# Patient Record
Sex: Male | Born: 1955 | Race: Black or African American | Hispanic: No | Marital: Married | Smoking: Never smoker
Health system: Southern US, Community
[De-identification: ages and names within clinical notes are randomized; demographics above are authoritative.]

## PROBLEM LIST (undated history)

## (undated) DIAGNOSIS — M1990 Unspecified osteoarthritis, unspecified site: Secondary | ICD-10-CM

## (undated) DIAGNOSIS — N138 Other obstructive and reflux uropathy: Secondary | ICD-10-CM

## (undated) DIAGNOSIS — E785 Hyperlipidemia, unspecified: Secondary | ICD-10-CM

## (undated) DIAGNOSIS — F32A Depression, unspecified: Secondary | ICD-10-CM

## (undated) DIAGNOSIS — Z87442 Personal history of urinary calculi: Secondary | ICD-10-CM

## (undated) DIAGNOSIS — F419 Anxiety disorder, unspecified: Secondary | ICD-10-CM

## (undated) DIAGNOSIS — N200 Calculus of kidney: Secondary | ICD-10-CM

## (undated) DIAGNOSIS — I10 Essential (primary) hypertension: Secondary | ICD-10-CM

## (undated) DIAGNOSIS — K219 Gastro-esophageal reflux disease without esophagitis: Secondary | ICD-10-CM

## (undated) DIAGNOSIS — G4733 Obstructive sleep apnea (adult) (pediatric): Secondary | ICD-10-CM

## (undated) DIAGNOSIS — I700 Atherosclerosis of aorta: Secondary | ICD-10-CM

## (undated) DIAGNOSIS — N401 Enlarged prostate with lower urinary tract symptoms: Principal | ICD-10-CM

## (undated) DIAGNOSIS — N529 Male erectile dysfunction, unspecified: Secondary | ICD-10-CM

## (undated) DIAGNOSIS — N400 Enlarged prostate without lower urinary tract symptoms: Secondary | ICD-10-CM

## (undated) DIAGNOSIS — G4730 Sleep apnea, unspecified: Secondary | ICD-10-CM

## (undated) DIAGNOSIS — G43909 Migraine, unspecified, not intractable, without status migrainosus: Secondary | ICD-10-CM

## (undated) HISTORY — DX: Benign prostatic hyperplasia with lower urinary tract symptoms: N40.1

## (undated) HISTORY — DX: Unspecified osteoarthritis, unspecified site: M19.90

## (undated) HISTORY — PX: POLYPECTOMY: SHX149

## (undated) HISTORY — DX: Other obstructive and reflux uropathy: N13.8

## (undated) HISTORY — DX: Benign prostatic hyperplasia without lower urinary tract symptoms: N40.0

## (undated) HISTORY — PX: TONSILLECTOMY: SUR1361

## (undated) HISTORY — PX: HERNIA REPAIR: SHX51

---

## 1998-07-26 HISTORY — PX: COLONOSCOPY: SHX174

## 2007-09-06 ENCOUNTER — Ambulatory Visit: Payer: Self-pay

## 2009-03-27 ENCOUNTER — Ambulatory Visit: Payer: Self-pay | Admitting: Family Medicine

## 2010-03-19 ENCOUNTER — Ambulatory Visit: Payer: Self-pay | Admitting: Family Medicine

## 2012-06-14 ENCOUNTER — Ambulatory Visit: Payer: Self-pay | Admitting: General Surgery

## 2012-06-15 LAB — PATHOLOGY REPORT

## 2013-02-06 ENCOUNTER — Encounter: Payer: Self-pay | Admitting: *Deleted

## 2013-10-01 ENCOUNTER — Ambulatory Visit: Payer: Self-pay | Admitting: Family Medicine

## 2015-01-09 ENCOUNTER — Encounter: Payer: Self-pay | Admitting: Family Medicine

## 2015-01-13 ENCOUNTER — Ambulatory Visit (INDEPENDENT_AMBULATORY_CARE_PROVIDER_SITE_OTHER): Payer: BLUE CROSS/BLUE SHIELD | Admitting: Family Medicine

## 2015-01-13 ENCOUNTER — Encounter: Payer: Self-pay | Admitting: Family Medicine

## 2015-01-13 VITALS — BP 118/80 | HR 71 | Temp 98.1°F | Resp 16 | Ht 67.0 in | Wt 189.4 lb

## 2015-01-13 DIAGNOSIS — Z0000 Encounter for general adult medical examination without abnormal findings: Secondary | ICD-10-CM

## 2015-01-13 DIAGNOSIS — N400 Enlarged prostate without lower urinary tract symptoms: Secondary | ICD-10-CM

## 2015-01-13 DIAGNOSIS — Z713 Dietary counseling and surveillance: Secondary | ICD-10-CM | POA: Diagnosis not present

## 2015-01-13 HISTORY — DX: Benign prostatic hyperplasia without lower urinary tract symptoms: N40.0

## 2015-01-13 MED ORDER — TAMSULOSIN HCL 0.4 MG PO CAPS: 0.4000 mg | ORAL_CAPSULE | ORAL | Status: DC

## 2015-01-13 MED ORDER — CIPROFLOXACIN HCL 500 MG PO TABS: 500.0000 mg | ORAL_TABLET | Freq: Two times a day (BID) | ORAL | Status: DC

## 2015-01-13 NOTE — Patient Instructions (Signed)
Exercise to Lose Weight Exercise and a healthy diet may help you lose weight. Your doctor may suggest specific exercises. EXERCISE IDEAS AND TIPS  Choose low-cost things you enjoy doing, such as walking, bicycling, or exercising to workout videos.  Take stairs instead of the elevator.  Walk during your lunch break.  Park your car further away from work or school.  Go to a gym or an exercise class.  Start with 5 to 10 minutes of exercise each day. Build up to 30 minutes of exercise 4 to 6 days a week.  Wear shoes with good support and comfortable clothes.  Stretch before and after working out.  Work out until you breathe harder and your heart beats faster.  Drink extra water when you exercise.  Do not do so much that you hurt yourself, feel dizzy, or get very short of breath. Exercises that burn about 150 calories:  Running 1  miles in 15 minutes.  Playing volleyball for 45 to 60 minutes.  Washing and waxing a car for 45 to 60 minutes.  Playing touch football for 45 minutes.  Walking 1  miles in 35 minutes.  Pushing a stroller 1  miles in 30 minutes.  Playing basketball for 30 minutes.  Raking leaves for 30 minutes.  Bicycling 5 miles in 30 minutes.  Walking 2 miles in 30 minutes.  Dancing for 30 minutes.  Shoveling snow for 15 minutes.  Swimming laps for 20 minutes.  Walking up stairs for 15 minutes.  Bicycling 4 miles in 15 minutes.  Gardening for 30 to 45 minutes.  Jumping rope for 15 minutes.  Washing windows or floors for 45 to 60 minutes. Document Released: 08/14/2010 Document Revised: 10/04/2011 Document Reviewed: 08/14/2010 ExitCare Patient Information 2015 [employer]. This information is not intended to replace advice given to you by your health care provider. Make sure you discuss any questions you have with your health care provider.  

## 2015-01-13 NOTE — Progress Notes (Signed)
Name: Daniel Garner   MRN: [id number]    DOB: [birth year]   Date:01/13/2015       Progress Note  Subjective  Chief Complaint  Chief Complaint  Patient presents with  . Annual Exam    HPI  59 year old male presents for annual H&P. He has tomorrow arthritic changes but otherwise unremarkable  Past Medical History  Diagnosis Date  . Special screening for malignant neoplasms, colon   . Arthritis     History  Substance Use Topics  . Smoking status: Never Smoker   . Smokeless tobacco: Never Used  . Alcohol Use: No     Current outpatient prescriptions:  .  tamsulosin (FLOMAX) 0.4 MG CAPS capsule, , Disp: , Rfl:   No Known Allergies  Review of Systems  Constitutional: Negative for fever, chills and weight loss.  HENT: Negative for congestion, hearing loss, sore throat and tinnitus.   Eyes: Negative for blurred vision, double vision and redness.  Respiratory: Negative for cough, hemoptysis and shortness of breath.   Cardiovascular: Negative for chest pain, palpitations, orthopnea, claudication and leg swelling.  Gastrointestinal: Negative for heartburn, nausea, vomiting, diarrhea, constipation and blood in stool.  Genitourinary: Negative for dysuria, urgency, frequency and hematuria.  Musculoskeletal: Negative for myalgias, back pain, joint pain, falls and neck pain.  Skin: Negative for itching.  Neurological: Negative for dizziness, tingling, tremors, focal weakness, seizures, loss of consciousness, weakness and headaches.  Endo/Heme/Allergies: Does not bruise/bleed easily.  Psychiatric/Behavioral: Negative for depression and substance abuse. The patient is not nervous/anxious and does not have insomnia.      Objective  Filed Vitals:   01/13/15 1529  BP: 118/80  Pulse: 71  Temp: 98.1 F (36.7 C)  TempSrc: Oral  Resp: 16  Height: 5\' 7"  (1.702 m)  Weight: 189 lb 6.4 oz (85.911 kg)  SpO2: 99%     Physical Exam  Constitutional: He is oriented to person, place,  and time and well-developed, well-nourished, and in no distress.  HENT:  Head: Normocephalic.  Eyes: EOM are normal. Pupils are equal, round, and reactive to light.  Neck: Normal range of motion. Neck supple. No thyromegaly present.  Cardiovascular: Normal rate, regular rhythm and normal heart sounds.   No murmur heard. Pulmonary/Chest: Effort normal and breath sounds normal. No respiratory distress. He has no wheezes.  Abdominal: Soft. Bowel sounds are normal.  Genitourinary: Prostate is enlarged and tender.  Musculoskeletal: Normal range of motion. He exhibits no edema.  Lymphadenopathy:    He has no cervical adenopathy.  Neurological: He is alert and oriented to person, place, and time. No cranial nerve deficit. Gait normal. Coordination normal.  Skin: Skin is warm and dry. No rash noted.  Psychiatric: Affect and judgment normal.      Assessment & Plan  1. Annual physical exam  - Comprehensive metabolic panel - Lipid panel - TSH - CBC with Differential - PSA  2. Dietary counseling Patient handout is given  3. Prostatism  - ciprofloxacin (CIPRO) 500 MG tablet; Take 1 tablet (500 mg total) by mouth 2 (two) times daily.  Dispense: 20 tablet; Refill: 0 - tamsulosin (FLOMAX) 0.4 MG CAPS capsule; Take 1 capsule (0.4 mg total) by mouth 1 day or 1 dose.  Dispense: 30 capsule; Refill: 5

## 2015-01-14 ENCOUNTER — Encounter: Payer: Self-pay | Admitting: Family Medicine

## 2015-01-21 ENCOUNTER — Telehealth: Payer: Self-pay | Admitting: Family Medicine

## 2015-01-21 NOTE — Telephone Encounter (Signed)
Pt tried to take Tamsulosin prescription to [facility] and they would not fill it. Told him that the directions on the prescription was wrong. Please call pharmacy

## 2015-01-23 NOTE — Telephone Encounter (Signed)
Patient got script straight with [facility]

## 2015-01-30 ENCOUNTER — Telehealth: Payer: Self-pay | Admitting: Family Medicine

## 2015-01-30 DIAGNOSIS — N400 Enlarged prostate without lower urinary tract symptoms: Secondary | ICD-10-CM

## 2015-01-30 MED ORDER — CIPROFLOXACIN HCL 500 MG PO TABS: 500.0000 mg | ORAL_TABLET | Freq: Two times a day (BID) | ORAL | Status: DC

## 2015-01-30 NOTE — Telephone Encounter (Signed)
ERRENOUS °

## 2015-01-30 NOTE — Telephone Encounter (Signed)
Requesting refill on Citrofloxacn 500mg . Please call once completed.

## 2015-01-30 NOTE — Telephone Encounter (Signed)
Sent refill to pt pharmacy that is on file pt aware.

## 2015-05-22 ENCOUNTER — Ambulatory Visit: Payer: Self-pay | Admitting: General Surgery

## 2015-05-30 ENCOUNTER — Ambulatory Visit: Payer: BLUE CROSS/BLUE SHIELD | Admitting: Family Medicine

## 2015-06-10 ENCOUNTER — Encounter: Payer: Self-pay | Admitting: General Surgery

## 2015-06-10 ENCOUNTER — Ambulatory Visit (INDEPENDENT_AMBULATORY_CARE_PROVIDER_SITE_OTHER): Payer: BLUE CROSS/BLUE SHIELD | Admitting: General Surgery

## 2015-06-10 VITALS — BP 124/76 | HR 74 | Resp 12 | Ht 70.0 in | Wt 191.0 lb

## 2015-06-10 DIAGNOSIS — Z86010 Personal history of colonic polyps: Secondary | ICD-10-CM

## 2015-06-10 DIAGNOSIS — Z1211 Encounter for screening for malignant neoplasm of colon: Secondary | ICD-10-CM | POA: Diagnosis not present

## 2015-06-10 MED ORDER — POLYETHYLENE GLYCOL 3350 17 GM/SCOOP PO POWD: 1.0000 | Freq: Once | ORAL | Status: DC

## 2015-06-10 NOTE — Patient Instructions (Addendum)
Colonoscopy A colonoscopy is an exam to look at the entire large intestine (colon). This exam can help find problems such as tumors, polyps, inflammation, and areas of bleeding. The exam takes about 1 hour.  LET [facility] CARE PROVIDER KNOW ABOUT:   Any allergies you have.  All medicines you are taking, including vitamins, herbs, eye drops, creams, and over-the-counter medicines.  Previous problems you or members of your family have had with the use of anesthetics.  Any blood disorders you have.  Previous surgeries you have had.  Medical conditions you have. RISKS AND COMPLICATIONS  Generally, this is a safe procedure. However, as with any procedure, complications can occur. Possible complications include:  Bleeding.  Tearing or rupture of the colon wall.  Reaction to medicines given during the exam.  Infection (rare). BEFORE THE PROCEDURE   Ask your health care provider about changing or stopping your regular medicines.  You may be prescribed an oral bowel prep. This involves drinking a large amount of medicated liquid, starting the day before your procedure. The liquid will cause you to have multiple loose stools until your stool is almost clear or light green. This cleans out your colon in preparation for the procedure.  Do not eat or drink anything else once you have started the bowel prep, unless your health care provider tells you it is safe to do so.  Arrange for someone to drive you home after the procedure. PROCEDURE   You will be given medicine to help you relax (sedative).  You will lie on your side with your knees bent.  A long, flexible tube with a light and camera on the end (colonoscope) will be inserted through the rectum and into the colon. The camera sends video back to a computer screen as it moves through the colon. The colonoscope also releases carbon dioxide gas to inflate the colon. This helps your health care provider see the area better.  During  the exam, your health care provider may take a small tissue sample (biopsy) to be examined under a microscope if any abnormalities are found.  The exam is finished when the entire colon has been viewed. AFTER THE PROCEDURE   Do not drive for 24 hours after the exam.  You may have a small amount of blood in your stool.  You may pass moderate amounts of gas and have mild abdominal cramping or bloating. This is caused by the gas used to inflate your colon during the exam.  Ask when your test results will be ready and how you will get your results. Make sure you get your test results.   This information is not intended to replace advice given to you by your health care provider. Make sure you discuss any questions you have with your health care provider.   Document Released: 07/09/2000 Document Revised: 05/02/2013 Document Reviewed: 03/19/2013 Elsevier Interactive Patient Education [employer].  Patient is scheduled for a colonoscopy at [facility] on 07/23/15. He is aware to stop his Fish Oil one week prior. Miralax prescription has been sent into his pharmacy. Patient is aware of date and instructions.

## 2015-06-10 NOTE — Progress Notes (Addendum)
Patient ID: Daniel Garner, male   DOB: 03-26-1956, 59 y.o.   MRN: [id number]  Chief Complaint  Patient presents with  . Colonoscopy    HPI Daniel Garner is a 59 y.o. male here today for a evaluation of a colonoscopy. Last one was done on 05/14/12. No GI problems at this time. At the time of last colonoscopy, 2 polyps were found and only one was retrieved. 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  I have reviewed the history of present illness with the patient. HPI  Past Medical History  Diagnosis Date  . Special screening for malignant neoplasms, colon   . Arthritis     Past Surgical History  Procedure Laterality Date  . Hernia repair  [id number]  . Colonoscopy  2000  . Tonsillectomy      Family History  Problem Relation Age of Onset  . Hypertension Mother     Social History Social History  Substance Use Topics  . Smoking status: Never Smoker   . Smokeless tobacco: Never Used  . Alcohol Use: No    No Known Allergies  Current Outpatient Prescriptions  Medication Sig Dispense Refill  . Fish Oil OIL 1 capsule by Does not apply route daily.    . tamsulosin (FLOMAX) 0.4 MG CAPS capsule Take 1 capsule (0.4 mg total) by mouth 1 day or 1 dose. 30 capsule 5  . polyethylene glycol powder (GLYCOLAX/MIRALAX) powder Take 255 g by mouth once. 255 g 0   No current facility-administered medications for this visit.    Review of Systems Review of Systems  Constitutional: Negative.   Respiratory: Negative.   Cardiovascular: Negative.     Blood pressure 124/76, pulse 74, resp. rate 12, height 5\' 10"  (1.778 m), weight 191 lb (86.637 kg).  Physical Exam Physical Exam  Constitutional: He is oriented to person, place, and time. He appears well-developed and well-nourished.  HENT:  Head: Normocephalic.  Eyes: Conjunctivae are normal.  Neck: Neck supple.  Cardiovascular: Normal rate, regular rhythm and normal heart sounds.   Pulmonary/Chest: Effort normal and breath  sounds normal.  Abdominal: Soft. Bowel sounds are normal. He exhibits no distension and no mass. There is no tenderness.  Neurological: He is alert and oriented to person, place, and time.  Skin: Skin is warm and dry.  Psychiatric: He has a normal mood and affect. His behavior is normal.    Data Reviewed Notes reviewed  Assessment    Stable exam, hx of colon polyps      Plan    Colonoscopy with possible biopsy/polypectomy prn: Information regarding the procedure, including its potential risks and complications (including but not limited to perforation of the bowel, which may require emergency surgery to repair, and bleeding) was verbally given to the patient. Educational information regarding lower intestinal endoscopy was given to the patient. Written instructions for how to complete the bowel prep using Miralax were provided. The importance of drinking ample fluids to avoid dehydration as a result of the prep emphasized.     Patient is scheduled for a colonoscopy at [facility] on 07/23/15. He is aware to stop his Fish Oil one week prior. Miralax prescription has been sent into his pharmacy. Patient is aware of date and instructions.   PCP:  Kathlene November 06/11/2015, 11:39 AM

## 2015-06-11 ENCOUNTER — Encounter: Payer: Self-pay | Admitting: General Surgery

## 2015-06-12 ENCOUNTER — Ambulatory Visit (INDEPENDENT_AMBULATORY_CARE_PROVIDER_SITE_OTHER): Payer: BLUE CROSS/BLUE SHIELD | Admitting: Family Medicine

## 2015-06-12 ENCOUNTER — Encounter: Payer: Self-pay | Admitting: Family Medicine

## 2015-06-12 VITALS — BP 124/78 | HR 82 | Temp 98.3°F | Resp 18 | Ht 70.0 in | Wt 193.1 lb

## 2015-06-12 DIAGNOSIS — N410 Acute prostatitis: Secondary | ICD-10-CM | POA: Diagnosis not present

## 2015-06-12 DIAGNOSIS — N138 Other obstructive and reflux uropathy: Secondary | ICD-10-CM

## 2015-06-12 DIAGNOSIS — N401 Enlarged prostate with lower urinary tract symptoms: Secondary | ICD-10-CM

## 2015-06-12 MED ORDER — CIPROFLOXACIN HCL 500 MG PO TABS: 500.0000 mg | ORAL_TABLET | Freq: Two times a day (BID) | ORAL | Status: DC

## 2015-06-12 NOTE — Progress Notes (Signed)
Name: Daniel Garner   MRN: [id number]    DOB: 12-May-1956   Date:06/12/2015       Progress Note  Subjective  Chief Complaint  Chief Complaint  Patient presents with  . Benign Prostatic Hypertrophy    HPI  Prostatitis and BPH  Patient has a history over several months of recurrent episodes of prostatitis with lower urinary tracts symptoms. He is convinced her course of antibiotics in the past and also has been placed on Flomax. He also now is experiencing some episodes of decreased ejaculation or failure to ejaculate with intercourse and orgasm. He has nocturia 2. There's been no hematuria  Past Medical History  Diagnosis Date  . Special screening for malignant neoplasms, colon   . Arthritis     Social History  Substance Use Topics  . Smoking status: Never Smoker   . Smokeless tobacco: Never Used  . Alcohol Use: No     Current outpatient prescriptions:  .  Fish Oil OIL, 1 capsule by Does not apply route daily., Disp: , Rfl:  .  polyethylene glycol powder (GLYCOLAX/MIRALAX) powder, Take 255 g by mouth once., Disp: 255 g, Rfl: 0 .  tamsulosin (FLOMAX) 0.4 MG CAPS capsule, Take 1 capsule (0.4 mg total) by mouth 1 day or 1 dose., Disp: 30 capsule, Rfl: 5  No Known Allergies  Review of Systems  Constitutional: Negative.   HENT: Negative.   Cardiovascular: Negative.   Genitourinary: Positive for dysuria, urgency and frequency. Negative for hematuria.       Ejaculatory problems  Skin: Negative.      Objective  Filed Vitals:   06/12/15 1433  BP: 124/78  Pulse: 82  Temp: 98.3 F (36.8 C)  Resp: 18  Height: 5\' 10"  (1.778 m)  Weight: 193 lb 1 oz (87.573 kg)  SpO2: 96%     Physical Exam  Constitutional: He is well-developed, well-nourished, and in no distress.  HENT:  Head: Normocephalic.  Eyes: Pupils are equal, round, and reactive to light.      Assessment & Plan  1. Acute prostatitis No bordering on becoming chronic chronic - POCT Urinalysis  Dipstick - Ambulatory referral to Urology - Urine Culture  2. BPH with obstruction/lower urinary tract symptoms Refer urologist continue Flomax

## 2015-06-14 LAB — URINE CULTURE: Organism ID, Bacteria: NO GROWTH

## 2015-06-15 DIAGNOSIS — N138 Other obstructive and reflux uropathy: Secondary | ICD-10-CM | POA: Insufficient documentation

## 2015-06-15 DIAGNOSIS — N401 Enlarged prostate with lower urinary tract symptoms: Secondary | ICD-10-CM

## 2015-06-15 HISTORY — DX: Other obstructive and reflux uropathy: N13.8

## 2015-06-15 HISTORY — DX: Benign prostatic hyperplasia with lower urinary tract symptoms: N40.1

## 2015-06-17 ENCOUNTER — Encounter: Payer: Self-pay | Admitting: Urology

## 2015-06-17 ENCOUNTER — Ambulatory Visit: Payer: BLUE CROSS/BLUE SHIELD | Admitting: Urology

## 2015-07-09 ENCOUNTER — Encounter: Payer: Self-pay | Admitting: Urology

## 2015-07-09 ENCOUNTER — Ambulatory Visit (INDEPENDENT_AMBULATORY_CARE_PROVIDER_SITE_OTHER): Payer: BLUE CROSS/BLUE SHIELD | Admitting: Urology

## 2015-07-09 VITALS — BP 129/81 | HR 80 | Ht 70.0 in | Wt 188.1 lb

## 2015-07-09 DIAGNOSIS — N401 Enlarged prostate with lower urinary tract symptoms: Secondary | ICD-10-CM

## 2015-07-09 DIAGNOSIS — N138 Other obstructive and reflux uropathy: Secondary | ICD-10-CM

## 2015-07-09 LAB — URINALYSIS, COMPLETE
BILIRUBIN UA: NEGATIVE
Glucose, UA: NEGATIVE
KETONES UA: NEGATIVE
LEUKOCYTES UA: NEGATIVE
NITRITE UA: NEGATIVE
PH UA: 5.5 (ref 5.0–7.5)
Protein, UA: NEGATIVE
RBC UA: NEGATIVE
Specific Gravity, UA: >1.03 — ABNORMAL HIGH (ref 1.005–1.030)
Urobilinogen, Ur: 1 mg/dL (ref 0.2–1.0)

## 2015-07-09 LAB — MICROSCOPIC EXAMINATION
BACTERIA UA: NONE SEEN
Epithelial Cells (non renal): NONE SEEN /hpf (ref 0–10)

## 2015-07-09 LAB — BLADDER SCAN AMB NON-IMAGING: Scan Result: 24

## 2015-07-09 MED ORDER — SILODOSIN 8 MG PO CAPS: 8.0000 mg | ORAL_CAPSULE | Freq: Every day | ORAL | Status: DC

## 2015-07-09 MED ORDER — SOLIFENACIN SUCCINATE 5 MG PO TABS
5.0000 mg | ORAL_TABLET | Freq: Every day | ORAL | Status: DC
Start: 2015-07-09 — End: 2015-12-04

## 2015-07-09 NOTE — Progress Notes (Signed)
07/09/2015 3:48 PM   Daniel Garner 03-04-1956 [id number]  Referring provider: Ashok Norris, MD [address] [place], [place] [address]  Chief Complaint  Patient presents with  . New Patient (Initial Visit)    acute prostatitis     HPI:  The patient is a 59 year old gentleman the past medical history that includes acute prostatitis and BPH.   The patient complains of incomplete emptying, frequency twice per hour, intermittency, and weak stream. He also is nocturia 2. He has been on Flomax for  3 months.   Over the last 6 months, he has been on 3 cycles of antibiotics for 2 weeks. He feels this and improved his symptoms more than Flomax. He notes his symptoms worsen when he stops antibiotics. His IPSS is 16/4.  He also notes that over the last year his ejaculate volume has decreased. He is still able to maintain erection orgasm, but he feels the volume is decreased. He feels the volume began decreasing before starting the Flomax.  His PVR today is 21.  PMH: Past Medical History  Diagnosis Date  . Special screening for malignant neoplasms, colon   . Arthritis   . 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 Prostatism 01/13/2015  . Annual physical exam 01/13/2015    Surgical History: Past Surgical History  Procedure Laterality Date  . Hernia repair  [id number]  . Colonoscopy  2000  . Tonsillectomy      Home Medications:    Medication List       This list is accurate as of: 07/09/15  3:48 PM.  Always use your most recent med list.               ciprofloxacin 500 MG tablet  Commonly known as:  CIPRO  Take 1 tablet (500 mg total) by mouth 2 (two) times daily.     Fish Oil Oil  1 capsule by Does not apply route daily.     ibuprofen 800 MG tablet  Commonly known as:  ADVIL,MOTRIN  800 mg     oxyCODONE-acetaminophen 5-325 MG tablet  Commonly known as:  PERCOCET/ROXICET  Reported on 07/09/2015     polyethylene glycol powder  powder  Commonly known as:  GLYCOLAX/MIRALAX  Take 255 g by mouth once.     silodosin 8 MG Caps capsule  Commonly known as:  RAPAFLO  Take 1 capsule (8 mg total) by mouth daily with breakfast.     solifenacin 5 MG tablet  Commonly known as:  VESICARE  Take 1 tablet (5 mg total) by mouth daily.     tamsulosin 0.4 MG Caps capsule  Commonly known as:  FLOMAX  Take 1 capsule (0.4 mg total) by mouth 1 day or 1 dose.        Allergies: No Known Allergies  Family History: Family History  Problem Relation Age of Onset  . Hypertension Mother   . Prostate cancer Father     Social History:  reports that he has never smoked. He has never used smokeless tobacco. He reports that he does not drink alcohol or use illicit drugs.  ROS: UROLOGY Frequent Urination?: Yes Hard to postpone urination?: No Burning/pain with urination?: No Get up at night to urinate?: Yes Leakage of urine?: No Urine stream starts and stops?: Yes Trouble starting stream?: Yes Do you have to strain to urinate?: No Blood in urine?: No Urinary tract infection?: No Sexually transmitted disease?: No Injury to kidneys or bladder?: No Painful intercourse?:  No Weak stream?: Yes Erection problems?: Yes Penile pain?: No  Gastrointestinal Nausea?: No Vomiting?: No Indigestion/heartburn?: No Diarrhea?: No Constipation?: No  Constitutional Fever: No Night sweats?: Yes Weight loss?: No Fatigue?: Yes  Skin Skin rash/lesions?: No Itching?: No  Eyes Blurred vision?: No Double vision?: No  Ears/Nose/Throat Sore throat?: No Sinus problems?: Yes  Hematologic/Lymphatic Swollen glands?: No Easy bruising?: No  Cardiovascular Leg swelling?: No Chest pain?: No  Respiratory Cough?: No Shortness of breath?: No  Endocrine Excessive thirst?: No  Musculoskeletal Back pain?: No Joint pain?: No  Neurological Headaches?: No Dizziness?: No  Psychologic Depression?: No Anxiety?: No  Physical  Exam: BP 129/81 mmHg  Pulse 80  Ht 5\' 10"  (1.778 m)  Wt 188 lb 1.6 oz (85.322 kg)  BMI 26.99 kg/m2  Constitutional:  Alert and oriented, No acute distress. HEENT: [place] AT, moist mucus membranes.  Trachea midline, no masses. Cardiovascular: No clubbing, cyanosis, or edema. Respiratory: Normal respiratory effort, no increased work of breathing. GI: Abdomen is soft, nontender, nondistended, no abdominal masses GU: No CVA tenderness.  Normal phallus. Testicles and equal bilaterally. No masses. DRE: 2+ , smooth, no nodules. Nontender to palpation. Skin: No rashes, bruises or suspicious lesions. Lymph: No cervical or inguinal adenopathy. Neurologic: Grossly intact, no focal deficits, moving all 4 extremities. Psychiatric: Normal mood and affect.  Laboratory Data: No results found for: WBC, HGB, HCT, MCV, PLT  No results found for: CREATININE  No results found for: PSA  No results found for: TESTOSTERONE  No results found for: HGBA1C  Urinalysis No results found for: COLORURINE, APPEARANCEUR, LABSPEC, PHURINE, GLUCOSEU, HGBUR, BILIRUBINUR, KETONESUR, PROTEINUR, UROBILINOGEN, NITRITE, LEUKOCYTESUR   Assessment & Plan:   1. BPH with obstruction/lower urinary tract symptoms  We will stop the patient's Flomax at this time As is the likely cause of his decreased ejaculate down. We switched him to Rapaflo 8 mg to see if this improves his sperm volume. He was given samples and prescription was sent to his pharmacy.  2. Overactive bladder   The patient also has significant frequency and a feeling of incomplete emptying. He was given samples of Vesicare 5 mg daily. We will also call a prescription in to his pharmacy.  3. Decreased ejaculate volume  This is likely secondary to the Flomax.  4.  PSA screening The patient was diagnosed recently with acute prostatitis by his primary care doctor. He has no symptoms of acute prostatitis at this time, but.we will hold off on screening his PSA now  as it may be falsely elevated from his recent prostatitis.  Return in about 3 months (around 10/07/2015).  Nickie Retort, MD  [facility] [address] [place], [place] [address] [phone]

## 2015-07-09 NOTE — Progress Notes (Signed)
Bladder Scan Patient : 24 ml Performed By: Larna Daughters

## 2015-08-06 ENCOUNTER — Ambulatory Visit: Payer: BLUE CROSS/BLUE SHIELD | Admitting: Certified Registered Nurse Anesthetist

## 2015-08-06 ENCOUNTER — Encounter: Payer: Self-pay | Admitting: *Deleted

## 2015-08-06 ENCOUNTER — Ambulatory Visit
Admission: RE | Admit: 2015-08-06 | Discharge: 2015-08-06 | Disposition: A | Discharge status: Home / Self Care | Payer: BLUE CROSS/BLUE SHIELD | Source: Ambulatory Visit | Attending: General Surgery | Admitting: General Surgery

## 2015-08-06 ENCOUNTER — Encounter
Admission: RE | Disposition: A | Discharge status: Home / Self Care | Payer: Self-pay | Source: Ambulatory Visit | Attending: General Surgery

## 2015-08-06 ENCOUNTER — Telehealth: Payer: Self-pay | Admitting: Urology

## 2015-08-06 DIAGNOSIS — M1990 Unspecified osteoarthritis, unspecified site: Secondary | ICD-10-CM | POA: Insufficient documentation

## 2015-08-06 DIAGNOSIS — N400 Enlarged prostate without lower urinary tract symptoms: Secondary | ICD-10-CM | POA: Insufficient documentation

## 2015-08-06 DIAGNOSIS — K621 Rectal polyp: Secondary | ICD-10-CM | POA: Insufficient documentation

## 2015-08-06 DIAGNOSIS — Z8042 Family history of malignant neoplasm of prostate: Secondary | ICD-10-CM | POA: Insufficient documentation

## 2015-08-06 DIAGNOSIS — Z79899 Other long term (current) drug therapy: Secondary | ICD-10-CM | POA: Diagnosis not present

## 2015-08-06 DIAGNOSIS — Z86010 Personal history of colonic polyps: Secondary | ICD-10-CM | POA: Diagnosis present

## 2015-08-06 DIAGNOSIS — Z1211 Encounter for screening for malignant neoplasm of colon: Secondary | ICD-10-CM | POA: Diagnosis present

## 2015-08-06 HISTORY — DX: Sleep apnea, unspecified: G47.30

## 2015-08-06 HISTORY — PX: COLONOSCOPY WITH PROPOFOL: SHX5780

## 2015-08-06 SURGERY — COLONOSCOPY WITH PROPOFOL
Anesthesia: General

## 2015-08-06 MED ORDER — PROPOFOL 500 MG/50ML IV EMUL
INTRAVENOUS | Status: DC | PRN
Start: 2015-08-06 — End: 2015-08-06
  Administered 2015-08-06: 140 ug/kg/min via INTRAVENOUS

## 2015-08-06 MED ORDER — LIDOCAINE HCL (CARDIAC) 20 MG/ML IV SOLN
INTRAVENOUS | Status: DC | PRN
  Administered 2015-08-06: 100 mg via INTRAVENOUS

## 2015-08-06 MED ORDER — FENTANYL CITRATE (PF) 100 MCG/2ML IJ SOLN: 25.0000 ug | INTRAMUSCULAR | Status: DC | PRN

## 2015-08-06 MED ORDER — SODIUM CHLORIDE 0.9 % IV SOLN
INTRAVENOUS | Status: DC | PRN
  Administered 2015-08-06: 08:00:00 via INTRAVENOUS

## 2015-08-06 MED ORDER — PROPOFOL 10 MG/ML IV BOLUS
INTRAVENOUS | Status: DC | PRN
  Administered 2015-08-06 (×3): 20 mg via INTRAVENOUS

## 2015-08-06 MED ORDER — MIDAZOLAM HCL 2 MG/2ML IJ SOLN
INTRAMUSCULAR | Status: DC | PRN
  Administered 2015-08-06: 1 mg via INTRAVENOUS

## 2015-08-06 MED ORDER — ONDANSETRON HCL 4 MG/2ML IJ SOLN: 4.0000 mg | Freq: Once | INTRAMUSCULAR | Status: DC | PRN

## 2015-08-06 NOTE — Interval H&P Note (Signed)
History and Physical Interval Note:  08/06/2015 9:14 AM  Daniel Garner  has presented today for surgery, with the diagnosis of HX COLON POLYPS  The various methods of treatment have been discussed with the patient and family. After consideration of risks, benefits and other options for treatment, the patient has consented to  Procedure(s): COLONOSCOPY WITH PROPOFOL (N/A) as a surgical intervention .  The patient's history has been reviewed, patient examined, no change in status, stable for surgery.  I have reviewed the patient's chart and labs.  Questions were answered to the patient's satisfaction.     Daniel Garner G

## 2015-08-06 NOTE — Anesthesia Postprocedure Evaluation (Signed)
Anesthesia Post Note  Patient: Daniel Garner  Procedure(s) Performed: Procedure(s) (LRB): COLONOSCOPY WITH PROPOFOL (N/A)  Patient location during evaluation: PACU Anesthesia Type: General Level of consciousness: awake and alert and oriented Pain management: pain level controlled Vital Signs Assessment: post-procedure vital signs reviewed and stable Respiratory status: spontaneous breathing Cardiovascular status: blood pressure returned to baseline Anesthetic complications: no    Last Vitals:  Filed Vitals:   08/06/15 1015 08/06/15 1025  BP: 109/68 101/75  Pulse: 63 54  Temp:    Resp: 13 14    Last Pain: There were no vitals filed for this visit.               Arik Husmann

## 2015-08-06 NOTE — Telephone Encounter (Signed)
Pt went to [facility] on [address] to pick up his Vesicare.  [facility] told him that he needs authorization for the generic Vesicare in order for him to have it filled.  Pt stopped by our office to talk with the nurse regarding this and also was asking for samples to have until his RX was cleared.  Please give pt a call to let him know when the authorization has been completed.

## 2015-08-06 NOTE — Transfer of Care (Signed)
Immediate Anesthesia Transfer of Care Note  Patient: Daniel Garner  Procedure(s) Performed: Procedure(s): COLONOSCOPY WITH PROPOFOL (N/A)  Patient Location: PACU  Anesthesia Type:General  Level of Consciousness: sedated  Airway & Oxygen Therapy: Patient Spontanous Breathing and Patient connected to nasal cannula oxygen  Post-op Assessment: Report given to RN and Post -op Vital signs reviewed and stable  Post vital signs: Reviewed and stable  Last Vitals:  Filed Vitals:   08/06/15 0827 08/06/15 0955  BP: 129/77 90/62  Pulse: 66 65  Temp: 35.9 C 36.1 C  Resp: 20 15    Complications: No apparent anesthesia complications

## 2015-08-06 NOTE — Op Note (Signed)
[facility] Patient Name: Daniel Garner Procedure Date: 08/06/2015 9:13 AM MRN: [id number] Account #: [id number] Date of Birth: 1956/05/19 Admit Type: Outpatient Age: 60 Room: [facility] ROOM 1 Gender: Male Note Status: Finalized Procedure:         Colonoscopy Indications:       High risk colon cancer surveillance: Personal history of                     colonic polyps Providers:         Mirenda Baltazar G. Jamal Collin, MD Referring MD:      Ashok Norris, MD (Referring MD) Medicines:         General Anesthesia Complications:     No immediate complications. Procedure:         Pre-Anesthesia Assessment:                    - General anesthesia under the supervision of an                     anesthesiologist was determined to be medically necessary                     for this procedure based on review of the patient's                     medical history, medications, and prior anesthesia history.                    After obtaining informed consent, the colonoscope was                     passed under direct vision. Throughout the procedure, the                     patient's blood pressure, pulse, and oxygen saturations                     were monitored continuously. The Olympus CF-H180AL                     colonoscope ( S#: [id number] ) was introduced through the                     anus and advanced to the the cecum, identified by the                     ileocecal valve. The colonoscopy was performed without                     difficulty. The patient tolerated the procedure well. The                     quality of the bowel preparation was good. Findings:      The perianal and digital rectal examinations were normal.      A 3 mm benign appearing polyp was found in the rectum. The polyp was       sessile. The polyp was removed with a cold biopsy forceps. Resection and       retrieval were complete.      The exam was otherwise without abnormality on direct  and retroflexion       views. Impression:        - One benign appearing 3 mm polyp in the rectum. Resected  and retrieved.                    - The examination was otherwise normal on direct and                     retroflexion views. Recommendation:    - Repeat colonoscopy in 5 years for surveillance. Procedure Code(s): --- Professional ---                    [phone], Colonoscopy, flexible; with biopsy, single or                     multiple Diagnosis Code(s): --- Professional ---                    Z86.010, Personal history of colonic polyps                    K62.1, Rectal polyp CPT copyright 2014 [employer]. All rights reserved. The codes documented in this report are preliminary and upon coder review may  be revised to meet current compliance requirements. Christene Lye, MD 08/06/2015 9:51:46 AM This report has been signed electronically. Number of Addenda: 0 Note Initiated On: 08/06/2015 9:13 AM Scope Withdrawal Time: 0 hours 10 minutes 33 seconds  Total Procedure Duration: 0 hours 24 minutes 6 seconds       [facility]

## 2015-08-06 NOTE — Anesthesia Procedure Notes (Signed)
Date/Time: 08/06/2015 9:20 AM Performed by: Johnna Acosta Pre-anesthesia Checklist: Patient identified, Emergency Drugs available, Suction available, Patient being monitored and Timeout performed Patient Re-evaluated:Patient Re-evaluated prior to inductionOxygen Delivery Method: Nasal cannula

## 2015-08-06 NOTE — H&P (Signed)
Daniel Garner is an 60 y.o. male.   Chief Complaint: pt here for scheduled colonoscopy HPI: 60yr old male with history of colon polyps. Here for surveillance colonoscopy. No GI complaints. Please see prior OV note from Nov 2016. He reports no changes in his meds and allergies.   Past Medical History  Diagnosis Date  . Special screening for malignant neoplasms, colon   . Arthritis   . 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 Prostatism 01/13/2015  . Annual physical exam 01/13/2015  . Sleep apnea     Past Surgical History  Procedure Laterality Date  . Hernia repair  [id number]  . Colonoscopy  2000  . Tonsillectomy      Family History  Problem Relation Age of Onset  . Hypertension Mother   . Prostate cancer Father    Social History:  reports that he has never smoked. He has never used smokeless tobacco. He reports that he does not drink alcohol or use illicit drugs.  Allergies: No Known Allergies  Medications Prior to Admission  Medication Sig Dispense Refill  . ciprofloxacin (CIPRO) 500 MG tablet Take 1 tablet (500 mg total) by mouth 2 (two) times daily. (Patient not taking: Reported on 07/09/2015) 28 tablet 0  . Fish Oil OIL 1 capsule by Does not apply route daily.    Marland Kitchen ibuprofen (ADVIL,MOTRIN) 800 MG tablet 800 mg    . oxyCODONE-acetaminophen (PERCOCET/ROXICET) 5-325 MG tablet Reported on 07/09/2015    . polyethylene glycol powder (GLYCOLAX/MIRALAX) powder Take 255 g by mouth once. (Patient not taking: Reported on 07/09/2015) 255 g 0  . silodosin (RAPAFLO) 8 MG CAPS capsule Take 1 capsule (8 mg total) by mouth daily with breakfast. 30 capsule 11  . solifenacin (VESICARE) 5 MG tablet Take 1 tablet (5 mg total) by mouth daily. 30 tablet 11  . tamsulosin (FLOMAX) 0.4 MG CAPS capsule Take 1 capsule (0.4 mg total) by mouth 1 day or 1 dose. (Patient not taking: Reported on 08/06/2015) 30 capsule 5    No results found for this or any previous visit (from the  past 48 hour(s)). No results found.  Review of Systems  Constitutional: Negative.   HENT: Negative.   Respiratory: Negative.   Cardiovascular: Negative.   Gastrointestinal: Negative.   Genitourinary: Negative.     Blood pressure 129/77, pulse 66, temperature 96.7 F (35.9 C), temperature source Tympanic, resp. rate 20, height 5\' 10"  (1.778 m), weight 185 lb (83.915 kg), SpO2 100 %. Physical Exam  Constitutional: He appears well-developed and well-nourished.  Eyes: Conjunctivae are normal. No scleral icterus.  Neck: Neck supple.  Cardiovascular: Normal rate, regular rhythm and normal heart sounds.   Respiratory: Effort normal and breath sounds normal.  GI: Soft. Bowel sounds are normal. There is no hepatomegaly. There is no tenderness. No hernia.     Assessment/Plan Personal history of colon polyps. Colonoscopy with possible biopsy/polypectomy prn: Information regarding the procedure, including its potential risks and complications (including but not limited to perforation of the bowel, which may require emergency surgery to repair, and bleeding) was verbally given to the patient.    [facility] G 08/06/2015, 9:10 AM

## 2015-08-06 NOTE — Telephone Encounter (Signed)
Attempted to call [facility] on [address]. And there was no answer.

## 2015-08-06 NOTE — Anesthesia Preprocedure Evaluation (Signed)
Anesthesia Evaluation  Patient identified by MRN, date of birth, ID band Patient awake    Reviewed: Allergy & Precautions, NPO status , Patient's Chart, lab work & pertinent test results  Airway Mallampati: III  TM Distance: <3 FB Neck ROM: Full    Dental  (+) Chipped   Pulmonary sleep apnea ,    Pulmonary exam normal breath sounds clear to auscultation       Cardiovascular negative cardio ROS Normal cardiovascular exam     Neuro/Psych negative neurological ROS  negative psych ROS   GI/Hepatic negative GI ROS, Neg liver ROS,   Endo/Other  negative endocrine ROS  Renal/GU negative Renal ROS  negative genitourinary   Musculoskeletal  (+) Arthritis , Osteoarthritis,    Abdominal Normal abdominal exam  (+)   Peds negative pediatric ROS (+)  Hematology negative hematology ROS (+)   Anesthesia Other Findings   Reproductive/Obstetrics                             Anesthesia Physical Anesthesia Plan  ASA: II  Anesthesia Plan: General   Post-op Pain Management:    Induction: Intravenous  Airway Management Planned: Nasal Cannula  Additional Equipment:   Intra-op Plan:   Post-operative Plan:   Informed Consent: I have reviewed the patients History and Physical, chart, labs and discussed the procedure including the risks, benefits and alternatives for the proposed anesthesia with the patient or authorized representative who has indicated his/her understanding and acceptance.   Dental advisory given  Plan Discussed with: CRNA and Surgeon  Anesthesia Plan Comments:         Anesthesia Quick Evaluation

## 2015-08-07 ENCOUNTER — Encounter: Payer: Self-pay | Admitting: General Surgery

## 2015-08-07 LAB — SURGICAL PATHOLOGY

## 2015-08-15 NOTE — Telephone Encounter (Signed)
Completed PA for vesicare.

## 2015-08-26 ENCOUNTER — Encounter: Payer: Self-pay | Admitting: General Surgery

## 2015-08-28 ENCOUNTER — Telehealth: Payer: Self-pay

## 2015-08-28 DIAGNOSIS — N3281 Overactive bladder: Secondary | ICD-10-CM

## 2015-08-28 NOTE — Telephone Encounter (Signed)
Pt called stating he spoke with you out in the hall when he came to get vesicare samples-insurance will not pay for vesicare. Pt stated you said you would call in a different medication. Medication was not called in. Please advise.

## 2015-08-28 NOTE — Telephone Encounter (Signed)
We can try ditropan xl 10 mg once daily. thanks

## 2015-08-29 MED ORDER — OXYBUTYNIN CHLORIDE ER 10 MG PO TB24: 10.0000 mg | ORAL_TABLET | Freq: Every day | ORAL | Status: DC

## 2015-08-29 NOTE — Telephone Encounter (Signed)
LMOM-a new medication sent to pharmacy.

## 2015-09-11 ENCOUNTER — Ambulatory Visit (INDEPENDENT_AMBULATORY_CARE_PROVIDER_SITE_OTHER): Payer: BLUE CROSS/BLUE SHIELD | Admitting: Urology

## 2015-09-11 VITALS — Ht 70.0 in | Wt 192.0 lb

## 2015-09-11 DIAGNOSIS — Z125 Encounter for screening for malignant neoplasm of prostate: Secondary | ICD-10-CM | POA: Diagnosis not present

## 2015-09-11 DIAGNOSIS — N401 Enlarged prostate with lower urinary tract symptoms: Secondary | ICD-10-CM | POA: Diagnosis not present

## 2015-09-11 DIAGNOSIS — R868 Other abnormal findings in specimens from male genital organs: Secondary | ICD-10-CM

## 2015-09-11 DIAGNOSIS — N538 Other male sexual dysfunction: Secondary | ICD-10-CM

## 2015-09-11 DIAGNOSIS — N138 Other obstructive and reflux uropathy: Secondary | ICD-10-CM

## 2015-09-11 DIAGNOSIS — N3281 Overactive bladder: Secondary | ICD-10-CM | POA: Diagnosis not present

## 2015-09-11 LAB — BLADDER SCAN AMB NON-IMAGING

## 2015-09-11 NOTE — Progress Notes (Signed)
09/11/2015 12:01 PM   Daniel Garner 10-24-1955 [id number]  Referring provider: Ashok Norris, MD [address] [place], [place] [address]  Chief Complaint  Patient presents with  . Benign Prostatic Hypertrophy    follow up    HPI: The patient is a 60 year old gentleman the past medical history that includes acute prostatitis and BPH. The patient complains of incomplete emptying, frequency twice per hour, intermittency, and weak stream. He also is nocturia 2. He has been on Flomax for 3 months. Over the last 6 months, he has been on 3 cycles of antibiotics for 2 weeks. He feels this and improved his symptoms more than Flomax. He notes his symptoms worsen when he stops antibiotics. His IPSS is 16/4. He also notes that over the last year his ejaculate volume has decreased. He is still able to maintain erection orgasm, but he feels the volume is decreased. He feels the volume began decreasing before starting the Flomax. His PVR today is 21.  DRE was 2+, benign.  February 2017 interval history: The patient was started on Rapaflo and Vesicare at his last appointment. However, his insurance does not pay for the Vesicare so he was started on Ditropan XL. The patient's main concern again today is decreased sperm volume. The patient was switched from Flomax to Rapaflo and his last appointment hoping that this will help him. His base complaint is that when he does not ejaculate that he is able to region orgasm but he has pain in his right testicle. He states when he does ejaculate when he feels a normal mouth does not have this pain and experiences relief. He finds is very bothersome. His urinary symptoms are also bothersome but not as bothersome. His I PSS score today is 16/4. He has issues with incomplete emptying, frequency, intermittency, urgency, weak stream, and nocturia 2. He is mostly satisfied with his symptoms. However again today he is more concerned with his low  ejaculate.  PVR: 0 cc PMH: Past Medical History  Diagnosis Date  . Special screening for malignant neoplasms, colon   . Arthritis   . 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 Prostatism 01/13/2015  . Annual physical exam 01/13/2015  . Sleep apnea     Surgical History: Past Surgical History  Procedure Laterality Date  . Hernia repair  [id number]  . Colonoscopy  2000  . Tonsillectomy    . Colonoscopy with propofol N/A 08/06/2015    Procedure: COLONOSCOPY WITH PROPOFOL;  Surgeon: Christene Lye, MD;  Location: [facility] ENDOSCOPY;  Service: Endoscopy;  Laterality: N/A;    Home Medications:    Medication List       This list is accurate as of: 09/11/15 12:01 PM.  Always use your most recent med list.               Fish Oil Oil  1 capsule by Does not apply route daily.     ibuprofen 800 MG tablet  Commonly known as:  ADVIL,MOTRIN  800 mg     oxybutynin 10 MG 24 hr tablet  Commonly known as:  DITROPAN-XL  Take 1 tablet (10 mg total) by mouth daily.     silodosin 8 MG Caps capsule  Commonly known as:  RAPAFLO  Take 1 capsule (8 mg total) by mouth daily with breakfast.     solifenacin 5 MG tablet  Commonly known as:  VESICARE  Take 1 tablet (5 mg total) by mouth daily.  Allergies: No Known Allergies  Family History: Family History  Problem Relation Age of Onset  . Hypertension Mother   . Prostate cancer Father     Social History:  reports that he has never smoked. He has never used smokeless tobacco. He reports that he does not drink alcohol or use illicit drugs.  ROS: UROLOGY Frequent Urination?: No Hard to postpone urination?: No Burning/pain with urination?: No Get up at night to urinate?: No Leakage of urine?: No Urine stream starts and stops?: No Trouble starting stream?: No Do you have to strain to urinate?: No Blood in urine?: No Urinary tract infection?: No Sexually transmitted disease?: No Injury to kidneys or  bladder?: No Painful intercourse?: No Weak stream?: No Erection problems?: No Penile pain?: No  Gastrointestinal Nausea?: No Vomiting?: No Indigestion/heartburn?: No Diarrhea?: No Constipation?: No  Constitutional Fever: No Night sweats?: No Weight loss?: No Fatigue?: Yes  Skin Skin rash/lesions?: No Itching?: No  Eyes Blurred vision?: No Double vision?: No  Ears/Nose/Throat Sore throat?: No Sinus problems?: Yes  Hematologic/Lymphatic Swollen glands?: No Easy bruising?: No  Cardiovascular Leg swelling?: No Chest pain?: No  Respiratory Cough?: No Shortness of breath?: No  Endocrine Excessive thirst?: No  Musculoskeletal Back pain?: No Joint pain?: No  Neurological Headaches?: No Dizziness?: No  Psychologic Depression?: No Anxiety?: No  Physical Exam: Ht 5\' 10"  (1.778 m)  Wt 192 lb (87.091 kg)  BMI 27.55 kg/m2  Constitutional:  Alert and oriented, No acute distress. HEENT: [place] AT, moist mucus membranes.  Trachea midline, no masses. Cardiovascular: No clubbing, cyanosis, or edema. Respiratory: Normal respiratory effort, no increased work of breathing. GI: Abdomen is soft, nontender, nondistended, no abdominal masses GU: No CVA tenderness. Normal phallus. Testicles descended equal bilaterally. Nontender palpation. No masses. Skin: No rashes, bruises or suspicious lesions. Lymph: No cervical or inguinal adenopathy. Neurologic: Grossly intact, no focal deficits, moving all 4 extremities. Psychiatric: Normal mood and affect.  Laboratory Data: No results found for: WBC, HGB, HCT, MCV, PLT  No results found for: CREATININE  No results found for: PSA  No results found for: TESTOSTERONE  No results found for: HGBA1C  Urinalysis    Component Value Date/Time   GLUCOSEU Negative 07/09/2015 1503   BILIRUBINUR Negative 07/09/2015 1503   NITRITE Negative 07/09/2015 1503   LEUKOCYTESUR Negative 07/09/2015 1503    Assessment & Plan:    1. BPH  with obstruction/lower urinary tract symptoms The patient will stop his Rapaflo due to his significant concern with decreased ejaculate volume. He was warned that his urinary symptoms may worsen. If this were to occur, he was instructed to restart his Rapaflo.  2. Overactive bladder -continue ditropan XL for significant urgency and frequency in the setting of an empty bladder  3. Decreased ejaculate volume This is likely secondary to the rapaflo as above  4. PSA screening The patient was diagnosed recently with acute prostatitis by his primary care doctor. He has no symptoms of acute prostatitis at this time, but.we will hold off on screening his PSA now as it may be falsely elevated from his recent prostatitis.  Consider checking PSA at next appointment   Return in about 3 months (around 12/09/2015).  Nickie Retort, MD  [facility] [address], [place], [place] [address] [phone]

## 2015-09-19 ENCOUNTER — Ambulatory Visit: Payer: BLUE CROSS/BLUE SHIELD

## 2015-11-06 ENCOUNTER — Ambulatory Visit: Payer: BLUE CROSS/BLUE SHIELD

## 2015-12-04 ENCOUNTER — Ambulatory Visit (INDEPENDENT_AMBULATORY_CARE_PROVIDER_SITE_OTHER): Payer: BLUE CROSS/BLUE SHIELD | Admitting: Family Medicine

## 2015-12-04 ENCOUNTER — Encounter: Payer: Self-pay | Admitting: Family Medicine

## 2015-12-04 VITALS — BP 122/78 | HR 79 | Temp 97.5°F | Resp 16 | Ht 70.0 in | Wt 195.8 lb

## 2015-12-04 DIAGNOSIS — M25561 Pain in right knee: Secondary | ICD-10-CM | POA: Diagnosis not present

## 2015-12-04 MED ORDER — ASPIRIN EC 81 MG PO TBEC: 81.0000 mg | DELAYED_RELEASE_TABLET | Freq: Every day | ORAL | Status: DC

## 2015-12-04 MED ORDER — DICLOFENAC SODIUM 75 MG PO TBEC: 75.0000 mg | DELAYED_RELEASE_TABLET | Freq: Two times a day (BID) | ORAL | Status: DC

## 2015-12-04 NOTE — Progress Notes (Signed)
Name: Daniel Garner   MRN: [id number]    DOB: 1955/12/30   Date:12/04/2015       Progress Note  Subjective  Chief Complaint  Chief Complaint  Patient presents with  . Knee Pain    patient presents with intermittent right knee pain that started about 2-3 months ago. patient stated that it has some slight swelling and pressure. patient stated that the pain is predominantly the left lateral side of the patella. patient has tried otc ibuprofen and elevation at night.    HPI  Right knee pain: he likes to run and usually runs three times weekly either 3 miles outdoors or at a faster pace 2 miles treadmill. About 2 months ago he woke up with pain on right anterior knee and had a mild effusion. He has been taking ibuprofen occasionally. He stopped running, but has been walking and lifting weights. 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 He still has an antalgic gait. Pain is mild and intermittent now, but aggravated by activity.   Patient Active Problem List   Diagnosis Date Noted  . 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 Prostatism 01/13/2015    Past Surgical History  Procedure Laterality Date  . Hernia repair  [id number]  . Colonoscopy  2000  . Tonsillectomy    . Colonoscopy with propofol N/A 08/06/2015    Procedure: COLONOSCOPY WITH PROPOFOL;  Surgeon: Christene Lye, MD;  Location: [facility] ENDOSCOPY;  Service: Endoscopy;  Laterality: N/A;    Family History  Problem Relation Age of Onset  . Hypertension Mother   . Prostate cancer Father     Social History   Social History  . Marital Status: Married    Spouse Name: N/A  . Number of Children: N/A  . Years of Education: N/A   Occupational History  . Not on file.   Social History Main Topics  . Smoking status: Never Smoker   . Smokeless tobacco: Never Used  . Alcohol Use: No  . Drug Use: No  . Sexual  Activity:    Partners: Female   Other Topics Concern  . Not on file   Social History Narrative     Current outpatient prescriptions:  .  Cholecalciferol (VITAMIN D-3) 1000 units CAPS, Take by mouth., Disp: , Rfl:  .  cyanocobalamin 1000 MCG tablet, Take 1,000 mcg by mouth daily., Disp: , Rfl:  .  Fish Oil OIL, 1 capsule by Does not apply route daily., Disp: , Rfl:  .  vitamin C (ASCORBIC ACID) 500 MG tablet, Take 500 mg by mouth daily., Disp: , Rfl:  .  vitamin E 400 UNIT capsule, Take 400 Units by mouth 2 (two) times daily., Disp: , Rfl:  .  aspirin EC 81 MG tablet, Take 1 tablet (81 mg total) by mouth daily., Disp: 30 tablet, Rfl: 0 .  diclofenac (VOLTAREN) 75 MG EC tablet, Take 1 tablet (75 mg total) by mouth 2 (two) times daily., Disp: 60 tablet, Rfl: 0  No Known Allergies   ROS  Ten systems reviewed and is negative except as mentioned in HPI  Objective  Filed Vitals:   12/04/15 1149  BP: 122/78  Pulse: 79  Temp: 97.5 F (36.4 C)  TempSrc: Oral  Resp: 16  Height: 5\' 10"  (1.778 m)  Weight: 195 lb 12.8 oz (88.814 kg)  SpO2: 97%    Body mass index  is 28.09 kg/(m^2).  Physical Exam  Constitutional: Patient appears well-developed and well-nourished.  No distress.  HEENT: head atraumatic, normocephalic, pupils equal and reactive to light,  neck supple, throat within normal limits Cardiovascular: Normal rate, regular rhythm and normal heart sounds.  No murmur heard. No BLE edema. Pulmonary/Chest: Effort normal and breath sounds normal. No respiratory distress. Abdominal: Soft.  There is no tenderness. Psychiatric: Patient has a normal mood and affect. behavior is normal. Judgment and thought content normal. Muscular skeletal: mild crepitus with extension of left knee, normal right knee exam, mild antalgic gait.   Recent Results (from the past 2160 hour(s))  BLADDER SCAN AMB NON-IMAGING     Status: None   Collection Time: 09/11/15 11:31 AM  Result Value Ref Range    Scan Result 36ml      PHQ2/9: Depression screen PHQ 2/9 12/04/2015  Decreased Interest 0  Down, Depressed, Hopeless 0  PHQ - 2 Score 0    Fall Risk: Fall Risk  12/04/2015  Falls in the past year? No    Functional Status Survey: Is the patient deaf or have difficulty hearing?: No Does the patient have difficulty seeing, even when wearing glasses/contacts?: No Does the patient have difficulty concentrating, remembering, or making decisions?: No Does the patient have difficulty walking or climbing stairs?: No Does the patient have difficulty dressing or bathing?: No Does the patient have difficulty doing errands alone such as visiting a doctor's office or shopping?: No    Assessment & Plan  1. Right knee pain  We will try nsaid's, and avoid high impact activities of lower extremity. He can still walk, use ice on knee at night, and try medication, if no improvement call back for referral to Ortho - diclofenac (VOLTAREN) 75 MG EC tablet; Take 1 tablet (75 mg total) by mouth 2 (two) times daily.  Dispense: 60 tablet; Refill: 0

## 2016-02-16 DIAGNOSIS — G4733 Obstructive sleep apnea (adult) (pediatric): Secondary | ICD-10-CM | POA: Diagnosis not present

## 2016-03-05 DIAGNOSIS — G4733 Obstructive sleep apnea (adult) (pediatric): Secondary | ICD-10-CM | POA: Diagnosis not present

## 2016-03-25 DIAGNOSIS — G4733 Obstructive sleep apnea (adult) (pediatric): Secondary | ICD-10-CM | POA: Diagnosis not present

## 2016-05-19 DIAGNOSIS — G4733 Obstructive sleep apnea (adult) (pediatric): Secondary | ICD-10-CM | POA: Diagnosis not present

## 2016-08-16 ENCOUNTER — Encounter: Payer: Self-pay | Admitting: Family Medicine

## 2016-08-16 ENCOUNTER — Ambulatory Visit (INDEPENDENT_AMBULATORY_CARE_PROVIDER_SITE_OTHER): Payer: BLUE CROSS/BLUE SHIELD | Admitting: Family Medicine

## 2016-08-16 VITALS — BP 124/78 | HR 91 | Temp 97.9°F | Resp 16 | Ht 70.0 in | Wt 205.6 lb

## 2016-08-16 DIAGNOSIS — M25561 Pain in right knee: Secondary | ICD-10-CM

## 2016-08-16 DIAGNOSIS — Z125 Encounter for screening for malignant neoplasm of prostate: Secondary | ICD-10-CM

## 2016-08-16 DIAGNOSIS — E663 Overweight: Secondary | ICD-10-CM | POA: Diagnosis not present

## 2016-08-16 DIAGNOSIS — K219 Gastro-esophageal reflux disease without esophagitis: Secondary | ICD-10-CM | POA: Diagnosis not present

## 2016-08-16 DIAGNOSIS — Z131 Encounter for screening for diabetes mellitus: Secondary | ICD-10-CM | POA: Diagnosis not present

## 2016-08-16 DIAGNOSIS — G8929 Other chronic pain: Secondary | ICD-10-CM

## 2016-08-16 DIAGNOSIS — M94262 Chondromalacia, left knee: Secondary | ICD-10-CM | POA: Diagnosis not present

## 2016-08-16 DIAGNOSIS — R5383 Other fatigue: Secondary | ICD-10-CM

## 2016-08-16 DIAGNOSIS — Z1159 Encounter for screening for other viral diseases: Secondary | ICD-10-CM

## 2016-08-16 DIAGNOSIS — N401 Enlarged prostate with lower urinary tract symptoms: Secondary | ICD-10-CM

## 2016-08-16 DIAGNOSIS — Z23 Encounter for immunization: Secondary | ICD-10-CM

## 2016-08-16 DIAGNOSIS — G4733 Obstructive sleep apnea (adult) (pediatric): Secondary | ICD-10-CM

## 2016-08-16 DIAGNOSIS — M1990 Unspecified osteoarthritis, unspecified site: Secondary | ICD-10-CM | POA: Insufficient documentation

## 2016-08-16 DIAGNOSIS — Z13220 Encounter for screening for lipoid disorders: Secondary | ICD-10-CM

## 2016-08-16 DIAGNOSIS — G4730 Sleep apnea, unspecified: Secondary | ICD-10-CM | POA: Insufficient documentation

## 2016-08-16 DIAGNOSIS — N529 Male erectile dysfunction, unspecified: Secondary | ICD-10-CM | POA: Insufficient documentation

## 2016-08-16 DIAGNOSIS — Z8669 Personal history of other diseases of the nervous system and sense organs: Secondary | ICD-10-CM | POA: Insufficient documentation

## 2016-08-16 DIAGNOSIS — N138 Other obstructive and reflux uropathy: Secondary | ICD-10-CM

## 2016-08-16 MED ORDER — RANITIDINE HCL 150 MG PO TABS: 150.0000 mg | ORAL_TABLET | Freq: Two times a day (BID) | ORAL | 2 refills | Status: DC

## 2016-08-16 NOTE — Progress Notes (Signed)
Name: Daniel Garner   MRN: [id number]    DOB: 12/12/1955   Date:08/16/2016       Progress Note  Subjective  Chief Complaint  Chief Complaint  Patient presents with  . Knee Pain    Onset-6 months, patient has had a MRI and was told he has Arthritis, but had a severe attack last night. He has slowly started walking and running again.  . Abdominal Pain    Onset-3 weeks ago, Uses CPAP machine at night and feels like it was from not cleaning it for a couple of days, nausea at times, feels gassy    HPI   Right knee pain: he likes to run and usually runs three times weekly either 3 miles outdoors or at a faster pace 2 miles treadmill, however 9 months ago he woke up with pain on right anterior knee and had a mild effusion. He has been taking ibuprofen occasionally. He stopped running for a period of time and pain improved, however he tried to run again and symptoms returned.  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 He responded to Voltaren when he was taking it.  Pain is intermittent, pain is only triggered by activity such as walking or after he runs, and also with cold weather.   ED: doing well, he takes otc supplements occasionally, libido is back to normal   BPH and prostatitis: he has seen urologist in the past.  OSA: he is wearing his CPAP machine every night and no longer has headaches.   Fatigue: he has some fatigue, he states it seems to be related to the lack of physical activity  GERD: he has a personal history of gastritis, and over the past few weeks he has noticed intermittent indigestion and heartburn, associated with some nausea and feeling bloated at times. No blood in stools or change in bowel movements. He likes spicy food and seems to be triggered by eating fast.    Patient Active Problem List   Diagnosis Date Noted  . 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 Sleep apnea  08/16/2016  . Osteoarthritis 08/16/2016  . History of migraine 08/16/2016  . ED (erectile dysfunction) 08/16/2016  . GERD without esophagitis 08/16/2016  . 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 Prostatism 01/13/2015    Past Surgical History:  Procedure Laterality Date  . COLONOSCOPY  2000  . COLONOSCOPY WITH PROPOFOL N/A 08/06/2015   Procedure: COLONOSCOPY WITH PROPOFOL;  Surgeon: Christene Lye, MD;  Location: [facility] ENDOSCOPY;  Service: Endoscopy;  Laterality: N/A;  . HERNIA REPAIR  [id number]  . TONSILLECTOMY      Family History  Problem Relation Age of Onset  . Hypertension Mother   . Prostate cancer Father     Social History   Social History  . Marital status: Married    Spouse name: N/A  . Number of children: N/A  . Years of education: N/A   Occupational History  . Not on file.   Social History Main Topics  . Smoking status: Never Smoker  . Smokeless tobacco: Never Used  . Alcohol use No  . Drug use: No  . Sexual activity: Yes    Partners: Female   Other Topics Concern  . Not on file   Social History Narrative  . No narrative on file     Current Outpatient Prescriptions:  .  Cholecalciferol (VITAMIN D-3)  1000 units CAPS, Take by mouth., Disp: , Rfl:  .  cyanocobalamin 1000 MCG tablet, Take 1,000 mcg by mouth daily., Disp: , Rfl:  .  Fish Oil OIL, 1 capsule by Does not apply route daily., Disp: , Rfl:  .  vitamin C (ASCORBIC ACID) 500 MG tablet, Take 500 mg by mouth daily., Disp: , Rfl:  .  vitamin E 400 UNIT capsule, Take 400 Units by mouth 2 (two) times daily., Disp: , Rfl:  .  aspirin EC 81 MG tablet, Take 1 tablet (81 mg total) by mouth daily. (Patient not taking: Reported on 08/16/2016), Disp: 30 tablet, Rfl: 0  No Known Allergies   ROS  Constitutional: Negative for fever or weight change.  Respiratory: Negative for cough and shortness of breath.   Cardiovascular: Negative for chest pain or palpitations.   Gastrointestinal: Negative for abdominal pain, no bowel changes.  Musculoskeletal: Negative for gait problem, positive for intermittent right knee  joint swelling.  Skin: Negative for rash.  Neurological: Negative for dizziness or headache.  No other specific complaints in a complete review of systems (except as listed in HPI above).  Objective  Vitals:   08/16/16 1153  BP: 124/78  Pulse: 91  Resp: 16  Temp: 97.9 F (36.6 C)  TempSrc: Oral  SpO2: 95%  Weight: 205 lb 9.6 oz (93.3 kg)  Height: 5\' 10"  (1.778 m)    Body mass index is 29.5 kg/m.  Physical Exam  Constitutional: Patient appears well-developed and well-nourished.  No distress.  HEENT: head atraumatic, normocephalic, pupils equal and reactive to light, neck supple, throat within normal limits Cardiovascular: Normal rate, regular rhythm and normal heart sounds.  No murmur heard. No BLE edema. Pulmonary/Chest: Effort normal and breath sounds normal. No respiratory distress. Abdominal: Soft.  There is no tenderness. Psychiatric: Patient has a normal mood and affect. behavior is normal. Judgment and thought content normal. Muscular Skeletal: normal rom of both knees, no effusion  PHQ2/9: Depression screen [facility] 2/9 08/16/2016 12/04/2015  Decreased Interest 0 0  Down, Depressed, Hopeless 0 0  PHQ - 2 Score 0 0     Fall Risk: Fall Risk  08/16/2016 12/04/2015  Falls in the past year? No No     Functional Status Survey: Is the patient deaf or have difficulty hearing?: No Does the patient have difficulty seeing, even when wearing glasses/contacts?: No Does the patient have difficulty concentrating, remembering, or making decisions?: No Does the patient have difficulty walking or climbing stairs?: No Does the patient have difficulty dressing or bathing?: No Does the patient have difficulty doing errands alone such as visiting a doctor's office or shopping?: No   Assessment & Plan  1. Chronic pain of right  knee  - Ambulatory referral to Orthopedic Surgery  2. Chondromalacia of knee, left  No problems with left knee at this time  3. BPH with obstruction/lower urinary tract symptoms  Recheck labs  4. Overweight (BMI 25.0-29.9)  He has not been able to run and gained 11 lbs since last visit , about 6 months ago, he will try to resume physical activity ( maybe cycling )  5. Encounter for hepatitis C screening test for low risk patient  - Hepatitis C antibody  6. Prostate cancer screening  - PSA  7. Diabetes mellitus screening  - Hemoglobin A1c  8. Lipid screening  - Lipid panel  9. Other fatigue  - COMPLETE METABOLIC PANEL WITH GFR - TSH - CBC with Differential/Platelet - Vitamin B12 - VITAMIN  D 25 Hydroxy (Vit-D Deficiency, Fractures)  10. Obstructive sleep apnea syndrome  Continue CPAP every night   11. GERD without esophagitis  Previous history of gastritis, we will try Ranitidine - ranitidine (ZANTAC) 150 MG tablet; Take 1 tablet (150 mg total) by mouth 2 (two) times daily.  Dispense: 60 tablet; Refill: 2  12. Needs flu shot  - Flu Vaccine QUAD 36+ mos IM

## 2016-08-16 NOTE — Patient Instructions (Signed)
Food Choices for Gastroesophageal Reflux Disease, Adult When you have gastroesophageal reflux disease (GERD), the foods you eat and your eating habits are very important. Choosing the right foods can help ease the discomfort of GERD. What general guidelines do I need to follow?  Choose fruits, vegetables, whole grains, low-fat dairy products, and low-fat meat, fish, and poultry.  Limit fats such as oils, salad dressings, butter, nuts, and avocado.  Keep a food diary to identify foods that cause symptoms.  Avoid foods that cause reflux. These may be different for different people.  Eat frequent small meals instead of three large meals each day.  Eat your meals slowly, in a relaxed setting.  Limit fried foods.  Cook foods using methods other than frying.  Avoid drinking alcohol.  Avoid drinking large amounts of liquids with your meals.  Avoid bending over or lying down until 2-3 hours after eating. What foods are not recommended? The following are some foods and drinks that may worsen your symptoms: Vegetables  Tomatoes. Tomato juice. Tomato and spaghetti sauce. Chili peppers. Onion and garlic. Horseradish. Fruits  Oranges, grapefruit, and lemon (fruit and juice). Meats  High-fat meats, fish, and poultry. This includes hot dogs, ribs, ham, sausage, salami, and bacon. Dairy  Whole milk and chocolate milk. Sour cream. Cream. Butter. Ice cream. Cream cheese. Beverages  Coffee and tea, with or without caffeine. Carbonated beverages or energy drinks. Condiments  Hot sauce. Barbecue sauce. Sweets/Desserts  Chocolate and cocoa. Donuts. Peppermint and spearmint. Fats and Oils  High-fat foods, including [place] fries and potato chips. Other  Vinegar. Strong spices, such as black pepper, white pepper, red pepper, cayenne, curry powder, cloves, ginger, and chili powder. The items listed above may not be a complete list of foods and beverages to avoid. Contact your dietitian for more  information.  This information is not intended to replace advice given to you by your health care provider. Make sure you discuss any questions you have with your health care provider. Document Released: 07/12/2005 Document Revised: 12/18/2015 Document Reviewed: 05/16/2013 Elsevier Interactive Patient Education  2017 [employer].

## 2016-08-18 LAB — CBC WITH DIFFERENTIAL/PLATELET
BASOS PCT: 0 %
Basophils Absolute: 0 cells/uL (ref 0–200)
EOS ABS: 47 {cells}/uL (ref 15–500)
Eosinophils Relative: 1 %
HCT: 43.8 % (ref 38.5–50.0)
HEMOGLOBIN: 14.5 g/dL (ref 13.2–17.1)
LYMPHS ABS: 2021 {cells}/uL (ref 850–3900)
Lymphocytes Relative: 43 %
MCH: 31.2 pg (ref 27.0–33.0)
MCHC: 33.1 g/dL (ref 32.0–36.0)
MCV: 94.2 fL (ref 80.0–100.0)
MONO ABS: 376 {cells}/uL (ref 200–950)
MPV: 9.2 fL (ref 7.5–12.5)
Monocytes Relative: 8 %
Neutro Abs: 2256 cells/uL (ref 1500–7800)
Neutrophils Relative %: 48 %
Platelets: 264 10*3/uL (ref 140–400)
RBC: 4.65 MIL/uL (ref 4.20–5.80)
RDW: 14.1 % (ref 11.0–15.0)
WBC: 4.7 10*3/uL (ref 3.8–10.8)

## 2016-08-18 LAB — COMPLETE METABOLIC PANEL WITH GFR
ALBUMIN: 4.1 g/dL (ref 3.6–5.1)
ALK PHOS: 70 U/L (ref 40–115)
ALT: 24 U/L (ref 9–46)
AST: 22 U/L (ref 10–35)
BILIRUBIN TOTAL: 0.5 mg/dL (ref 0.2–1.2)
BUN: 14 mg/dL (ref 7–25)
CO2: 28 mmol/L (ref 20–31)
Calcium: 9.6 mg/dL (ref 8.6–10.3)
Chloride: 104 mmol/L (ref 98–110)
Creat: 1.13 mg/dL (ref 0.70–1.33)
GFR, EST AFRICAN AMERICAN: 82 mL/min (ref >=60)
GFR, EST NON AFRICAN AMERICAN: 71 mL/min (ref >=60)
GLUCOSE: 104 mg/dL — AB (ref 65–99)
Potassium: 5 mmol/L (ref 3.5–5.3)
Sodium: 139 mmol/L (ref 135–146)
TOTAL PROTEIN: 6.8 g/dL (ref 6.1–8.1)

## 2016-08-18 LAB — LIPID PANEL
Cholesterol: 232 mg/dL — ABNORMAL HIGH (ref <200)
HDL: 60 mg/dL (ref >40)
LDL CALC: 157 mg/dL — AB (ref <100)
TRIGLYCERIDES: 74 mg/dL (ref <150)
Total CHOL/HDL Ratio: 3.9 Ratio (ref <5.0)
VLDL: 15 mg/dL (ref <30)

## 2016-08-18 LAB — TSH: TSH: 0.99 mIU/L (ref 0.40–4.50)

## 2016-08-18 LAB — HEPATITIS C ANTIBODY: HCV Ab: NEGATIVE

## 2016-08-18 LAB — VITAMIN B12: Vitamin B-12: 897 pg/mL (ref 200–1100)

## 2016-08-18 LAB — PSA: PSA: 2.4 ng/mL (ref <=4.0)

## 2016-08-19 LAB — HEMOGLOBIN A1C
Hgb A1c MFr Bld: 5.4 % (ref <5.7)
MEAN PLASMA GLUCOSE: 108 mg/dL

## 2016-08-19 LAB — VITAMIN D 25 HYDROXY (VIT D DEFICIENCY, FRACTURES): VIT D 25 HYDROXY: 28 ng/mL — AB (ref 30–100)

## 2016-08-22 ENCOUNTER — Encounter: Payer: Self-pay | Admitting: Family Medicine

## 2016-08-22 DIAGNOSIS — E785 Hyperlipidemia, unspecified: Secondary | ICD-10-CM | POA: Insufficient documentation

## 2016-08-24 ENCOUNTER — Ambulatory Visit: Payer: BLUE CROSS/BLUE SHIELD | Admitting: Family Medicine

## 2016-09-02 DIAGNOSIS — M25551 Pain in right hip: Secondary | ICD-10-CM | POA: Diagnosis not present

## 2016-09-02 DIAGNOSIS — M1611 Unilateral primary osteoarthritis, right hip: Secondary | ICD-10-CM | POA: Diagnosis not present

## 2016-09-02 DIAGNOSIS — M25561 Pain in right knee: Secondary | ICD-10-CM | POA: Diagnosis not present

## 2016-11-03 ENCOUNTER — Encounter: Payer: BLUE CROSS/BLUE SHIELD | Admitting: Family Medicine

## 2016-11-29 DIAGNOSIS — G4733 Obstructive sleep apnea (adult) (pediatric): Secondary | ICD-10-CM | POA: Diagnosis not present

## 2017-03-17 DIAGNOSIS — G4733 Obstructive sleep apnea (adult) (pediatric): Secondary | ICD-10-CM | POA: Diagnosis not present

## 2017-05-27 ENCOUNTER — Encounter: Payer: Self-pay | Admitting: Family Medicine

## 2017-05-27 ENCOUNTER — Ambulatory Visit (INDEPENDENT_AMBULATORY_CARE_PROVIDER_SITE_OTHER): Payer: BLUE CROSS/BLUE SHIELD | Admitting: Family Medicine

## 2017-05-27 VITALS — BP 126/82 | HR 85 | Temp 98.4°F | Resp 16 | Ht 70.0 in | Wt 198.0 lb

## 2017-05-27 DIAGNOSIS — K219 Gastro-esophageal reflux disease without esophagitis: Secondary | ICD-10-CM | POA: Diagnosis not present

## 2017-05-27 DIAGNOSIS — N401 Enlarged prostate with lower urinary tract symptoms: Secondary | ICD-10-CM

## 2017-05-27 DIAGNOSIS — M160 Bilateral primary osteoarthritis of hip: Secondary | ICD-10-CM | POA: Insufficient documentation

## 2017-05-27 DIAGNOSIS — N138 Other obstructive and reflux uropathy: Secondary | ICD-10-CM

## 2017-05-27 DIAGNOSIS — G4733 Obstructive sleep apnea (adult) (pediatric): Secondary | ICD-10-CM | POA: Diagnosis not present

## 2017-05-27 DIAGNOSIS — M1611 Unilateral primary osteoarthritis, right hip: Secondary | ICD-10-CM | POA: Diagnosis not present

## 2017-05-27 NOTE — Progress Notes (Signed)
Name: Daniel Garner   MRN: [id number]    DOB: [birth year]   Date:05/27/2017       Progress Note  Subjective  Chief Complaint  Chief Complaint  Patient presents with  . Sleep Apnea    Doing well with CPAP, just needs new nasal piece. Needs paperwork filled out. Sleeps on average 6 to 6 1/2 hour nightly  . Medication Refill  . Gastroesophageal Reflux    Doing well since he has avoided eating late at night. Has not needed to take the medication.    HPI  BPH and prostatitis: he has seen urologist in the past. Not taking any medications, and states only has nocturia once per night.   OSA: he is wearing his CPAP machine every nigh for at least 6 hours, he no longer has headaches, snoring resolved, he needs new supplies.   GERD: he has a personal history of gastritis, he is doing well at this time, had to take medications in the past but currently only on life style modifications.  Right hip OA: he states pain on right knee was secondary to right hip OA, he had X-ray on knee and hip and hip OA is worse. He states pain is worse when standing or walking more, he modified his exercise, stopped running and is now using elliptical and riding his bike he is doing better, he wants to hold off on surgery  Patient Active Problem List   Diagnosis Date Noted  . Osteoarthritis of right hip 05/27/2017  . Dyslipidemia 08/22/2016  . 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 Sleep apnea 08/16/2016  . Osteoarthritis 08/16/2016  . History of migraine 08/16/2016  . ED (erectile dysfunction) 08/16/2016  . GERD without esophagitis 08/16/2016  . 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 Prostatism 01/13/2015    Past Surgical History:  Procedure Laterality Date  . COLONOSCOPY  2000  . COLONOSCOPY WITH PROPOFOL N/A 08/06/2015   Procedure: COLONOSCOPY WITH PROPOFOL;  Surgeon: Christene Lye, MD;  Location: [facility] ENDOSCOPY;  Service: Endoscopy;  Laterality: N/A;  . HERNIA  REPAIR  [phone]  . TONSILLECTOMY      Family History  Problem Relation Age of Onset  . Hypertension Mother   . Prostate cancer Father     Social History   Social History  . Marital status: Married    Spouse name: N/A  . Number of children: N/A  . Years of education: N/A   Occupational History  . Not on file.   Social History Main Topics  . Smoking status: Never Smoker  . Smokeless tobacco: Never Used  . Alcohol use No  . Drug use: No  . Sexual activity: Yes    Partners: Female   Other Topics Concern  . Not on file   Social History Narrative  . No narrative on file     Current Outpatient Prescriptions:  .  Cholecalciferol (VITAMIN D-3) 1000 units CAPS, Take by mouth., Disp: , Rfl:  .  cyanocobalamin 1000 MCG tablet, Take 1,000 mcg by mouth daily., Disp: , Rfl:  .  Fish Oil OIL, 1 capsule by Does not apply route daily., Disp: , Rfl:  .  ibuprofen (ADVIL,MOTRIN) 800 MG tablet, 800 mg, Disp: , Rfl:  .  vitamin C (ASCORBIC ACID) 500 MG tablet, Take 500 mg by mouth daily., Disp: , Rfl:  .  vitamin E 400 UNIT capsule, Take 400 Units by mouth 2 (two) times daily., Disp: , Rfl:  .  aspirin EC  81 MG tablet, Take 1 tablet (81 mg total) by mouth daily. (Patient not taking: Reported on 05/27/2017), Disp: 30 tablet, Rfl: 0 .  DOCOSAHEXAENOIC ACID PO, Use 1,000 mg once daily. , Disp: , Rfl:  .  ranitidine (ZANTAC) 150 MG tablet, Take 1 tablet (150 mg total) by mouth 2 (two) times daily. (Patient not taking: Reported on 05/27/2017), Disp: 60 tablet, Rfl: 2  No Known Allergies   ROS  Constitutional: Negative for fever or weight change.  Respiratory: Negative for cough and shortness of breath.   Cardiovascular: Negative for chest pain or palpitations.  Gastrointestinal: Negative for abdominal pain, no bowel changes.  Musculoskeletal: Positive  for gait problem occasionally but no  joint swelling.  Skin: Negative for rash.  Neurological: Negative for dizziness or headache.   No other specific complaints in a complete review of systems (except as listed in HPI above).   Objective  Vitals:   05/27/17 1448  BP: 126/82  Pulse: 85  Resp: 16  Temp: 98.4 F (36.9 C)  TempSrc: Oral  SpO2: 97%  Weight: 198 lb (89.8 kg)  Height: 5\' 10"  (1.778 m)    Body mass index is 28.41 kg/m.  Physical Exam  Constitutional: Patient appears well-developed and well-nourished. Overweight  No distress.  HEENT: head atraumatic, normocephalic, pupils equal and reactive to light,neck supple, throat within normal limits Cardiovascular: Normal rate, regular rhythm and normal heart sounds.  No murmur heard. No BLE edema. Pulmonary/Chest: Effort normal and breath sounds normal. No respiratory distress. Abdominal: Soft.  There is no tenderness. Psychiatric: Patient has a normal mood and affect. behavior is normal. Judgment and thought content normal.  PHQ2/9: Depression screen [facility] 2/9 05/27/2017 08/16/2016 12/04/2015  Decreased Interest 0 0 0  Down, Depressed, Hopeless 0 0 0  PHQ - 2 Score 0 0 0     Fall Risk: Fall Risk  05/27/2017 08/16/2016 12/04/2015  Falls in the past year? No No No     Functional Status Survey: Is the patient deaf or have difficulty hearing?: No Does the patient have difficulty seeing, even when wearing glasses/contacts?: No Does the patient have difficulty concentrating, remembering, or making decisions?: No Does the patient have difficulty walking or climbing stairs?: No Does the patient have difficulty dressing or bathing?: No Does the patient have difficulty doing errands alone such as visiting a doctor's office or shopping?: No    Assessment & Plan  1. Obstructive sleep apnea syndrome  Continue CPAP , forms filled out and faxed to [facility] today  2. GERD without esophagitis  On life style modification   3. Primary osteoarthritis of right hip  Seen at [facility], and was advised to have hip replacement when symptoms are  severe , he stopped running, modified activity , discussed importance to avoid NSAID"s and try Tylenol instead  4. BPH with obstruction/lower urinary tract symptoms  Doing well

## 2017-09-02 DIAGNOSIS — G4733 Obstructive sleep apnea (adult) (pediatric): Secondary | ICD-10-CM | POA: Diagnosis not present

## 2017-10-05 ENCOUNTER — Ambulatory Visit (INDEPENDENT_AMBULATORY_CARE_PROVIDER_SITE_OTHER): Payer: BLUE CROSS/BLUE SHIELD | Admitting: Family Medicine

## 2017-10-05 ENCOUNTER — Encounter: Payer: Self-pay | Admitting: Family Medicine

## 2017-10-05 VITALS — BP 124/78 | HR 94 | Temp 98.5°F | Resp 16 | Ht 70.0 in | Wt 201.7 lb

## 2017-10-05 DIAGNOSIS — M1611 Unilateral primary osteoarthritis, right hip: Secondary | ICD-10-CM | POA: Diagnosis not present

## 2017-10-05 DIAGNOSIS — M25561 Pain in right knee: Secondary | ICD-10-CM

## 2017-10-05 DIAGNOSIS — K219 Gastro-esophageal reflux disease without esophagitis: Secondary | ICD-10-CM | POA: Diagnosis not present

## 2017-10-05 DIAGNOSIS — G8929 Other chronic pain: Secondary | ICD-10-CM | POA: Diagnosis not present

## 2017-10-05 DIAGNOSIS — G4733 Obstructive sleep apnea (adult) (pediatric): Secondary | ICD-10-CM | POA: Diagnosis not present

## 2017-10-05 DIAGNOSIS — J301 Allergic rhinitis due to pollen: Secondary | ICD-10-CM | POA: Diagnosis not present

## 2017-10-05 MED ORDER — RANITIDINE HCL 150 MG PO TABS: 150.0000 mg | ORAL_TABLET | Freq: Two times a day (BID) | ORAL | 1 refills | Status: DC | PRN

## 2017-10-05 MED ORDER — FLUTICASONE PROPIONATE 50 MCG/ACT NA SUSP: 2.0000 | Freq: Every day | NASAL | 2 refills | Status: DC

## 2017-10-05 MED ORDER — LORATADINE 10 MG PO TABS: 10.0000 mg | ORAL_TABLET | Freq: Every day | ORAL | 1 refills | Status: DC

## 2017-10-05 NOTE — Progress Notes (Signed)
Name: Daniel Garner   MRN: [id number]    DOB: 1955/10/13   Date:10/05/2017       Progress Note  Subjective  Chief Complaint  Chief Complaint  Patient presents with  . GI Problem    Onset-3 weeks, since changing his tubing and water-since then he has been having abdominal cramping, flatulence    HPI  OSA: he is very compliant with CPAP, he states when he does not wear it at night he wakes up having headache. He no longer snores.   OA hip also right knee: he is doing well, he modified his exercise regiment, elliptical, no longer running and only takes ibuprofen prn, usually when very cold. No side effects  GERD: last year he was seen with same symptoms and responded to Ranitidine, he states a few weeks ago he noticed bloating that is worse in am, denies heartburn or regurgitation, however he took an old Ranitidine and symptoms resolved. Gas X also helped a little, no change in bowel movements, nausea or vomiting, or blood in stools.   AR: he states he has seasonal allergies, with rhinorrhea, nasal congestion but no pruritis, not on medication at this time.   Patient Active Problem List   Diagnosis Date Noted  . Osteoarthritis of right hip 05/27/2017  . Dyslipidemia 08/22/2016  . 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 Sleep apnea 08/16/2016  . Osteoarthritis 08/16/2016  . History of migraine 08/16/2016  . ED (erectile dysfunction) 08/16/2016  . GERD without esophagitis 08/16/2016  . 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 Prostatism 01/13/2015    Past Surgical History:  Procedure Laterality Date  . COLONOSCOPY  2000  . COLONOSCOPY WITH PROPOFOL N/A 08/06/2015   Procedure: COLONOSCOPY WITH PROPOFOL;  Surgeon: Christene Lye, MD;  Location: [facility] ENDOSCOPY;  Service: Endoscopy;  Laterality: N/A;  . HERNIA REPAIR  [phone]  . TONSILLECTOMY      Family History  Problem Relation Age of Onset  . Hypertension Mother   . Prostate cancer Father      Social History   Socioeconomic History  . Marital status: Married    Spouse name: Not on file  . Number of children: Not on file  . Years of education: Not on file  . Highest education level: Not on file  Social Needs  . Financial resource strain: Not on file  . Food insecurity - worry: Not on file  . Food insecurity - inability: Not on file  . Transportation needs - medical: Not on file  . Transportation needs - non-medical: Not on file  Occupational History  . Not on file  Tobacco Use  . Smoking status: Never Smoker  . Smokeless tobacco: Never Used  Substance and Sexual Activity  . Alcohol use: No    Alcohol/week: 0.0 oz  . Drug use: No  . Sexual activity: Yes    Partners: Female  Other Topics Concern  . Not on file  Social History Narrative  . Not on file     Current Outpatient Medications:  .  Cholecalciferol (VITAMIN D-3) 1000 units CAPS, Take by mouth., Disp: , Rfl:  .  cyanocobalamin 1000 MCG tablet, Take 1,000 mcg by mouth daily., Disp: , Rfl:  .  DOCOSAHEXAENOIC ACID PO, Use 1,000 mg once daily. , Disp: , Rfl:  .  Fish Oil OIL, 1 capsule by Does not apply route daily., Disp: , Rfl:  .  vitamin C (ASCORBIC ACID) 500 MG tablet, Take 500 mg by mouth  daily., Disp: , Rfl:  .  vitamin E 400 UNIT capsule, Take 400 Units by mouth 2 (two) times daily., Disp: , Rfl:  .  aspirin EC 81 MG tablet, Take 1 tablet (81 mg total) by mouth daily. (Patient not taking: Reported on 05/27/2017), Disp: 30 tablet, Rfl: 0 .  ibuprofen (ADVIL,MOTRIN) 800 MG tablet, 800 mg, Disp: , Rfl:   No Known Allergies   ROS  Constitutional: Negative for fever or weight change.  Respiratory: Negative for cough and shortness of breath.   Cardiovascular: Negative for chest pain or palpitations.  Gastrointestinal: Negative for abdominal pain, no bowel changes.  Musculoskeletal: Negative for gait problem or joint swelling.  Skin: Negative for rash.  Neurological: Negative for dizziness or  headache.  No other specific complaints in a complete review of systems (except as listed in HPI above).  Objective  Vitals:   10/05/17 1151  BP: 124/78  Pulse: 94  Resp: 16  Temp: 98.5 F (36.9 C)  TempSrc: Oral  SpO2: 98%  Weight: 201 lb 11.2 oz (91.5 kg)  Height: 5\' 10"  (1.778 m)    Body mass index is 28.94 kg/m.  Physical Exam  Constitutional: Patient appears well-developed and well-nourished. Overweight No distress.  HEENT: head atraumatic, normocephalic, pupils equal and reactive to light,neck supple, throat within normal limits Cardiovascular: Normal rate, regular rhythm and normal heart sounds.  No murmur heard. No BLE edema. Pulmonary/Chest: Effort normal and breath sounds normal. No respiratory distress. Abdominal: Soft.  There is no tenderness. Psychiatric: Patient has a normal mood and affect. behavior is normal. Judgment and thought content normal. Muscular Skeletal: normal rom of both knees, decrease rom of left hip    PHQ2/9: Depression screen [facility] 2/9 10/05/2017 05/27/2017 08/16/2016 12/04/2015  Decreased Interest 0 0 0 0  Down, Depressed, Hopeless 0 0 0 0  PHQ - 2 Score 0 0 0 0     Fall Risk: Fall Risk  10/05/2017 05/27/2017 08/16/2016 12/04/2015  Falls in the past year? No No No No     Functional Status Survey: Is the patient deaf or have difficulty hearing?: No Does the patient have difficulty seeing, even when wearing glasses/contacts?: No Does the patient have difficulty concentrating, remembering, or making decisions?: No Does the patient have difficulty walking or climbing stairs?: No Does the patient have difficulty dressing or bathing?: No Does the patient have difficulty doing errands alone such as visiting a doctor's office or shopping?: No    Assessment & Plan  1. Obstructive sleep apnea syndrome  Continue use every night   2. GERD without esophagitis  - ranitidine (ZANTAC) 150 MG tablet; Take 1 tablet (150 mg total) by mouth 2 (two)  times daily as needed for heartburn.  Dispense: 180 tablet; Refill: 1  3. Primary osteoarthritis of right hip  Taking medication prn, doing well with activity modification   4. Chronic pain of right knee  Intermittent symptoms   5. Seasonal allergic rhinitis due to pollen  - loratadine (CLARITIN) 10 MG tablet; Take 1 tablet (10 mg total) by mouth daily.  Dispense: 90 tablet; Refill: 1 - fluticasone (FLONASE) 50 MCG/ACT nasal spray; Place 2 sprays into both nostrils daily.  Dispense: 16 g; Refill: 2

## 2017-11-11 DIAGNOSIS — G4733 Obstructive sleep apnea (adult) (pediatric): Secondary | ICD-10-CM | POA: Diagnosis not present

## 2017-11-28 ENCOUNTER — Encounter: Payer: BLUE CROSS/BLUE SHIELD | Admitting: Family Medicine

## 2018-01-18 DIAGNOSIS — G4733 Obstructive sleep apnea (adult) (pediatric): Secondary | ICD-10-CM | POA: Diagnosis not present

## 2018-02-24 DIAGNOSIS — G4733 Obstructive sleep apnea (adult) (pediatric): Secondary | ICD-10-CM | POA: Diagnosis not present

## 2018-03-12 ENCOUNTER — Emergency Department
Admission: EM | Admit: 2018-03-12 | Discharge: 2018-03-12 | Disposition: A | Discharge status: Home / Self Care | Payer: Self-pay | Attending: Student in an Organized Health Care Education/Training Program | Admitting: Student in an Organized Health Care Education/Training Program

## 2018-03-12 ENCOUNTER — Other Ambulatory Visit: Payer: Self-pay

## 2018-03-12 DIAGNOSIS — Z79899 Other long term (current) drug therapy: Secondary | ICD-10-CM | POA: Insufficient documentation

## 2018-03-12 DIAGNOSIS — S61211A Laceration without foreign body of left index finger without damage to nail, initial encounter: Secondary | ICD-10-CM | POA: Insufficient documentation

## 2018-03-12 DIAGNOSIS — Y9389 Activity, other specified: Secondary | ICD-10-CM | POA: Insufficient documentation

## 2018-03-12 DIAGNOSIS — W268XXA Contact with other sharp object(s), not elsewhere classified, initial encounter: Secondary | ICD-10-CM | POA: Insufficient documentation

## 2018-03-12 DIAGNOSIS — Y998 Other external cause status: Secondary | ICD-10-CM | POA: Insufficient documentation

## 2018-03-12 DIAGNOSIS — Y92019 Unspecified place in single-family (private) house as the place of occurrence of the external cause: Secondary | ICD-10-CM | POA: Insufficient documentation

## 2018-03-12 DIAGNOSIS — Z23 Encounter for immunization: Secondary | ICD-10-CM | POA: Insufficient documentation

## 2018-03-12 MED ORDER — TETANUS-DIPHTH-ACELL PERTUSSIS 5-2.5-18.5 LF-MCG/0.5 IM SUSP
0.5000 mL | Freq: Once | INTRAMUSCULAR | Status: AC
  Administered 2018-03-12: 0.5 mL via INTRAMUSCULAR
  Filled 2018-03-12: qty 0.5

## 2018-03-12 MED ORDER — LIDOCAINE HCL (PF) 1 % IJ SOLN
5.0000 mL | Freq: Once | INTRAMUSCULAR | Status: AC
  Administered 2018-03-12: 5 mL via INTRADERMAL
  Filled 2018-03-12: qty 5

## 2018-03-12 MED ORDER — BACITRACIN ZINC 500 UNIT/GM EX OINT
1.0000 "application " | TOPICAL_OINTMENT | Freq: Once | CUTANEOUS | Status: AC
  Administered 2018-03-12: 1 via TOPICAL
  Filled 2018-03-12: qty 0.9

## 2018-03-12 NOTE — Discharge Instructions (Addendum)
Follow-up with your regular doctor in 7 to 10 days for suture removal.  You may also return to the emergency department but they will charge you for suture removal.  Keep the areas clean and dry as possible.  You may shower and wash her hands with soap and water.  The more dry the area of the easier it will heal.  If it becomes crusty apply some Vaseline to the area.  Return to the emergency department if any signs of infection which include redness, pus, swelling, or increased pain.

## 2018-03-12 NOTE — ED Provider Notes (Signed)
[facility] Emergency Department Provider Note  ____________________________________________   First MD Initiated Contact with Patient 03/12/18 1137     (approximate)  I have reviewed the triage vital signs and the nursing notes.   HISTORY  Chief Complaint Laceration    HPI Daniel Garner is a 62 y.o. male resents emergency department with a laceration to the left index finger.  He cut it on a razor at home.  He is unsure of his last tetanus.  He denies any other injuries.      Past Medical History:  Diagnosis Date  . Arthritis   . 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 Prostatism 01/13/2015  . Sleep apnea     Patient Active Problem List   Diagnosis Date Noted  . Osteoarthritis of right hip 05/27/2017  . Dyslipidemia 08/22/2016  . 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 Sleep apnea 08/16/2016  . Osteoarthritis 08/16/2016  . History of migraine 08/16/2016  . ED (erectile dysfunction) 08/16/2016  . GERD without esophagitis 08/16/2016  . 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 Prostatism 01/13/2015    Past Surgical History:  Procedure Laterality Date  . COLONOSCOPY  2000  . COLONOSCOPY WITH PROPOFOL N/A 08/06/2015   Procedure: COLONOSCOPY WITH PROPOFOL;  Surgeon: Christene Lye, MD;  Location: [facility] ENDOSCOPY;  Service: Endoscopy;  Laterality: N/A;  . HERNIA REPAIR  [phone]  . TONSILLECTOMY      Prior to Admission medications   Medication Sig Start Date End Date Taking? Authorizing Provider  aspirin EC 81 MG tablet Take 1 tablet (81 mg total) by mouth daily. Patient not taking: Reported on 05/27/2017 12/04/15   Steele Sizer, MD  Cholecalciferol (VITAMIN D-3) 1000 units CAPS Take by mouth.    [provider]  cyanocobalamin 1000 MCG tablet Take 1,000 mcg by mouth daily.    [provider]  DOCOSAHEXAENOIC ACID PO Use 1,000 mg once daily.     [provider]  Fish Oil OIL 1 capsule by Does not apply route daily.    [provider]  fluticasone (FLONASE) 50 MCG/ACT nasal spray Place 2 sprays into both nostrils daily. 10/05/17   Steele Sizer, MD  ibuprofen (ADVIL,MOTRIN) 800 MG tablet 800 mg 10/08/07   [provider]  loratadine (CLARITIN) 10 MG tablet Take 1 tablet (10 mg total) by mouth daily. 10/05/17   Steele Sizer, MD  ranitidine (ZANTAC) 150 MG tablet Take 1 tablet (150 mg total) by mouth 2 (two) times daily as needed for heartburn. 10/05/17   Steele Sizer, MD  vitamin C (ASCORBIC ACID) 500 MG tablet Take 500 mg by mouth daily.    [provider]  vitamin E 400 UNIT capsule Take 400 Units by mouth 2 (two) times daily.    [provider]    Allergies Patient has no known allergies.  Family History  Problem Relation Age of Onset  . Hypertension Mother   . Prostate cancer Father     Social History Social History   Tobacco Use  . Smoking status: Never Smoker  . Smokeless tobacco: Never Used  Substance Use Topics  . Alcohol use: No    Alcohol/week: 0.0 standard drinks  . Drug use: No    Review of Systems  Constitutional: No fever/chills Eyes: No visual changes. ENT: No sore throat. Respiratory: Denies cough Genitourinary: Negative for dysuria. Musculoskeletal: Negative for back pain. Skin: Negative for rash.  Positive for laceration to left index  finger    ____________________________________________   PHYSICAL EXAM:  VITAL SIGNS: ED Triage Vitals  Enc Vitals Group     BP 03/12/18 1132 (!) 143/96     Pulse Rate 03/12/18 1132 69     Resp 03/12/18 1132 16     Temp 03/12/18 1132 98.3 F (36.8 C)     Temp Source 03/12/18 1132 Oral     SpO2 03/12/18 1132 99 %     Weight 03/12/18 1131 190 lb (86.2 kg)     Height 03/12/18 1131 5\' 10"  (1.778 m)     Head Circumference --      Peak Flow --      Pain Score 03/12/18 1131 2     Pain Loc --      Pain Edu? --       Excl. in [place]? --     Constitutional: Alert and oriented. Well appearing and in no acute distress. Eyes: Conjunctivae are normal.  Head: Atraumatic. Nose: No congestion/rhinnorhea. Mouth/Throat: Mucous membranes are moist.   Neck:  supple no lymphadenopathy noted Cardiovascular: Normal rate, regular rhythm.  Respiratory: Normal respiratory effort.  No retractions  GU: deferred Musculoskeletal: FROM all extremities, warm and well perfused.  Positive for 1.5 cm laceration to the left index finger. Neurologic:  Normal speech and language.  Skin:  Skin is warm, dry complaints for index finger laceration psychiatric: Mood and affect are normal. Speech and behavior are normal.  ____________________________________________   LABS (all labs ordered are listed, but only abnormal results are displayed)  Labs Reviewed - No data to display ____________________________________________   ____________________________________________  RADIOLOGY    ____________________________________________   PROCEDURES  Procedure(s) performed:  Marland KitchenMarland KitchenLaceration Repair Date/Time: 03/12/2018 12:19 PM Performed by: Versie Starks, PA-C Authorized by: Versie Starks, PA-C   Consent:    Consent obtained:  Verbal   Consent given by:  Patient   Risks discussed:  Infection, pain, poor cosmetic result and poor wound healing Anesthesia (see MAR for exact dosages):    Anesthesia method:  Nerve block   Block needle gauge:  25 G   Block anesthetic:  Lidocaine 1% w/o epi   Block outcome:  Anesthesia achieved Laceration details:    Location:  Finger   Finger location:  L index finger   Length (cm):  2   Depth (mm):  2 Repair type:    Repair type:  Simple Pre-procedure details:    Preparation:  Patient was prepped and draped in usual sterile fashion Exploration:    Hemostasis achieved with:  Direct pressure   Wound exploration: wound explored through full range of motion     Wound extent: no foreign  bodies/material noted, no tendon damage noted and no underlying fracture noted     Contaminated: no   Treatment:    Area cleansed with:  Betadine and saline   Amount of cleaning:  Standard   Irrigation solution:  Sterile saline   Irrigation method:  Syringe and tap   Visualized foreign bodies/material removed: no   Skin repair:    Repair method:  Sutures   Suture size:  5-0   Suture material:  Nylon   Suture technique:  Simple interrupted   Number of sutures:  7 Approximation:    Approximation:  Close Post-procedure details:    Dressing:  Antibiotic ointment and non-adherent dressing   Patient tolerance of procedure:  Tolerated well, no immediate complications      ____________________________________________   INITIAL IMPRESSION / ASSESSMENT AND PLAN /  ED COURSE  Pertinent labs & imaging results that were available during my care of the patient were reviewed by me and considered in my medical decision making (see chart for details).   Patient is 62 year old male presents emergency department complaining of laceration to the left index finger.  He cut it on a razor while at home.  He is unsure of his last tetanus.  School exam patient has a 1.5 Sarahn laceration to the distal area of the left index finger on the palmar side.  No foreign bodies noted.  No tendon involvement.  The area was repaired with 5-0 Ethilon.  7 simple sutures were inserted.  The patient was given instructions on how to care for the wound.  He was given a Tdap while here in the emergency department.  He is to follow-up with his regular doctor or return to the emergency department in 7 to 10 days for suture removal.  Patient states he understands.  He will return here if any signs of infection.  He was discharged in stable condition.     As part of my medical decision making, I reviewed the following data within the [place] notes reviewed and incorporated, Old chart reviewed,  Notes from prior ED visits and [place] Controlled Substance Database  ____________________________________________   FINAL CLINICAL IMPRESSION(S) / ED DIAGNOSES  Final diagnoses:  Laceration of left index finger without foreign body without damage to nail, initial encounter      NEW MEDICATIONS STARTED DURING THIS VISIT:  New Prescriptions   No medications on file     Note:  This document was prepared using Dragon voice recognition software and may include unintentional dictation errors.    Versie Starks, PA-C 03/12/18 1221    Merlyn Lot, MD 03/12/18 1430

## 2018-03-12 NOTE — ED Triage Notes (Signed)
Pt arrives to ED. Cut L pointer finger on razor at home. Bleeding controlled by bandage. Unsure of tetanus shot. Alert, oriented, ambulatory. Finger cleaned with saline in triage and new bandage placed.

## 2018-03-22 ENCOUNTER — Encounter: Payer: Self-pay | Admitting: Nurse Practitioner

## 2018-03-22 ENCOUNTER — Ambulatory Visit (INDEPENDENT_AMBULATORY_CARE_PROVIDER_SITE_OTHER): Payer: BLUE CROSS/BLUE SHIELD | Admitting: Nurse Practitioner

## 2018-03-22 VITALS — BP 106/70 | HR 85 | Temp 97.4°F | Resp 12 | Ht 70.0 in | Wt 193.7 lb

## 2018-03-22 DIAGNOSIS — Z4802 Encounter for removal of sutures: Secondary | ICD-10-CM | POA: Diagnosis not present

## 2018-03-22 NOTE — Patient Instructions (Signed)
-   Healing well, continue to keep clean and dry

## 2018-03-22 NOTE — Progress Notes (Signed)
Name: Daniel Garner   MRN: [id number]    DOB: 01-30-1956   Date:03/22/2018       Progress Note  Subjective  Chief Complaint  Chief Complaint  Patient presents with  . Suture / Staple Removal    HPI Patient cut left index finger when shaving and sutured with 7 stitches at ER on 8/18/219. TDAP updated at ER.     Patient Active Problem List   Diagnosis Date Noted  . Osteoarthritis of right hip 05/27/2017  . Dyslipidemia 08/22/2016  . 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 Sleep apnea 08/16/2016  . Osteoarthritis 08/16/2016  . History of migraine 08/16/2016  . ED (erectile dysfunction) 08/16/2016  . GERD without esophagitis 08/16/2016  . 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 Prostatism 01/13/2015    Past Medical History:  Diagnosis Date  . Arthritis   . 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 Prostatism 01/13/2015  . Sleep apnea     Past Surgical History:  Procedure Laterality Date  . COLONOSCOPY  2000  . COLONOSCOPY WITH PROPOFOL N/A 08/06/2015   Procedure: COLONOSCOPY WITH PROPOFOL;  Surgeon: Christene Lye, MD;  Location: [facility] ENDOSCOPY;  Service: Endoscopy;  Laterality: N/A;  . HERNIA REPAIR  [phone]  . TONSILLECTOMY      Social History   Tobacco Use  . Smoking status: Never Smoker  . Smokeless tobacco: Never Used  Substance Use Topics  . Alcohol use: No    Alcohol/week: 0.0 standard drinks     Current Outpatient Medications:  .  aspirin EC 81 MG tablet, Take 1 tablet (81 mg total) by mouth daily., Disp: 30 tablet, Rfl: 0 .  Cholecalciferol (VITAMIN D-3) 1000 units CAPS, Take by mouth., Disp: , Rfl:  .  cyanocobalamin 1000 MCG tablet, Take 1,000 mcg by mouth daily., Disp: , Rfl:  .  DOCOSAHEXAENOIC ACID PO, Use 1,000 mg once daily. , Disp: , Rfl:  .  Fish Oil OIL, 1 capsule by Does not apply route daily., Disp: , Rfl:  .  fluticasone (FLONASE) 50 MCG/ACT nasal spray, Place 2 sprays into both  nostrils daily., Disp: 16 g, Rfl: 2 .  ibuprofen (ADVIL,MOTRIN) 800 MG tablet, 800 mg, Disp: , Rfl:  .  loratadine (CLARITIN) 10 MG tablet, Take 1 tablet (10 mg total) by mouth daily., Disp: 90 tablet, Rfl: 1 .  ranitidine (ZANTAC) 150 MG tablet, Take 1 tablet (150 mg total) by mouth 2 (two) times daily as needed for heartburn., Disp: 180 tablet, Rfl: 1 .  vitamin C (ASCORBIC ACID) 500 MG tablet, Take 500 mg by mouth daily., Disp: , Rfl:  .  vitamin E 400 UNIT capsule, Take 400 Units by mouth 2 (two) times daily., Disp: , Rfl:   No Known Allergies  Review of Systems  Constitutional: Negative for chills and fever.  Skin: Negative for itching and rash.     No other specific complaints in a complete review of systems (except as listed in HPI above).  Objective  Vitals:   03/22/18 0930  BP: 106/70  Pulse: 85  Resp: 12  Temp: (!) 97.4 F (36.3 C)  TempSrc: Oral  SpO2: 99%  Weight: 193 lb 11.2 oz (87.9 kg)  Height: 5\' 10"  (1.778 m)     Body mass index is 27.79 kg/m.  Nursing Note and Vital Signs reviewed.  Physical Exam  Well healed 2nd digit left hand laceration well healed, no heat, redness or drainage.    No results  found for this or any previous visit (from the past 48 hour(s)).  Assessment & Plan  1. Visit for suture removal 7 stitches removed without issue.   -Follow up and care instructions discussed and provided in AVS.

## 2018-05-02 DIAGNOSIS — G4733 Obstructive sleep apnea (adult) (pediatric): Secondary | ICD-10-CM | POA: Diagnosis not present

## 2018-07-26 DIAGNOSIS — U071 COVID-19: Secondary | ICD-10-CM

## 2018-07-26 HISTORY — DX: COVID-19: U07.1

## 2018-08-16 ENCOUNTER — Encounter: Payer: Self-pay | Admitting: Nurse Practitioner

## 2018-08-16 ENCOUNTER — Ambulatory Visit: Payer: BLUE CROSS/BLUE SHIELD | Admitting: Nurse Practitioner

## 2018-08-16 VITALS — BP 138/80 | HR 100 | Temp 98.2°F | Resp 16 | Ht 70.0 in | Wt 199.1 lb

## 2018-08-16 DIAGNOSIS — R5383 Other fatigue: Secondary | ICD-10-CM

## 2018-08-16 DIAGNOSIS — Z13220 Encounter for screening for lipoid disorders: Secondary | ICD-10-CM | POA: Diagnosis not present

## 2018-08-16 DIAGNOSIS — E559 Vitamin D deficiency, unspecified: Secondary | ICD-10-CM

## 2018-08-16 DIAGNOSIS — G4733 Obstructive sleep apnea (adult) (pediatric): Secondary | ICD-10-CM | POA: Diagnosis not present

## 2018-08-16 DIAGNOSIS — Z114 Encounter for screening for human immunodeficiency virus [HIV]: Secondary | ICD-10-CM

## 2018-08-16 DIAGNOSIS — R51 Headache: Secondary | ICD-10-CM

## 2018-08-16 DIAGNOSIS — R7301 Impaired fasting glucose: Secondary | ICD-10-CM | POA: Diagnosis not present

## 2018-08-16 DIAGNOSIS — R110 Nausea: Secondary | ICD-10-CM

## 2018-08-16 DIAGNOSIS — H538 Other visual disturbances: Secondary | ICD-10-CM

## 2018-08-16 DIAGNOSIS — R519 Headache, unspecified: Secondary | ICD-10-CM

## 2018-08-16 DIAGNOSIS — G44319 Acute post-traumatic headache, not intractable: Secondary | ICD-10-CM

## 2018-08-16 NOTE — Progress Notes (Signed)
Name: Daniel Garner   MRN: [id number]    DOB: May 23, 1956   Date:08/16/2018       Progress Note  Subjective  Chief Complaint  Chief Complaint  Patient presents with  . Hypertension    BP was elevated during Christmas  . Headache    patient questions if he has a concussion. a box of pretzels fell on his head while at work.  . Fatigue    patient has been working a lot of hours but is not sure if it is related to his sx    HPI  Patient states the week before christmas at work had a box of pretzels fall on his head (approximately10 pounds). No LOC. Has also noticed he has started to get frequent headaches the last month. He thinks this started after the incident but not entirely sure. States resumed to normal activity without further issue. States headaches are very mild rates it a 1/10 most times but occasionally gets a little worse. Endorses some mild nausea a few times when headaches get a little worse. States sometimes gets foggy vision thinks its related to headaches but hasn't been paying attention to when it is happening. Some triggers for headaches he seems to have noticed are watching TV, driving for awhile. Has not tried any medications for this. States episodes last few minutes to several hours. He also has noted some fatigue over the past few weeks as well, just seems more tired than normal at the end of his work days. Gets about 6 hours of sleep a night and feels well rested in the morning. Denies confusion; lives at home with your wife- hasn't noticed changes in behavior. States headaches have not gotten worse and have not increased in frequency but they are still happening.   Denies dizziness, lightheadedness, hearing loss, palpitations, speech changes, weakness, chest pain, weight loss, fevers, chills, tinnitus, photophobia, phonophobia.  Has noticed increase in blood pressures- checked it on christmas states doesn't remember but it was high; does not eat much salt.   BP Readings  from Last 3 Encounters:  08/16/18 138/80  03/22/18 106/70  03/12/18 116/86     Patient Active Problem List   Diagnosis Date Noted  . Osteoarthritis of right hip 05/27/2017  . Dyslipidemia 08/22/2016  . 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 Sleep apnea 08/16/2016  . Osteoarthritis 08/16/2016  . History of migraine 08/16/2016  . ED (erectile dysfunction) 08/16/2016  . GERD without esophagitis 08/16/2016  . 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 Prostatism 01/13/2015    Past Medical History:  Diagnosis Date  . Arthritis   . 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 Prostatism 01/13/2015  . Sleep apnea     Past Surgical History:  Procedure Laterality Date  . COLONOSCOPY  2000  . COLONOSCOPY WITH PROPOFOL N/A 08/06/2015   Procedure: COLONOSCOPY WITH PROPOFOL;  Surgeon: Christene Lye, MD;  Location: [facility] ENDOSCOPY;  Service: Endoscopy;  Laterality: N/A;  . HERNIA REPAIR  [phone]  . TONSILLECTOMY      Social History   Tobacco Use  . Smoking status: Never Smoker  . Smokeless tobacco: Never Used  Substance Use Topics  . Alcohol use: No    Alcohol/week: 0.0 standard drinks     Current Outpatient Medications:  .  aspirin EC 81 MG tablet, Take 1 tablet (81 mg total) by mouth daily., Disp: 30 tablet, Rfl: 0 .  Cholecalciferol (VITAMIN D-3) 1000 units CAPS, Take by mouth.,  Disp: , Rfl:  .  cyanocobalamin 1000 MCG tablet, Take 1,000 mcg by mouth daily., Disp: , Rfl:  .  DOCOSAHEXAENOIC ACID PO, Use 1,000 mg once daily. , Disp: , Rfl:  .  vitamin C (ASCORBIC ACID) 500 MG tablet, Take 500 mg by mouth daily., Disp: , Rfl:  .  vitamin E 400 UNIT capsule, Take 400 Units by mouth 2 (two) times daily., Disp: , Rfl:  .  fluticasone (FLONASE) 50 MCG/ACT nasal spray, Place 2 sprays into both nostrils daily. (Patient not taking: Reported on 08/16/2018), Disp: 16 g, Rfl: 2 .  ibuprofen (ADVIL,MOTRIN) 800 MG tablet, 800 mg, Disp:  , Rfl:   No Known Allergies  ROS  No other specific complaints in a complete review of systems (except as listed in HPI above).  Objective  Vitals:   08/16/18 1333  BP: 138/80  Pulse: 100  Resp: 16  Temp: 98.2 F (36.8 C)  TempSrc: Oral  SpO2: 97%  Weight: 199 lb 1.6 oz (90.3 kg)  Height: 5\' 10"  (1.778 m)    Body mass index is 28.57 kg/m.  Nursing Note and Vital Signs reviewed.  Physical Exam Constitutional:      Appearance: He is well-developed.  HENT:     Head: Normocephalic and atraumatic.     Mouth/Throat:     Mouth: Mucous membranes are moist.     Pharynx: Oropharynx is clear.  Eyes:     General: No visual field deficit.    Extraocular Movements: Extraocular movements intact.     Pupils: Pupils are equal, round, and reactive to light.  Neck:     Musculoskeletal: Neck supple. No neck rigidity.  Cardiovascular:     Rate and Rhythm: Regular rhythm. Tachycardia present.  Pulmonary:     Effort: Pulmonary effort is normal.  Abdominal:     General: Bowel sounds are normal.     Palpations: Abdomen is soft.     Tenderness: There is no abdominal tenderness.  Musculoskeletal: Normal range of motion.  Skin:    General: Skin is dry.     Findings: No rash.  Neurological:     Mental Status: He is alert and oriented to person, place, and time.     GCS: GCS eye subscore is 4. GCS verbal subscore is 5. GCS motor subscore is 6.     Cranial Nerves: No cranial nerve deficit, dysarthria or facial asymmetry.     Sensory: No sensory deficit.     Motor: No weakness.     Coordination: Coordination normal.     Gait: Gait normal.  Psychiatric:        Mood and Affect: Mood normal.        Speech: Speech normal.      No results found for this or any previous visit (from the past 48 hour(s)).  Assessment & Plan  1. Fatigue, unspecified type Discussed hydration, rest, proper nutrition  - COMPLETE METABOLIC PANEL WITH GFR - TSH - CBC w/Diff/Platelet - Vitamin D (25  hydroxy)  2. New onset of headaches Patient endorses new onset of headache at 63 y.o. states there has been a few says where he has not had a headache in the last month. He is a poor historian- unsure if there is some memory impairment due to headache or this was previous. He presents to the clinic by himself but notes that his wife hasn't noticed any changes and was surprised when he said he was planning on going to the doctor as she  feels he has been in his normal health.  He states he does not feel confused at all but just does not know how to describe his symptoms.  Additionally, notes some postconcussive symptoms with blurry vision and some nausea and headaches with eye straining.  No known family history of cancer.  Patient has not been to clinic for routine visit in a very long time have ordered blood work for screening physical to be scheduled in 1 week. - COMPLETE METABOLIC PANEL WITH GFR - CBC w/Diff/Platelet - MR Brain W Wo Contrast; Future  3. Impaired fasting blood sugar - HgB A1c  4. Screening for cholesterol level - Lipid Profile  5. Screening for HIV (human immunodeficiency virus) - HIV antibody (with reflex)  6. Vitamin D deficiency - Vitamin D (25 hydroxy)  7. Acute post-traumatic headache, not intractable - MR Brain W Wo Contrast; Future  8. Blurry vision - MR Brain W Wo Contrast; Future  9. Nausea - MR Brain W Wo Contrast; Future

## 2018-08-16 NOTE — Patient Instructions (Addendum)
- Work on drinking at least 8 glasses of water day again; limit caffeine to one cup a day  Post-Concussion Syndrome A concussion is a brain injury from a direct hit (blow) to your head or body. This blow causes your brain to shake quickly back and forth inside your skull. This can damage brain cells and cause chemical changes in your brain. Concussions are usually not life-threatening but can cause several serious symptoms. Post-concussion syndrome is when symptoms that occur after a concussion last longer than normal. These symptoms can last from weeks to months. What are the causes? The cause of this condition is not known. It can happen whether your head injury was mild or severe. What increases the risk? You are more likely to develop this condition if:  You are male.  You are a child, teen, or young adult.  You had a past head injury.  You have a history of headaches.  You have depression or anxiety. What are the signs or symptoms? Physical symptoms  Headaches.  Tiredness.  Dizziness.  Weakness.  Blurry vision.  Sensitivity to light.  Hearing difficulties. Mental and emotional symptoms  Memory difficulties.  Difficulty with concentration.  Difficulty sleeping or staying asleep.  Feeling irritable.  Anxiety or depression.  Difficulty learning new things. How is this diagnosed? This condition may be diagnosed based on:  Your symptoms.  A description of your injury.  Your medical history. Your health care provider may order other tests such as:  Brain function tests (neurological testing).  CT scan. How is this treated? Treatment for this condition may depend on your symptoms. Symptoms usually go away on their own over time. Treatments may include:  Medicines for headaches.  Resting your brain and body for a few days after your injury.  Rehabilitation therapy, such as: ? Physical or occupational therapy. This may include exercises to help with  balance and dizziness. ? Mental health counseling. ? Speech therapy. ? Vision therapy. A brain and eye specialist can recommend treatments for vision problems. Follow these instructions at home: Medicines  Take over-the-counter and prescription medicines only as told by your health care provider.  Avoid opioid prescription pain medicines when recovering from a concussion. Activity  Limit your mental activities for the first few days after your injury, such as: ? Homework or job-related work. ? Complex thinking. ? Watching TV, and using a computer or phone. ? Playing memory games and puzzles. ? Gradually return to your normal activity level. If a certain activity brings on your symptoms, stop or slow down until you can do the activity without it triggering your symptoms.  Limit physical activity, such as exercise or sports, for the first few days after a concussion. Gradually return to normal activity as told by your health care provider. ? If a certain activity brings on your symptoms, stop or slow down until you can do the activity without it triggering your symptoms.  Rest. Rest helps your brain heal. Make sure you: ? Get plenty of sleep at night. Most adults should get at least 7-9 hours of sleep each night. ? Rest during the day. Take naps or rest breaks when you feel tired.  Do not do high-risk activities that could cause a second concussion, such as riding a bike or playing sports. Having another concussion before the first one has healed can be dangerous. General instructions  Do not drink alcohol until your health care provider says you can.  Keep track of the frequency and the  severity of your symptoms. Give this information to your health care provider.  Keep all follow-up visits as directed by your health care provider. This is important. Contact a health care provider if:  Your symptoms do not improve.  You have another injury. Get help right away if you:  Have a  severe or worsening headache.  Are confused.  Have trouble staying awake.  Pass out.  Vomit.  Have weakness or numbness in any part of your body.  Have a seizure.  Have trouble speaking. Summary  Post-concussion syndrome is when symptoms that occur after a concussion last longer than normal.  Symptoms usually go away on their own over time. Depending on your symptoms, you may need treatment, such as medicines or rehabilitation therapy.  Rest your brain and body for a few days after your injury. Gradually return to activities, as told by your health care provider.  Get plenty of sleep, and avoid alcohol and opioid pain medicines while recovering from a concussion. This information is not intended to replace advice given to you by your health care provider. Make sure you discuss any questions you have with your health care provider. Document Released: 01/01/2002 Document Revised: 08/16/2017 Document Reviewed: 08/16/2017 Elsevier Interactive Patient Education  2019 [employer].

## 2018-08-17 LAB — CBC WITH DIFFERENTIAL/PLATELET
Absolute Monocytes: 416 cells/uL (ref 200–950)
BASOS ABS: 19 {cells}/uL (ref 0–200)
BASOS PCT: 0.3 %
Eosinophils Absolute: 19 cells/uL (ref 15–500)
Eosinophils Relative: 0.3 %
HCT: 42 % (ref 38.5–50.0)
Hemoglobin: 14.1 g/dL (ref 13.2–17.1)
LYMPHS ABS: 2003 {cells}/uL (ref 850–3900)
MCH: 31.1 pg (ref 27.0–33.0)
MCHC: 33.6 g/dL (ref 32.0–36.0)
MCV: 92.7 fL (ref 80.0–100.0)
MPV: 9.8 fL (ref 7.5–12.5)
Monocytes Relative: 6.6 %
NEUTROS ABS: 3843 {cells}/uL (ref 1500–7800)
Neutrophils Relative %: 61 %
PLATELETS: 286 10*3/uL (ref 140–400)
RBC: 4.53 10*6/uL (ref 4.20–5.80)
RDW: 12.9 % (ref 11.0–15.0)
TOTAL LYMPHOCYTE: 31.8 %
WBC: 6.3 10*3/uL (ref 3.8–10.8)

## 2018-08-17 LAB — LIPID PANEL
Cholesterol: 204 mg/dL — ABNORMAL HIGH (ref <200)
HDL: 60 mg/dL (ref >40)
LDL Cholesterol (Calc): 128 mg/dL (calc) — ABNORMAL HIGH
NON-HDL CHOLESTEROL (CALC): 144 mg/dL — AB (ref <130)
Total CHOL/HDL Ratio: 3.4 (calc) (ref <5.0)
Triglycerides: 72 mg/dL (ref <150)

## 2018-08-17 LAB — COMPLETE METABOLIC PANEL WITH GFR
AG Ratio: 1.8 (calc) (ref 1.0–2.5)
ALKALINE PHOSPHATASE (APISO): 75 U/L (ref 40–115)
ALT: 22 U/L (ref 9–46)
AST: 21 U/L (ref 10–35)
Albumin: 4.4 g/dL (ref 3.6–5.1)
BILIRUBIN TOTAL: 0.5 mg/dL (ref 0.2–1.2)
BUN: 15 mg/dL (ref 7–25)
CO2: 28 mmol/L (ref 20–32)
CREATININE: 1.07 mg/dL (ref 0.70–1.25)
Calcium: 9.4 mg/dL (ref 8.6–10.3)
Chloride: 104 mmol/L (ref 98–110)
GFR, Est African American: 87 mL/min/{1.73_m2} (ref >=60)
GFR, Est Non African American: 75 mL/min/{1.73_m2} (ref >=60)
GLOBULIN: 2.4 g/dL (ref 1.9–3.7)
Glucose, Bld: 81 mg/dL (ref 65–99)
Potassium: 4 mmol/L (ref 3.5–5.3)
SODIUM: 139 mmol/L (ref 135–146)
Total Protein: 6.8 g/dL (ref 6.1–8.1)

## 2018-08-17 LAB — HEMOGLOBIN A1C
HEMOGLOBIN A1C: 5.5 %{Hb} (ref <5.7)
Mean Plasma Glucose: 111 (calc)
eAG (mmol/L): 6.2 (calc)

## 2018-08-17 LAB — TSH: TSH: 0.56 m[IU]/L (ref 0.40–4.50)

## 2018-08-17 LAB — VITAMIN D 25 HYDROXY (VIT D DEFICIENCY, FRACTURES): Vit D, 25-Hydroxy: 34 ng/mL (ref 30–100)

## 2018-08-17 LAB — HIV ANTIBODY (ROUTINE TESTING W REFLEX): HIV 1&2 Ab, 4th Generation: NONREACTIVE

## 2018-08-22 ENCOUNTER — Other Ambulatory Visit: Payer: Self-pay | Admitting: Nurse Practitioner

## 2018-08-22 DIAGNOSIS — E7841 Elevated Lipoprotein(a): Secondary | ICD-10-CM

## 2018-08-22 MED ORDER — ATORVASTATIN CALCIUM 20 MG PO TABS: 20.0000 mg | ORAL_TABLET | Freq: Every day | ORAL | 0 refills | Status: DC

## 2018-08-25 ENCOUNTER — Other Ambulatory Visit: Payer: Self-pay | Admitting: Nurse Practitioner

## 2018-08-30 ENCOUNTER — Encounter: Payer: BLUE CROSS/BLUE SHIELD | Admitting: Nurse Practitioner

## 2018-09-13 ENCOUNTER — Ambulatory Visit: Payer: Self-pay

## 2018-10-16 ENCOUNTER — Emergency Department
Admission: EM | Admit: 2018-10-16 | Discharge: 2018-10-16 | Disposition: A | Discharge status: Home / Self Care | Payer: BLUE CROSS/BLUE SHIELD | Attending: Emergency Medicine | Admitting: Emergency Medicine

## 2018-10-16 ENCOUNTER — Ambulatory Visit: Payer: Self-pay | Admitting: *Deleted

## 2018-10-16 ENCOUNTER — Other Ambulatory Visit: Payer: Self-pay

## 2018-10-16 ENCOUNTER — Encounter: Payer: Self-pay | Admitting: Emergency Medicine

## 2018-10-16 ENCOUNTER — Emergency Department: Payer: BLUE CROSS/BLUE SHIELD

## 2018-10-16 DIAGNOSIS — Z79899 Other long term (current) drug therapy: Secondary | ICD-10-CM | POA: Insufficient documentation

## 2018-10-16 DIAGNOSIS — R079 Chest pain, unspecified: Secondary | ICD-10-CM | POA: Insufficient documentation

## 2018-10-16 DIAGNOSIS — J069 Acute upper respiratory infection, unspecified: Secondary | ICD-10-CM | POA: Diagnosis not present

## 2018-10-16 DIAGNOSIS — Z7982 Long term (current) use of aspirin: Secondary | ICD-10-CM | POA: Insufficient documentation

## 2018-10-16 DIAGNOSIS — R0789 Other chest pain: Secondary | ICD-10-CM | POA: Diagnosis not present

## 2018-10-16 LAB — CBC
HCT: 41.5 % (ref 39.0–52.0)
HEMOGLOBIN: 14.2 g/dL (ref 13.0–17.0)
MCH: 31.7 pg (ref 26.0–34.0)
MCHC: 34.2 g/dL (ref 30.0–36.0)
MCV: 92.6 fL (ref 80.0–100.0)
NRBC: 0 % (ref 0.0–0.2)
Platelets: 277 10*3/uL (ref 150–400)
RBC: 4.48 MIL/uL (ref 4.22–5.81)
RDW: 13.2 % (ref 11.5–15.5)
WBC: 5.9 10*3/uL (ref 4.0–10.5)

## 2018-10-16 LAB — INFLUENZA PANEL BY PCR (TYPE A & B)
INFLAPCR: NEGATIVE
INFLBPCR: NEGATIVE

## 2018-10-16 LAB — BASIC METABOLIC PANEL
ANION GAP: 8 (ref 5–15)
BUN: 11 mg/dL (ref 6–20)
CO2: 26 mmol/L (ref 22–32)
Calcium: 9.2 mg/dL (ref 8.9–10.3)
Chloride: 102 mmol/L (ref 98–111)
Creatinine, Ser: 0.99 mg/dL (ref 0.61–1.24)
GFR calc Af Amer: >60 mL/min (ref >60)
Glucose, Bld: 105 mg/dL — ABNORMAL HIGH (ref 70–99)
POTASSIUM: 3.9 mmol/L (ref 3.5–5.1)
Sodium: 136 mmol/L (ref 135–145)

## 2018-10-16 LAB — TROPONIN I

## 2018-10-16 MED ORDER — ASPIRIN 81 MG PO CHEW
324.0000 mg | CHEWABLE_TABLET | Freq: Once | ORAL | Status: AC
  Administered 2018-10-16: 324 mg via ORAL
  Filled 2018-10-16: qty 4

## 2018-10-16 NOTE — ED Provider Notes (Signed)
[facility] Emergency Department Provider Note   ____________________________________________   First MD Initiated Contact with Patient 10/16/18 1235     (approximate)  I have reviewed the triage vital signs and the nursing notes.   HISTORY  Chief Complaint Chest Pain; Generalized Body Aches; and Cough    HPI Daniel Garner is a 63 y.o. male here for evaluation of cough and chest pain  The patient reports that a week and a half ago he been experiencing a dry cough.  He had some achiness in his muscles and joints with it.  He is not any travel history exposure to anyone with known coronavirus  Reports also over the last several days he experience a feeling of discomfort or  pain across the middle of his chest.  He has no history of heart disease.  Called his doctor who recommended he come to be evaluated make sure he is not having a heart issue.  He reports overall his fevers and cough seem to be slowly improving.  Yesterday he even walked on the treadmill for 45 minutes and did not have any issues with chest discomfort.  He has no personal history of heart disease  Has a family history his father had coronary disease but not till later in life and to his 78s when he had a bypass  Past Medical History:  Diagnosis Date  . Arthritis   . 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 Prostatism 01/13/2015  . Sleep apnea     Patient Active Problem List   Diagnosis Date Noted  . Osteoarthritis of right hip 05/27/2017  . Dyslipidemia 08/22/2016  . 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 Sleep apnea 08/16/2016  . Osteoarthritis 08/16/2016  . History of migraine 08/16/2016  . ED (erectile dysfunction) 08/16/2016  . GERD without esophagitis 08/16/2016  . 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 Prostatism 01/13/2015    Past Surgical History:  Procedure Laterality Date  . COLONOSCOPY  2000  . COLONOSCOPY  WITH PROPOFOL N/A 08/06/2015   Procedure: COLONOSCOPY WITH PROPOFOL;  Surgeon: Christene Lye, MD;  Location: [facility] ENDOSCOPY;  Service: Endoscopy;  Laterality: N/A;  . HERNIA REPAIR  [id number]  . TONSILLECTOMY      Prior to Admission medications   Medication Sig Start Date End Date Taking? Authorizing Provider  aspirin EC 81 MG tablet Take 1 tablet (81 mg total) by mouth daily. 12/04/15   Steele Sizer, MD  atorvastatin (LIPITOR) 20 MG tablet Take 1 tablet (20 mg total) by mouth daily. 08/22/18   Poulose, Bethel Born, NP  Cholecalciferol (VITAMIN D-3) 1000 units CAPS Take by mouth.    [provider]  cyanocobalamin 1000 MCG tablet Take 1,000 mcg by mouth daily.    [provider]  DOCOSAHEXAENOIC ACID PO Use 1,000 mg once daily.     [provider]  fluticasone (FLONASE) 50 MCG/ACT nasal spray Place 2 sprays into both nostrils daily. Patient not taking: Reported on 08/16/2018 10/05/17   Steele Sizer, MD  ibuprofen (ADVIL,MOTRIN) 800 MG tablet 800 mg 10/08/07   [provider]  vitamin C (ASCORBIC ACID) 500 MG tablet Take 500 mg by mouth daily.    [provider]  vitamin E 400 UNIT capsule Take 400 Units by mouth 2 (two) times daily.    [provider]    Allergies Patient has no known allergies.  Family History  Problem Relation Age of Onset  . Arthritis Mother   .  Colon cancer Father   . Prostate cancer Father     Social History Social History   Tobacco Use  . Smoking status: Never Smoker  . Smokeless tobacco: Never Used  Substance Use Topics  . Alcohol use: No    Alcohol/week: 0.0 standard drinks  . Drug use: No    Review of Systems Constitutional: No fever/chills Eyes: No visual changes. ENT: No sore throat. Cardiovascular: Chest pain is been present for several days, fairly well localized in the area of the breastbone.  Seems worse when he lays down at night and does not radiate Respiratory: Denies  shortness of breath. Gastrointestinal: No abdominal pain.   Genitourinary: Negative for dysuria. Musculoskeletal: Negative for back pain. Skin: Negative for rash. Neurological: Negative for headaches, areas of focal weakness or numbness.    ____________________________________________   PHYSICAL EXAM:  VITAL SIGNS: ED Triage Vitals  Enc Vitals Group     BP --      Pulse --      Resp --      Temp --      Temp src --      SpO2 --      Weight 10/16/18 1121 190 lb (86.2 kg)     Height 10/16/18 1121 5\' 10"  (1.778 m)     Head Circumference --      Peak Flow --      Pain Score 10/16/18 1120 1     Pain Loc --      Pain Edu? --      Excl. in [place]? --     Constitutional: Alert and oriented. Well appearing and in no acute distress. Eyes: Conjunctivae are normal. Head: Atraumatic. Nose: No congestion/rhinnorhea. Mouth/Throat: Mucous membranes are moist. Neck: No stridor.  Cardiovascular: Normal rate, regular rhythm. Grossly normal heart sounds.  Good peripheral circulation. Respiratory: Normal respiratory effort.  No retractions. Lungs CTAB.  Speaks in full clear sentences.  Occasional dry cough. Gastrointestinal: Soft and nontender. No distention. Musculoskeletal: No lower extremity tenderness nor edema. Neurologic:  Normal speech and language. No gross focal neurologic deficits are appreciated.  Skin:  Skin is warm, dry and intact. No rash noted. Psychiatric: Mood and affect are normal. Speech and behavior are normal.  ____________________________________________   LABS (all labs ordered are listed, but only abnormal results are displayed)  Labs Reviewed  BASIC METABOLIC PANEL - Abnormal; Notable for the following components:      Result Value   Glucose, Bld 105 (*)    All other components within normal limits  CBC  TROPONIN I  TROPONIN I  INFLUENZA PANEL BY PCR (TYPE A & B)   ____________________________________________  EKG  ED ECG REPORT I, Delman Kitten, the  attending physician, personally viewed and interpreted this ECG.  Date: 10/16/2018 EKG Time: 1130 Rate: 70 Rhythm: normal sinus rhythm QRS Axis: normal Intervals: normal ST/T Wave abnormalities: normal Narrative Interpretation: no evidence of acute ischemia  ____________________________________________  RADIOLOGY  Clear chest x-ray ____________________________________________   PROCEDURES  Procedure(s) performed: None  Procedures  Critical Care performed: No  ____________________________________________   INITIAL IMPRESSION / ASSESSMENT AND PLAN / ED COURSE  Pertinent labs & imaging results that were available during my care of the patient were reviewed by me and considered in my medical decision making (see chart for details).   Differential diagnosis includes, but is not limited to, ACS, aortic dissection, pulmonary embolism, cardiac tamponade, pneumothorax, pneumonia, pericarditis, myocarditis, GI-related causes including esophagitis/gastritis, and musculoskeletal chest wall pain.  I  most suspect given his history of cough congestion and other symptoms surrounding this that this is likely some sort of pleurisy.  His EKG is normal troponin normal he has had atypical symptoms except for up feeling of discomfort midsternal.  He has no personal history of heart disease.  Plan to obtain a chest x-ray, also influenza test.  His lab work including his first troponin is quite reassuring at this point.  The patient is deemed low risk to have coronavirus at this time.   Clinical Course as of Oct 16 1554  Mon Oct 16, 2018  1457 Patient continues to do well.  Pain and symptom-free.  Ambulatory in no distress.   [MQ]    Clinical Course User Index [MQ] Delman Kitten, MD   Second troponin normal.  Patient resting comfortably.  Does not appear consistent with acute cardiac etiology or ACS.  Appears consistent with upper respiratory infection and likely some development of pleurisy.   Follow-up with primary care doctor advised, as clinics are currently closed in multiple areas I discussed very careful return precautions with him which he is in agreement with  We will also advise him to take precautions as though this could though it is low risk for coronavirus so as not to spread the disease and also said the patient is aware of follow-up recommendations.  At the present time we are not testing patient's deemed to be relatively low risk due to supply of test gets  Return precautions and treatment recommendations and follow-up discussed with the patient who is agreeable with the plan.  HEART score = 2  ____________________________________________   FINAL CLINICAL IMPRESSION(S) / ED DIAGNOSES  Final diagnoses:  Chest pain with low risk for cardiac etiology  Upper respiratory tract infection, unspecified type        Note:  This document was prepared using Dragon voice recognition software and may include unintentional dictation errors       Delman Kitten, MD 10/16/18 1556

## 2018-10-16 NOTE — Telephone Encounter (Signed)
Patient called on COVID-19 line - he thinks he may be having symptom of virus- chest pain. Triaged patient for chest pain as he has not had known exposure - no fever, SOB- cough last few weeks- but not now. Call to office- due to decrease in his symptoms- and they are in agreement- concern over cardiac function directs patient to ED- he does agree to go.  Reason for Disposition . [1] Chest pain lasts > 5 minutes AND [2] age > 71    Patient had severe chest pain last night- he states he still feel discomfort rated at 1- but not like last night- call to office- they agree- needs ED visit.  Answer Assessment - Initial Assessment Questions 1. LOCATION: "Where does it hurt?"       Middle of chest 2. RADIATION: "Does the pain go anywhere else?" (e.g., into neck, jaw, arms, back)     In back and legs- aching 3. ONSET: "When did the chest pain begin?" (Minutes, hours or days)      Started last night- pressure a few days ago- Friday. Yesterday more intense- almost went to hospital 4. PATTERN "Does the pain come and go, or has it been constant since it started?"  "Does it get worse with exertion?"      Pain was worse last night-  Discomfort now 5. DURATION: "How long does it last" (e.g., seconds, minutes, hours)     Constant- hours 6. SEVERITY: "How bad is the pain?"  (e.g., Scale 1-10; mild, moderate, or severe)    - MILD (1-3): doesn't interfere with normal activities     - MODERATE (4-7): interferes with normal activities or awakens from sleep    - SEVERE (8-10): excruciating pain, unable to do any normal activities       Little to none- 1- last night it was worse- he had pressure 7. CARDIAC RISK FACTORS: "Do you have any history of heart problems or risk factors for heart disease?" (e.g., prior heart attack, angina; high blood pressure, diabetes, being overweight, high cholesterol, smoking, or strong family history of heart disease)     Strong family history, BP up at last visit 8. PULMONARY RISK  FACTORS: "Do you have any history of lung disease?"  (e.g., blood clots in lung, asthma, emphysema, birth control pills)     no 9. CAUSE: "What do you think is causing the chest pain?"     Patient had had cough for weeks- he thought his symptoms were related to COVID 10. OTHER SYMPTOMS: "Do you have any other symptoms?" (e.g., dizziness, nausea, vomiting, sweating, fever, difficulty breathing, cough)       Nausea yesterday, dizzy after treadmill 11. PREGNANCY: "Is there any chance you are pregnant?" "When was your last menstrual period?"       n/a  Protocols used: CHEST PAIN-A-AH

## 2018-10-16 NOTE — ED Notes (Signed)
Purple and green tubes sent to lab.

## 2018-10-16 NOTE — ED Notes (Signed)
Pt signed paper copy of d/c paperwork.  

## 2018-10-16 NOTE — Discharge Instructions (Signed)
You have been seen in the Emergency Department (ED) today for chest pain.  As we have discussed todays test results are normal, but you may require further testing.  Please follow up with the recommended doctor as instructed above in these documents regarding todays emergent visit and your recent symptoms to discuss further management.    Return to the Emergency Department (ED) if you experience any further chest pain/pressure/tightness, difficulty breathing, or sudden sweating, or other symptoms that concern you.      Person Under Monitoring Name: Daniel Garner  Location: [address] [place] [address]   Infection Prevention Recommendations for Individuals Confirmed to have, or Being Evaluated for, 2019 Novel Coronavirus (COVID-19) Infection Who Receive Care at Home  Individuals who are confirmed to have, or are being evaluated for, COVID-19 should follow the prevention steps below until a healthcare provider or local or state health department says they can return to normal activities.  Stay home except to get medical care You should restrict activities outside your home, except for getting medical care. Do not go to work, school, or public areas, and do not use public transportation or taxis.  Call ahead before visiting your doctor Before your medical appointment, call the healthcare provider and tell them that you have, or are being evaluated for, COVID-19 infection. This will help the healthcare providers office take steps to keep other people from getting infected. Ask your healthcare provider to call the local or state health department.  Monitor your symptoms Seek prompt medical attention if your illness is worsening (e.g., difficulty breathing). Before going to your medical appointment, call the healthcare provider and tell them that you have, or are being evaluated for, COVID-19 infection. Ask your healthcare provider to call the local or state health  department.  Wear a facemask You should wear a facemask that covers your nose and mouth when you are in the same room with other people and when you visit a healthcare provider. People who live with or visit you should also wear a facemask while they are in the same room with you.  Separate yourself from other people in your home As much as possible, you should stay in a different room from other people in your home. Also, you should use a separate bathroom, if available.  Avoid sharing household items You should not share dishes, drinking glasses, cups, eating utensils, towels, bedding, or other items with other people in your home. After using these items, you should wash them thoroughly with soap and water.  Cover your coughs and sneezes Cover your mouth and nose with a tissue when you cough or sneeze, or you can cough or sneeze into your sleeve. Throw used tissues in a lined trash can, and immediately wash your hands with soap and water for at least 20 seconds or use an alcohol-based hand rub.  Wash your [facility] your hands often and thoroughly with soap and water for at least 20 seconds. You can use an alcohol-based hand sanitizer if soap and water are not available and if your hands are not visibly dirty. Avoid touching your eyes, nose, and mouth with unwashed hands.   Prevention Steps for Caregivers and Household Members of Individuals Confirmed to have, or Being Evaluated for, COVID-19 Infection Being Cared for in the Home  If you live with, or provide care at home for, a person confirmed to have, or being evaluated for, COVID-19 infection please follow these guidelines to prevent infection:  Follow healthcare providers  instructions Make sure that you understand and can help the patient follow any healthcare provider instructions for all care.  Provide for the patients basic needs You should help the patient with basic needs in the home and provide support for getting  groceries, prescriptions, and other personal needs.  Monitor the patients symptoms If they are getting sicker, call his or her medical provider and tell them that the patient has, or is being evaluated for, COVID-19 infection. This will help the healthcare providers office take steps to keep other people from getting infected. Ask the healthcare provider to call the local or state health department.  Limit the number of people who have contact with the patient If possible, have only one caregiver for the patient. Other household members should stay in another home or place of residence. If this is not possible, they should stay in another room, or be separated from the patient as much as possible. Use a separate bathroom, if available. Restrict visitors who do not have an essential need to be in the home.  Keep older adults, very young children, and other sick people away from the patient Keep older adults, very young children, and those who have compromised immune systems or chronic health conditions away from the patient. This includes people with chronic heart, lung, or kidney conditions, diabetes, and cancer.  Ensure good ventilation Make sure that shared spaces in the home have good air flow, such as from an air conditioner or an opened window, weather permitting.  Wash your hands often Wash your hands often and thoroughly with soap and water for at least 20 seconds. You can use an alcohol based hand sanitizer if soap and water are not available and if your hands are not visibly dirty. Avoid touching your eyes, nose, and mouth with unwashed hands. Use disposable paper towels to dry your hands. If not available, use dedicated cloth towels and replace them when they become wet.  Wear a facemask and gloves Wear a disposable facemask at all times in the room and gloves when you touch or have contact with the patients blood, body fluids, and/or secretions or excretions, such as sweat,  saliva, sputum, nasal mucus, vomit, urine, or feces.  Ensure the mask fits over your nose and mouth tightly, and do not touch it during use. Throw out disposable facemasks and gloves after using them. Do not reuse. Wash your hands immediately after removing your facemask and gloves. If your personal clothing becomes contaminated, carefully remove clothing and launder. Wash your hands after handling contaminated clothing. Place all used disposable facemasks, gloves, and other waste in a lined container before disposing them with other household waste. Remove gloves and wash your hands immediately after handling these items.  Do not share dishes, glasses, or other household items with the patient Avoid sharing household items. You should not share dishes, drinking glasses, cups, eating utensils, towels, bedding, or other items with a patient who is confirmed to have, or being evaluated for, COVID-19 infection. After the person uses these items, you should wash them thoroughly with soap and water.  Wash laundry thoroughly Immediately remove and wash clothes or bedding that have blood, body fluids, and/or secretions or excretions, such as sweat, saliva, sputum, nasal mucus, vomit, urine, or feces, on them. Wear gloves when handling laundry from the patient. Read and follow directions on labels of laundry or clothing items and detergent. In general, wash and dry with the warmest temperatures recommended on the label.  Clean all areas  the individual has used often Clean all touchable surfaces, such as counters, tabletops, doorknobs, bathroom fixtures, toilets, phones, keyboards, tablets, and bedside tables, every day. Also, clean any surfaces that may have blood, body fluids, and/or secretions or excretions on them. Wear gloves when cleaning surfaces the patient has come in contact with. Use a diluted bleach solution (e.g., dilute bleach with 1 part bleach and 10 parts water) or a household disinfectant  with a label that says EPA-registered for coronaviruses. To make a bleach solution at home, add 1 tablespoon of bleach to 1 quart (4 cups) of water. For a larger supply, add  cup of bleach to 1 gallon (16 cups) of water. Read labels of cleaning products and follow recommendations provided on product labels. Labels contain instructions for safe and effective use of the cleaning product including precautions you should take when applying the product, such as wearing gloves or eye protection and making sure you have good ventilation during use of the product. Remove gloves and wash hands immediately after cleaning.  Monitor yourself for signs and symptoms of illness Caregivers and household members are considered close contacts, should monitor their health, and will be asked to limit movement outside of the home to the extent possible. Follow the monitoring steps for close contacts listed on the symptom monitoring form.   ? If you have additional questions, contact your local health department or call the epidemiologist on call at [phone] (available 24/7). ? This guidance is subject to change. For the most up-to-date guidance from [facility], please refer to their website: [email or web address]

## 2018-10-16 NOTE — ED Triage Notes (Signed)
FIRST NURSE NOTE- pt from urgent care for r/o cardiac problems. Per pt they told him he had corona virus symptoms but because was also having chest pressure told him to come to ED and get checked out. Pt denies fever or travel but has had cough and body aches.  Chest pressure is central and intermittent for last about week and half but getting worse.  Unlabored. NAD

## 2018-10-26 ENCOUNTER — Encounter (HOSPITAL_COMMUNITY): Payer: Self-pay | Admitting: Emergency Medicine

## 2018-10-26 ENCOUNTER — Other Ambulatory Visit: Payer: Self-pay

## 2018-10-26 ENCOUNTER — Emergency Department (HOSPITAL_COMMUNITY)
Admission: EM | Admit: 2018-10-26 | Discharge: 2018-10-26 | Disposition: A | Discharge status: Home / Self Care | Payer: BLUE CROSS/BLUE SHIELD | Attending: Emergency Medicine | Admitting: Emergency Medicine

## 2018-10-26 DIAGNOSIS — Z79899 Other long term (current) drug therapy: Secondary | ICD-10-CM | POA: Insufficient documentation

## 2018-10-26 DIAGNOSIS — R6889 Other general symptoms and signs: Secondary | ICD-10-CM

## 2018-10-26 DIAGNOSIS — J101 Influenza due to other identified influenza virus with other respiratory manifestations: Secondary | ICD-10-CM | POA: Diagnosis not present

## 2018-10-26 DIAGNOSIS — Z7982 Long term (current) use of aspirin: Secondary | ICD-10-CM | POA: Diagnosis not present

## 2018-10-26 DIAGNOSIS — J111 Influenza due to unidentified influenza virus with other respiratory manifestations: Secondary | ICD-10-CM | POA: Insufficient documentation

## 2018-10-26 DIAGNOSIS — R05 Cough: Secondary | ICD-10-CM | POA: Diagnosis not present

## 2018-10-26 NOTE — ED Provider Notes (Addendum)
[facility] EMERGENCY DEPARTMENT Provider Note   CSN: [id number] Arrival date & time: 10/26/18  0557    History   Chief Complaint Chief Complaint  Patient presents with   Cough    HPI Daniel Garner is a 63 y.o. male.     Patient presents to the emergency department for cough, chest discomfort, generalized weakness.  This has been ongoing for 2 weeks.  He reports that he felt more weak this morning so he came to the ER.  He has not had any fever.  He is not short of breath.  No nausea, vomiting or diarrhea.     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 Authorizing Provider  aspirin EC 81 MG tablet Take 1 tablet (81 mg total) by mouth daily. 12/04/15   Steele Sizer, MD  atorvastatin (LIPITOR) 20 MG tablet Take 1 tablet (20 mg total) by mouth daily. 08/22/18   Poulose, Bethel Born, NP  Cholecalciferol (VITAMIN D-3) 1000 units CAPS Take by mouth.    [provider]  cyanocobalamin 1000 MCG tablet  Take 1,000 mcg by mouth daily.    [provider]  DOCOSAHEXAENOIC ACID PO Use 1,000 mg once daily.     [provider]  fluticasone (FLONASE) 50 MCG/ACT nasal spray Place 2 sprays into both nostrils daily. Patient not taking: Reported on 08/16/2018 10/05/17   Steele Sizer, MD  ibuprofen (ADVIL,MOTRIN) 800 MG tablet 800 mg 10/08/07   [provider]  vitamin C (ASCORBIC ACID) 500 MG tablet Take 500 mg by mouth daily.    [provider]  vitamin E 400 UNIT capsule Take 400 Units by mouth 2 (two) times daily.    [provider]    Family History Family History  Problem Relation Age of Onset   Arthritis Mother    Colon cancer Father    Prostate cancer Father     Social History Social History   Tobacco Use   Smoking status: Never Smoker   Smokeless tobacco: Never Used  Substance Use Topics   Alcohol use: No    Alcohol/week: 0.0 standard drinks   Drug use: No     Allergies   Patient has no known allergies.   Review of Systems Review of Systems  Constitutional: Positive for fatigue.  Respiratory: Positive for cough.   All other systems reviewed and are negative.    Physical Exam Updated Vital Signs BP (!) 153/95 (BP Location: Right Arm)    Pulse 80  Temp 98.2 F (36.8 C) (Oral)    Resp 16    Ht 5\' 10"  (1.778 m)    Wt 86.2 kg    SpO2 98%    BMI 27.26 kg/m   Physical Exam Vitals signs and nursing note reviewed.  Constitutional:      General: He is not in acute distress.    Appearance: Normal appearance. He is well-developed.  HENT:     Head: Normocephalic and atraumatic.     Right Ear: Hearing normal.     Left Ear: Hearing normal.     Nose: Nose normal.  Eyes:     Conjunctiva/sclera: Conjunctivae normal.     Pupils: Pupils are equal, round, and reactive to light.  Neck:     Musculoskeletal: Normal range of motion and neck supple.  Cardiovascular:     Rate and Rhythm: Regular rhythm.     Heart sounds: S1  normal and S2 normal. No murmur. No friction rub. No gallop.   Pulmonary:     Effort: Pulmonary effort is normal. No respiratory distress.     Breath sounds: Normal breath sounds.  Chest:     Chest wall: No tenderness.  Abdominal:     General: Bowel sounds are normal.     Palpations: Abdomen is soft.     Tenderness: There is no abdominal tenderness. There is no guarding or rebound. Negative signs include Murphy's sign and McBurney's sign.     Hernia: No hernia is present.  Musculoskeletal: Normal range of motion.  Skin:    General: Skin is warm and dry.     Findings: No rash.  Neurological:     Mental Status: He is alert and oriented to person, place, and time.     GCS: GCS eye subscore is 4. GCS verbal subscore is 5. GCS motor subscore is 6.     Cranial Nerves: No cranial nerve deficit.     Sensory: No sensory deficit.     Coordination: Coordination normal.  Psychiatric:        Speech: Speech normal.        Behavior: Behavior normal.        Thought Content: Thought content normal.      ED Treatments / Results  Labs (all labs ordered are listed, but only abnormal results are displayed) Labs Reviewed - No data to display  EKG EKG Interpretation  Date/Time:  Thursday October 26 2018 06:14:34 EDT Ventricular Rate:  78 PR Interval:    QRS Duration: 91 QT Interval:  384 QTC Calculation: 438 R Axis:   30 Text Interpretation:  Sinus rhythm Abnormal R-wave progression, early transition Borderline ST elevation, anterior leads No significant change since last tracing Confirmed by Orpah Greek [phone]) on 10/26/2018 6:17:52 AM   Radiology No results found.  Procedures Procedures (including critical care time)  Medications Ordered in ED Medications - No data to display   Initial Impression / Assessment and Plan / ED Course  I have reviewed the triage vital signs and the nursing notes.  Pertinent labs & imaging results that were available during my care of the  patient were reviewed by me and considered in my medical decision making (see chart for details).        Patient presents to the emergency department for evaluation of feeling like he has the flu.  He reports that he has been feeling weak, malaise, achy all over.  He has had a cough for approximately 2 weeks.  He feels more weak  today than he has previously.  He is still feeling some pain in his chest, especially with coughing.  Reviewing his records reveals that he was evaluated at [facility] emergency department for these symptoms already.  He had a very thorough work-up including negative influenza testing, negative chest x-ray, cardiac evaluation including serial troponins.  All of which were negative.  Patient was told he had COVID-19 symptoms and he should isolate.  Is not clear why he came to the ER again today.  His symptoms are essentially unchanged.  His vital signs are normal, no hypoxia.  He is breathing comfortably.  Lungs are clear.  Patient counseled once again that he needs to isolate, no work-up necessary today.  Daniel Garner was evaluated in Emergency Department on 10/26/2018 for the symptoms described in the history of present illness. He was evaluated in the context of the global COVID-19 pandemic, which necessitated consideration that the patient might be at risk for infection with the SARS-CoV-2 virus that causes COVID-19. Institutional protocols and algorithms that pertain to the evaluation of patients at risk for COVID-19 are in a state of rapid change based on information released by regulatory bodies including the CDC and federal and state organizations. These policies and algorithms were followed during the patient's care in the ED.   Final Clinical Impressions(s) / ED Diagnoses   Final diagnoses:  Flu-like symptoms    ED Discharge Orders    None       Eleuterio Dollar, Gwenyth Allegra, MD 10/26/18 [id number]    Orpah Greek, MD 10/26/18 [phone]

## 2018-10-26 NOTE — Discharge Instructions (Signed)
Person Under Monitoring Name: Daniel Garner  Location: [address] [place] [address]   Infection Prevention Recommendations for Individuals Confirmed to have, or Being Evaluated for, 2019 Novel Coronavirus (COVID-19) Infection Who Receive Care at Home  Individuals who are confirmed to have, or are being evaluated for, COVID-19 should follow the prevention steps below until a healthcare provider or local or state health department says they can return to normal activities.  Stay home except to get medical care You should restrict activities outside your home, except for getting medical care. Do not go to work, school, or public areas, and do not use public transportation or taxis.  Call ahead before visiting your doctor Before your medical appointment, call the healthcare provider and tell them that you have, or are being evaluated for, COVID-19 infection. This will help the healthcare providers office take steps to keep other people from getting infected. Ask your healthcare provider to call the local or state health department.  Monitor your symptoms Seek prompt medical attention if your illness is worsening (e.g., difficulty breathing). Before going to your medical appointment, call the healthcare provider and tell them that you have, or are being evaluated for, COVID-19 infection. Ask your healthcare provider to call the local or state health department.  Wear a facemask You should wear a facemask that covers your nose and mouth when you are in the same room with other people and when you visit a healthcare provider. People who live with or visit you should also wear a facemask while they are in the same room with you.  Separate yourself from other people in your home As much as possible, you should stay in a different room from other people in your home. Also, you should use a separate bathroom, if available.  Avoid sharing household items You should not share  dishes, drinking glasses, cups, eating utensils, towels, bedding, or other items with other people in your home. After using these items, you should wash them thoroughly with soap and water.  Cover your coughs and sneezes Cover your mouth and nose with a tissue when you cough or sneeze, or you can cough or sneeze into your sleeve. Throw used tissues in a lined trash can, and immediately wash your hands with soap and water for at least 20 seconds or use an alcohol-based hand rub.  Wash your [facility] your hands often and thoroughly with soap and water for at least 20 seconds. You can use an alcohol-based hand sanitizer if soap and water are not available and if your hands are not visibly dirty. Avoid touching your eyes, nose, and mouth with unwashed hands.   Prevention Steps for Caregivers and Household Members of Individuals Confirmed to have, or Being Evaluated for, COVID-19 Infection Being Cared for in the Home  If you live with, or provide care at home for, a person confirmed to have, or being evaluated for, COVID-19 infection please follow these guidelines to prevent infection:  Follow healthcare providers instructions Make sure that you understand and can help the patient follow any healthcare provider instructions for all care.  Provide for the patients basic needs You should help the patient with basic needs in the home and provide support for getting groceries, prescriptions, and other personal needs.  Monitor the patients symptoms If they are getting sicker, call his or her medical provider and tell them that the patient has, or is being evaluated for, COVID-19 infection. This will help the healthcare providers office  take steps to keep other people from getting infected. °Ask the healthcare provider to call the local or state health department. ° °Limit the number of people who have contact with the patient °If possible, have only one caregiver for the patient. °Other  household members should stay in another home or place of residence. If this is not possible, they should stay °in another room, or be separated from the patient as much as possible. Use a separate bathroom, if available. °Restrict visitors who do not have an essential need to be in the home. ° °Keep older adults, very young children, and other sick people away from the patient °Keep older adults, very young children, and those who have compromised immune systems or chronic health conditions away from the patient. This includes people with chronic heart, lung, or kidney conditions, diabetes, and cancer. ° °Ensure good ventilation °Make sure that shared spaces in the home have good air flow, such as from an air conditioner or an opened window, °weather permitting. ° °Wash your hands often °Wash your hands often and thoroughly with soap and water for at least 20 seconds. You can use an alcohol based hand sanitizer if soap and water are not available and if your hands are not visibly dirty. °Avoid touching your eyes, nose, and mouth with unwashed hands. °Use disposable paper towels to dry your hands. If not available, use dedicated cloth towels and replace them when they become wet. ° °Wear a facemask and gloves °Wear a disposable facemask at all times in the room and gloves when you touch or have contact with the patient’s blood, body fluids, and/or secretions or excretions, such as sweat, saliva, sputum, nasal mucus, vomit, urine, or feces.  Ensure the mask fits over your nose and mouth tightly, and do not touch it during use. °Throw out disposable facemasks and gloves after using them. Do not reuse. °Wash your hands immediately after removing your facemask and gloves. °If your personal clothing becomes contaminated, carefully remove clothing and launder. Wash your hands after handling contaminated clothing. °Place all used disposable facemasks, gloves, and other waste in a lined container before disposing them with  other household waste. °Remove gloves and wash your hands immediately after handling these items. ° °Do not share dishes, glasses, or other household items with the patient °Avoid sharing household items. You should not share dishes, drinking glasses, cups, eating utensils, towels, bedding, or other items with a patient who is confirmed to have, or being evaluated for, COVID-19 infection. °After the person uses these items, you should wash them thoroughly with soap and water. ° °Wash laundry thoroughly °Immediately remove and wash clothes or bedding that have blood, body fluids, and/or secretions or excretions, such as sweat, saliva, sputum, nasal mucus, vomit, urine, or feces, on them. °Wear gloves when handling laundry from the patient. °Read and follow directions on labels of laundry or clothing items and detergent. In general, wash and dry with the warmest temperatures recommended on the label. ° °Clean all areas the individual has used often °Clean all touchable surfaces, such as counters, tabletops, doorknobs, bathroom fixtures, toilets, phones, keyboards, tablets, and bedside tables, every day. Also, clean any surfaces that may have blood, body fluids, and/or secretions or excretions on them. °Wear gloves when cleaning surfaces the patient has come in contact with. °Use a diluted bleach solution (e.g., dilute bleach with 1 part bleach and 10 parts water) or a household disinfectant with a label that says EPA-registered for coronaviruses. To make a bleach   solution at home, add 1 tablespoon of bleach to 1 quart (4 cups) of water. For a larger supply, add  cup of bleach to 1 gallon (16 cups) of water. Read labels of cleaning products and follow recommendations provided on product labels. Labels contain instructions for safe and effective use of the cleaning product including precautions you should take when applying the product, such as wearing gloves or eye protection and making sure you have good ventilation  during use of the product. Remove gloves and wash hands immediately after cleaning.  Monitor yourself for signs and symptoms of illness Caregivers and household members are considered close contacts, should monitor their health, and will be asked to limit movement outside of the home to the extent possible. Follow the monitoring steps for close contacts listed on the symptom monitoring form.   ? If you have additional questions, contact your local health department or call the epidemiologist on call at [phone] (available 24/7). ? This guidance is subject to change. For the most up-to-date guidance from [facility], please refer to their website: [employer]Blogs.pl

## 2018-10-26 NOTE — ED Triage Notes (Signed)
C/O of cough and overall malaise. Also reports body aches. Pt states: " I just don't feel well."

## 2018-10-26 NOTE — ED Notes (Signed)
Patient verbalizes understanding of discharge instructions. Opportunity for questioning and answers were provided. Armband removed by staff, pt discharged from ED ambulatory.   

## 2018-12-06 DIAGNOSIS — G4733 Obstructive sleep apnea (adult) (pediatric): Secondary | ICD-10-CM | POA: Diagnosis not present

## 2018-12-19 ENCOUNTER — Encounter: Payer: Self-pay | Admitting: Family Medicine

## 2018-12-19 ENCOUNTER — Ambulatory Visit: Payer: BLUE CROSS/BLUE SHIELD | Admitting: Family Medicine

## 2018-12-19 ENCOUNTER — Other Ambulatory Visit: Payer: Self-pay

## 2018-12-19 VITALS — BP 112/82 | HR 70 | Temp 98.2°F | Resp 12 | Ht 70.0 in | Wt 181.9 lb

## 2018-12-19 DIAGNOSIS — G4733 Obstructive sleep apnea (adult) (pediatric): Secondary | ICD-10-CM

## 2018-12-19 DIAGNOSIS — K439 Ventral hernia without obstruction or gangrene: Secondary | ICD-10-CM | POA: Diagnosis not present

## 2018-12-19 DIAGNOSIS — E7800 Pure hypercholesterolemia, unspecified: Secondary | ICD-10-CM | POA: Diagnosis not present

## 2018-12-19 LAB — CBC WITH DIFFERENTIAL/PLATELET
Absolute Monocytes: 332 cells/uL (ref 200–950)
Basophils Absolute: 32 cells/uL (ref 0–200)
Basophils Relative: 0.8 %
Eosinophils Absolute: 80 cells/uL (ref 15–500)
Eosinophils Relative: 2 %
HCT: 41.6 % (ref 38.5–50.0)
Hemoglobin: 13.9 g/dL (ref 13.2–17.1)
Lymphs Abs: 1968 cells/uL (ref 850–3900)
MCH: 31.3 pg (ref 27.0–33.0)
MCHC: 33.4 g/dL (ref 32.0–36.0)
MCV: 93.7 fL (ref 80.0–100.0)
MPV: 10.1 fL (ref 7.5–12.5)
Monocytes Relative: 8.3 %
Neutro Abs: 1588 cells/uL (ref 1500–7800)
Neutrophils Relative %: 39.7 %
Platelets: 285 10*3/uL (ref 140–400)
RBC: 4.44 10*6/uL (ref 4.20–5.80)
RDW: 13.1 % (ref 11.0–15.0)
Total Lymphocyte: 49.2 %
WBC: 4 10*3/uL (ref 3.8–10.8)

## 2018-12-19 LAB — COMPLETE METABOLIC PANEL WITH GFR
AG Ratio: 1.8 (calc) (ref 1.0–2.5)
ALT: 18 U/L (ref 9–46)
AST: 19 U/L (ref 10–35)
Albumin: 4.4 g/dL (ref 3.6–5.1)
Alkaline phosphatase (APISO): 78 U/L (ref 35–144)
BUN: 13 mg/dL (ref 7–25)
CO2: 29 mmol/L (ref 20–32)
Calcium: 9.8 mg/dL (ref 8.6–10.3)
Chloride: 104 mmol/L (ref 98–110)
Creat: 0.99 mg/dL (ref 0.70–1.25)
GFR, Est African American: 96 mL/min/{1.73_m2} (ref >=60)
GFR, Est Non African American: 82 mL/min/{1.73_m2} (ref >=60)
Globulin: 2.5 g/dL (calc) (ref 1.9–3.7)
Glucose, Bld: 84 mg/dL (ref 65–99)
Potassium: 4.6 mmol/L (ref 3.5–5.3)
Sodium: 138 mmol/L (ref 135–146)
Total Bilirubin: 0.7 mg/dL (ref 0.2–1.2)
Total Protein: 6.9 g/dL (ref 6.1–8.1)

## 2018-12-19 NOTE — Progress Notes (Signed)
Name: Daniel Garner   MRN: [id number]    DOB: September 14, 1955   Date:12/19/2018       Progress Note  Subjective  Chief Complaint  Chief Complaint  Patient presents with  . Umbilical Hernia    onset last week, while exercising with sorness    HPI  Ventral hernia: he had inguinal hernia repair in the past. It was done at [facility]. He states one week ago he was doing upper body exercise and core, and later that day he noticed pain above his umbilicus, it was a knot that was painful . No redness, change in bowel movement or blood in stools, no fever or chills. Appetite is normal. Pain is not intense now but concerned because the mass did not go down in size.   Hyperlipidemia: he stopped Atorvastatin because it caused dizziness, low ASCVD score, explained he can stay off medication The 10-year ASCVD risk score Mikey Bussing DC Jr., et al., 2013) is: 6.2%   Values used to calculate the score:     Age: 63 years     Sex: Male     Is Non-Hispanic African American: Yes     Diabetic: No     Tobacco smoker: No     Systolic Blood Pressure: 240 mmHg     Is BP treated: No     HDL Cholesterol: 60 mg/dL     Total Cholesterol: 204 mg/dL   OSA :doing well, he wears it every night and during naps   Patient Active Problem List   Diagnosis Date Noted  . Osteoarthritis of right hip 05/27/2017  . Dyslipidemia 08/22/2016  . 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 Sleep apnea 08/16/2016  . Osteoarthritis 08/16/2016  . History of migraine 08/16/2016  . ED (erectile dysfunction) 08/16/2016  . GERD without esophagitis 08/16/2016  . 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 Prostatism 01/13/2015    Past Surgical History:  Procedure Laterality Date  . COLONOSCOPY  2000  . COLONOSCOPY WITH PROPOFOL N/A 08/06/2015   Procedure: COLONOSCOPY WITH PROPOFOL;  Surgeon: Christene Lye, MD;  Location: [facility] ENDOSCOPY;  Service: Endoscopy;  Laterality: N/A;  . HERNIA REPAIR  [id number]  .  TONSILLECTOMY      Family History  Problem Relation Age of Onset  . Arthritis Mother   . Colon cancer Father   . Prostate cancer Father     Social History   Socioeconomic History  . Marital status: Married    Spouse name: Verl Bangs  . Number of children: 3  . Years of education: Not on file  . Highest education level: Some college, no degree  Occupational History  . Not on file  Social Needs  . Financial resource strain: Not hard at all  . Food insecurity:    Worry: Never true    Inability: Never true  . Transportation needs:    Medical: No    Non-medical: No  Tobacco Use  . Smoking status: Never Smoker  . Smokeless tobacco: Never Used  Substance and Sexual Activity  . Alcohol use: No    Alcohol/week: 0.0 standard drinks  . Drug use: No  . Sexual activity: Yes    Partners: Female    Birth control/protection: None  Lifestyle  . Physical activity:    Days per week: 3 days    Minutes per session: 90 min  . Stress: Not at all  Relationships  . Social connections:    Talks on phone: Once a week  Gets together: Never    Attends religious service: More than 4 times per year    Active member of club or organization: Yes    Attends meetings of clubs or organizations: 1 to 4 times per year    Relationship status: Married  . Intimate partner violence:    Fear of current or ex partner: No    Emotionally abused: No    Physically abused: No    Forced sexual activity: No  Other Topics Concern  . Not on file  Social History Narrative  . Not on file     Current Outpatient Medications:  .  aspirin EC 81 MG tablet, Take 1 tablet (81 mg total) by mouth daily., Disp: 30 tablet, Rfl: 0 .  Cholecalciferol (VITAMIN D-3) 1000 units CAPS, Take by mouth., Disp: , Rfl:  .  cyanocobalamin 1000 MCG tablet, Take 1,000 mcg by mouth daily., Disp: , Rfl:  .  DOCOSAHEXAENOIC ACID PO, Use 1,000 mg once daily. , Disp: , Rfl:  .  fluticasone (FLONASE) 50 MCG/ACT nasal spray, Place  2 sprays into both nostrils daily., Disp: 16 g, Rfl: 2 .  ibuprofen (ADVIL,MOTRIN) 800 MG tablet, 800 mg, Disp: , Rfl:  .  vitamin C (ASCORBIC ACID) 500 MG tablet, Take 500 mg by mouth daily., Disp: , Rfl:  .  vitamin E 400 UNIT capsule, Take 400 Units by mouth 2 (two) times daily., Disp: , Rfl:  .  atorvastatin (LIPITOR) 20 MG tablet, Take 1 tablet (20 mg total) by mouth daily. (Patient not taking: Reported on 12/19/2018), Disp: 90 tablet, Rfl: 0  No Known Allergies  I personally reviewed active problem list, medication list, allergies, family history, social history with the patient/caregiver today.   ROS  Ten systems reviewed and is negative except as mentioned in HPI   Objective  Vitals:   12/19/18 1001  BP: 112/82  Pulse: 70  Resp: 12  Temp: 98.2 F (36.8 C)  TempSrc: Oral  SpO2: 97%  Weight: 181 lb 14.4 oz (82.5 kg)  Height: 5\' 10"  (1.778 m)    Body mass index is 26.1 kg/m.  Physical Exam  Constitutional: Patient appears well-developed and well-nourished.  No distress.  HEENT: head atraumatic, normocephalic, pupils equal and reactive to light,neck supple, wearing a mask  Cardiovascular: Normal rate, regular rhythm and normal heart sounds.  No murmur heard. No BLE edema. Pulmonary/Chest: Effort normal and breath sounds normal. No respiratory distress. Abdominal: Soft.  There is a 2x2 inch mass above umbilicus, no redness of increase in warmth, seems harder than an hernia, but explained needs to be evaluated  Psychiatric: Patient has a normal mood and affect. behavior is normal. Judgment and thought content normal.  Recent Results (from the past 2160 hour(s))  Basic metabolic panel     Status: Abnormal   Collection Time: 10/16/18 11:33 AM  Result Value Ref Range   Sodium 136 135 - 145 mmol/L   Potassium 3.9 3.5 - 5.1 mmol/L   Chloride 102 98 - 111 mmol/L   CO2 26 22 - 32 mmol/L   Glucose, Bld 105 (H) 70 - 99 mg/dL   BUN 11 6 - 20 mg/dL   Creatinine, Ser 0.99  0.61 - 1.24 mg/dL   Calcium 9.2 8.9 - 10.3 mg/dL   GFR calc non Af Amer >60 >60 mL/min   GFR calc Af Amer >60 >60 mL/min   Anion gap 8 5 - 15    Comment: Performed at [facility], [address] [place], [place] [address]  CBC     Status: None   Collection Time: 10/16/18 11:33 AM  Result Value Ref Range   WBC 5.9 4.0 - 10.5 K/uL   RBC 4.48 4.22 - 5.81 MIL/uL   Hemoglobin 14.2 13.0 - 17.0 g/dL   HCT 41.5 39.0 - 52.0 %   MCV 92.6 80.0 - 100.0 fL   MCH 31.7 26.0 - 34.0 pg   MCHC 34.2 30.0 - 36.0 g/dL   RDW 13.2 11.5 - 15.5 %   Platelets 277 150 - 400 K/uL   nRBC 0.0 0.0 - 0.2 %    Comment: Performed at [facility], [address] [place], [place] [address]  Troponin I - ONCE - STAT     Status: None   Collection Time: 10/16/18 11:33 AM  Result Value Ref Range   Troponin I <0.03 <0.03 ng/mL    Comment: Performed at [facility], [address] [place], [place] [address]  Influenza panel by PCR (type A & B)     Status: None   Collection Time: 10/16/18 12:51 PM  Result Value Ref Range   Influenza A By PCR NEGATIVE NEGATIVE   Influenza B By PCR NEGATIVE NEGATIVE    Comment: (NOTE) The Xpert Xpress Flu assay is intended as an aid in the diagnosis of  influenza and should not be used as a sole basis for treatment.  This  assay is FDA approved for nasopharyngeal swab specimens only. Nasal  washings and aspirates are unacceptable for Xpert Xpress Flu testing. Performed at [facility], [address] [place], [place] [address]   Troponin I - Once-Timed     Status: None   Collection Time: 10/16/18  3:12 PM  Result Value Ref Range   Troponin I <0.03 <0.03 ng/mL    Comment: Performed at [facility], [address] [place], [place] [address]      PHQ2/9: Depression screen [facility] 2/9 12/19/2018 08/16/2018 10/05/2017 05/27/2017 08/16/2016  Decreased Interest 0 0 0 0 0  Down, Depressed, Hopeless 0 0 0 0 0  PHQ - 2 Score 0 0 0 0 0   Altered sleeping 0 - - - -  Tired, decreased energy 0 - - - -  Change in appetite 0 - - - -  Feeling bad or failure about yourself  0 - - - -  Trouble concentrating 0 - - - -  Moving slowly or fidgety/restless 0 - - - -  Suicidal thoughts 0 - - - -  PHQ-9 Score 0 - - - -  Difficult doing work/chores Not difficult at all - - - -    phq 9 is negative   Fall Risk: Fall Risk  12/19/2018 08/16/2018 03/22/2018 10/05/2017 05/27/2017  Falls in the past year? 0 0 No No No  Number falls in past yr: 0 0 - - -  Injury with Fall? 0 0 - - -     Functional Status Survey: Is the patient deaf or have difficulty hearing?: No Does the patient have difficulty seeing, even when wearing glasses/contacts?: No Does the patient have difficulty concentrating, remembering, or making decisions?: No Does the patient have difficulty walking or climbing stairs?: No Does the patient have difficulty dressing or bathing?: No Does the patient have difficulty doing errands alone such as visiting a doctor's office or shopping?: No    Assessment & Plan  1. Ventral hernia without obstruction or gangrene  Versus mass, discussed checking [place] or  CT or referral and he chose to see surgeon, we will try urgent referral, check labs and also discussed signs and symptoms of incarceration and need to go to [facility] - Ambulatory referral to General Surgery - COMPLETE METABOLIC PANEL WITH GFR - CBC with Differential/Platelet

## 2018-12-20 ENCOUNTER — Telehealth: Payer: Self-pay

## 2018-12-20 NOTE — Telephone Encounter (Signed)
Copied from [id number]. Topic: Referral - Status >> Dec 20, 2018 10:13 AM Marin Olp L wrote: Reason for [id number] from [place] providing correct fax number for referral placed to [facility] for hernia. Dr. Redmond Pulling is no longer at the location though so it will be a different provider. YSH:[phone]  Information will be corrected in the referral and refaxed.

## 2018-12-28 DIAGNOSIS — K429 Umbilical hernia without obstruction or gangrene: Secondary | ICD-10-CM | POA: Diagnosis not present

## 2019-01-04 DIAGNOSIS — G4733 Obstructive sleep apnea (adult) (pediatric): Secondary | ICD-10-CM | POA: Diagnosis not present

## 2019-04-09 DIAGNOSIS — G4733 Obstructive sleep apnea (adult) (pediatric): Secondary | ICD-10-CM | POA: Diagnosis not present

## 2019-06-19 DIAGNOSIS — G4733 Obstructive sleep apnea (adult) (pediatric): Secondary | ICD-10-CM | POA: Diagnosis not present

## 2019-09-10 ENCOUNTER — Encounter: Payer: Self-pay | Admitting: Family Medicine

## 2019-09-10 ENCOUNTER — Other Ambulatory Visit: Payer: Self-pay

## 2019-09-10 ENCOUNTER — Ambulatory Visit: Payer: BC Managed Care – PPO | Admitting: Family Medicine

## 2019-09-10 VITALS — BP 132/78 | HR 89 | Temp 97.5°F | Resp 12 | Ht 70.0 in | Wt 191.6 lb

## 2019-09-10 DIAGNOSIS — E7800 Pure hypercholesterolemia, unspecified: Secondary | ICD-10-CM

## 2019-09-10 DIAGNOSIS — K219 Gastro-esophageal reflux disease without esophagitis: Secondary | ICD-10-CM

## 2019-09-10 DIAGNOSIS — G4733 Obstructive sleep apnea (adult) (pediatric): Secondary | ICD-10-CM

## 2019-09-10 DIAGNOSIS — H938X2 Other specified disorders of left ear: Secondary | ICD-10-CM

## 2019-09-10 DIAGNOSIS — K429 Umbilical hernia without obstruction or gangrene: Secondary | ICD-10-CM | POA: Diagnosis not present

## 2019-09-10 DIAGNOSIS — H6122 Impacted cerumen, left ear: Secondary | ICD-10-CM

## 2019-09-10 DIAGNOSIS — M1611 Unilateral primary osteoarthritis, right hip: Secondary | ICD-10-CM

## 2019-09-10 DIAGNOSIS — E559 Vitamin D deficiency, unspecified: Secondary | ICD-10-CM

## 2019-09-10 NOTE — Progress Notes (Signed)
Name: Daniel Garner   MRN: [id number]    DOB: [birth year]   Date:09/10/2019       Progress Note  Subjective  Chief Complaint  Chief Complaint  Patient presents with  . Ear Fullness    Onset 3 weeks ago  . Hip Pain    Rt hip pain onset 2 weeks ago.  Would like a referral    HPI  Ear fullness: he noticed some left side ear fullness , not associated with dizziness or ear pain ( except for one day after trying to flush his ear)  Occasionally feels like there is a little buzzing sensation. No fever, chills, rhinorrhea or nasal congestion   OSA: he is very compliant with CPAP machine, wakes up feeling refreshed no headaches unless if it is time to replaced supplies  Chronic right hip : he was seen by Ortho in the past, he has OA right hip, but states he has noticed some increase in pain during activity since the weather got colder , pain is on the right groin area. He would like to see ortho in [place] and possibly have a replacement this Summer  Umbilical hernia: seen by Dr. Shawna Orleans and given reassurance, he states no longer causing problems and wants to hold off on surgery   Hypercholesterolemia: he will return for CPE and labs   Patient Active Problem List   Diagnosis Date Noted  . Osteoarthritis of right hip 05/27/2017  . Dyslipidemia 08/22/2016  . 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 Sleep apnea 08/16/2016  . Osteoarthritis 08/16/2016  . History of migraine 08/16/2016  . ED (erectile dysfunction) 08/16/2016  . GERD without esophagitis 08/16/2016  . 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 Prostatism 01/13/2015    Past Surgical History:  Procedure Laterality Date  . COLONOSCOPY  2000  . COLONOSCOPY WITH PROPOFOL N/A 08/06/2015   Procedure: COLONOSCOPY WITH PROPOFOL;  Surgeon: Christene Lye, MD;  Location: [facility] ENDOSCOPY;  Service: Endoscopy;  Laterality: N/A;  . HERNIA REPAIR  [id number]  . TONSILLECTOMY      Family History  Problem  Relation Age of Onset  . Arthritis Mother   . Colon cancer Father   . Prostate cancer Father     Social History   Tobacco Use  . Smoking status: Never Smoker  . Smokeless tobacco: Never Used  Substance Use Topics  . Alcohol use: No    Alcohol/week: 0.0 standard drinks  . Drug use: No     Current Outpatient Medications:  .  aspirin EC 81 MG tablet, Take 1 tablet (81 mg total) by mouth daily., Disp: 30 tablet, Rfl: 0 .  Cholecalciferol (VITAMIN D-3) 1000 units CAPS, Take by mouth., Disp: , Rfl:  .  cyanocobalamin 1000 MCG tablet, Take 1,000 mcg by mouth daily., Disp: , Rfl:  .  DOCOSAHEXAENOIC ACID PO, Use 1,000 mg once daily. , Disp: , Rfl:  .  ibuprofen (ADVIL,MOTRIN) 800 MG tablet, 800 mg, Disp: , Rfl:  .  vitamin C (ASCORBIC ACID) 500 MG tablet, Take 500 mg by mouth daily., Disp: , Rfl:  .  vitamin E 400 UNIT capsule, Take 400 Units by mouth 2 (two) times daily., Disp: , Rfl:  .  fluticasone (FLONASE) 50 MCG/ACT nasal spray, Place 2 sprays into both nostrils daily. (Patient not taking: Reported on 09/10/2019), Disp: 16 g, Rfl: 2  No Known Allergies  I personally reviewed active problem list, medication list, allergies, family history, social history, health maintenance with the  patient/caregiver today.   ROS  Constitutional: Negative for fever or weight change.  Respiratory: Negative for cough and shortness of breath.   Cardiovascular: Negative for chest pain or palpitations.  Gastrointestinal: Negative for abdominal pain, no bowel changes.  Musculoskeletal: Negative for gait problem or joint swelling.  Skin: Negative for rash.  Neurological: Negative for dizziness or headache.  No other specific complaints in a complete review of systems (except as listed in HPI above).  Objective  Vitals:   09/10/19 1059  BP: 132/78  Pulse: 89  Resp: 12  Temp: (!) 97.5 F (36.4 C)  SpO2: 98%  Weight: 191 lb 9.6 oz (86.9 kg)  Height: 5\' 10"  (1.778 m)    Body mass index is  27.49 kg/m.  Physical Exam  Constitutional: Patient appears well-developed and well-nourished. Overweight.  No distress.  HEENT: head atraumatic, normocephalic, pupils equal and reactive to light, left ear canal with a lot of wax Cardiovascular: Normal rate, regular rhythm and normal heart sounds.  No murmur heard. No BLE edema. Pulmonary/Chest: Effort normal and breath sounds normal. No respiratory distress. Abdominal: Soft.  There is no tenderness. Psychiatric: Patient has a normal mood and affect. behavior is normal. Judgment and thought content normal. Muscular Skeletal: decrease rom of hip, and antalgic gait  PHQ2/9: Depression screen [facility] 2/9 09/10/2019 12/19/2018 08/16/2018 10/05/2017 05/27/2017  Decreased Interest 0 0 0 0 0  Down, Depressed, Hopeless 0 0 0 0 0  PHQ - 2 Score 0 0 0 0 0  Altered sleeping 0 0 - - -  Tired, decreased energy 0 0 - - -  Change in appetite 0 0 - - -  Feeling bad or failure about yourself  0 0 - - -  Trouble concentrating 0 0 - - -  Moving slowly or fidgety/restless 0 0 - - -  Suicidal thoughts 0 0 - - -  PHQ-9 Score 0 0 - - -  Difficult doing work/chores Not difficult at all Not difficult at all - - -    phq 9 is negative   Fall Risk: Fall Risk  09/10/2019 12/19/2018 08/16/2018 03/22/2018 10/05/2017  Falls in the past year? 0 0 0 No No  Number falls in past yr: 0 0 0 - -  Injury with Fall? 0 0 0 - -     Functional Status Survey: Is the patient deaf or have difficulty hearing?: No Does the patient have difficulty seeing, even when wearing glasses/contacts?: No Does the patient have difficulty concentrating, remembering, or making decisions?: No Does the patient have difficulty walking or climbing stairs?: Yes(a little rt hip pain) Does the patient have difficulty dressing or bathing?: No Does the patient have difficulty doing errands alone such as visiting a doctor's office or shopping?: No   Assessment & Plan  1. Obstructive sleep apnea  syndrome  Continue CPAP   2. Umbilical hernia without obstruction or gangrene  Monitor seen by surgeon   3. Pure hypercholesterolemia  Recheck labs during CPE  4. Vitamin D deficiency  Continue supplementation   5. GERD without esophagitis   6. Primary osteoarthritis of right hip  - Ambulatory referral to Orthopedic Surgery  7. Ear fullness, left  - Ear Lavage  8. Impacted cerumen of left ear  Verbal consent given Possible side effects discussed with patient Ears were  lavaged with warm water and peroxide  Patient tolerated procedure well No complications

## 2019-10-03 ENCOUNTER — Other Ambulatory Visit: Payer: Self-pay

## 2019-10-03 ENCOUNTER — Encounter: Payer: Self-pay | Admitting: Family Medicine

## 2019-10-03 ENCOUNTER — Ambulatory Visit (INDEPENDENT_AMBULATORY_CARE_PROVIDER_SITE_OTHER): Payer: BC Managed Care – PPO | Admitting: Family Medicine

## 2019-10-03 VITALS — BP 110/76 | HR 96 | Temp 97.5°F | Resp 16 | Ht 68.5 in | Wt 188.4 lb

## 2019-10-03 DIAGNOSIS — E559 Vitamin D deficiency, unspecified: Secondary | ICD-10-CM | POA: Diagnosis not present

## 2019-10-03 DIAGNOSIS — Z79899 Other long term (current) drug therapy: Secondary | ICD-10-CM | POA: Diagnosis not present

## 2019-10-03 DIAGNOSIS — Z0000 Encounter for general adult medical examination without abnormal findings: Secondary | ICD-10-CM | POA: Diagnosis not present

## 2019-10-03 DIAGNOSIS — N401 Enlarged prostate with lower urinary tract symptoms: Secondary | ICD-10-CM | POA: Diagnosis not present

## 2019-10-03 DIAGNOSIS — Z131 Encounter for screening for diabetes mellitus: Secondary | ICD-10-CM | POA: Diagnosis not present

## 2019-10-03 DIAGNOSIS — E7800 Pure hypercholesterolemia, unspecified: Secondary | ICD-10-CM | POA: Diagnosis not present

## 2019-10-03 DIAGNOSIS — R030 Elevated blood-pressure reading, without diagnosis of hypertension: Secondary | ICD-10-CM

## 2019-10-03 DIAGNOSIS — N138 Other obstructive and reflux uropathy: Secondary | ICD-10-CM

## 2019-10-03 NOTE — Progress Notes (Signed)
Name: Daniel Garner   MRN: [id number]    DOB: 1955/12/19   Date:10/03/2019       Progress Note  Subjective  Chief Complaint  Chief Complaint  Patient presents with  . Annual Exam    HPI  Patient presents for annual CPE   Elevated bp his father just had COVID-19 and recovered but was diagnosed with lung cancer two weeks ago , he is 64 yo, and he is worried about him  USPSTF grade A and B recommendations:  Diet: he is eating more balanced meals recently  Exercise: walks on treadmill and lifts weights twice a week   Depression: phq 9 is negative Depression screen [facility] 2/9 10/03/2019 09/10/2019 12/19/2018 08/16/2018 10/05/2017  Decreased Interest 0 0 0 0 0  Down, Depressed, Hopeless 0 0 0 0 0  PHQ - 2 Score 0 0 0 0 0  Altered sleeping 0 0 0 - -  Tired, decreased energy 0 0 0 - -  Change in appetite 0 0 0 - -  Feeling bad or failure about yourself  0 0 0 - -  Trouble concentrating 0 0 0 - -  Moving slowly or fidgety/restless 0 0 0 - -  Suicidal thoughts 0 0 0 - -  PHQ-9 Score 0 0 0 - -  Difficult doing work/chores - Not difficult at all Not difficult at all - -    Hypertension:  BP Readings from Last 3 Encounters:  10/03/19 110/76  09/10/19 132/78  12/19/18 112/82    Obesity: Wt Readings from Last 3 Encounters:  10/03/19 188 lb 6.4 oz (85.5 kg)  09/10/19 191 lb 9.6 oz (86.9 kg)  12/19/18 181 lb 14.4 oz (82.5 kg)   BMI Readings from Last 3 Encounters:  10/03/19 28.23 kg/m  09/10/19 27.49 kg/m  12/19/18 26.10 kg/m     Lipids:  Lab Results  Component Value Date   CHOL 204 (H) 08/16/2018   CHOL 232 (H) 08/16/2016   Lab Results  Component Value Date   HDL 60 08/16/2018   HDL 60 08/16/2016   Lab Results  Component Value Date   LDLCALC 128 (H) 08/16/2018   LDLCALC 157 (H) 08/16/2016   Lab Results  Component Value Date   TRIG 72 08/16/2018   TRIG 74 08/16/2016   Lab Results  Component Value Date   CHOLHDL 3.4 08/16/2018   CHOLHDL 3.9 08/16/2016   No  results found for: LDLDIRECT Glucose:  Glucose, Bld  Date Value Ref Range Status  12/19/2018 84 65 - 99 mg/dL Final    Comment:    .            Fasting reference interval .   10/16/2018 105 (H) 70 - 99 mg/dL Final  08/16/2018 81 65 - 99 mg/dL Final    Comment:    .            Fasting reference interval .       [facility] Visit from 09/10/2019 in [facility]  AUDIT-C Score  0       Married STD testing and prevention (HIV/chl/gon/syphilis): N/A Hep C: up to date   Skin cancer: Discussed monitoring for atypical lesions Colorectal cancer: up to date, repeat in 2027  Prostate cancer: he has BPH and we will recheck PSA   Lab Results  Component Value Date   PSA 2.4 08/16/2016    IPSS Questionnaire (AUA-7): Over the past month.   1)  How often have you had a  sensation of not emptying your bladder completely after you finish urinating?  0 - Not at all  2)  How often have you had to urinate again less than two hours after you finished urinating? 1 - Less than 1 time in 5  3)  How often have you found you stopped and started again several times when you urinated?  1 - Less than 1 time in 5  4) How difficult have you found it to postpone urination?  0 - Not at all  5) How often have you had a weak urinary stream?  0 - Not at all  6) How often have you had to push or strain to begin urination?  0 - Not at all  7) How many times did you most typically get up to urinate from the time you went to bed until the time you got up in the morning?  1 - 1 time  Total score:  0-7 mildly symptomatic   8-19 moderately symptomatic   20-35 severely symptomatic    Lung cancer:  Low Dose CT Chest recommended if Age 35-80 years, 30 pack-year currently smoking OR have quit w/in 15years. Patient does not qualify.   AAA: The USPSTF recommends one-time screening with ultrasonography in men ages 39 to 28 years who have ever smoked ECG:  10/2018   Advanced Care Planning: A voluntary  discussion about advance care planning including the explanation and discussion of advance directives.  Discussed health care proxy and Living will, and the patient was able to identify a health care proxy as wife   Patient does not have a living will at present time.   Patient Active Problem List   Diagnosis Date Noted  . Osteoarthritis of right hip 05/27/2017  . Dyslipidemia 08/22/2016  . 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 Sleep apnea 08/16/2016  . Osteoarthritis 08/16/2016  . History of migraine 08/16/2016  . ED (erectile dysfunction) 08/16/2016  . GERD without esophagitis 08/16/2016  . 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 Prostatism 01/13/2015    Past Surgical History:  Procedure Laterality Date  . COLONOSCOPY  2000  . COLONOSCOPY WITH PROPOFOL N/A 08/06/2015   Procedure: COLONOSCOPY WITH PROPOFOL;  Surgeon: Christene Lye, MD;  Location: [facility] ENDOSCOPY;  Service: Endoscopy;  Laterality: N/A;  . HERNIA REPAIR  [id number]  . TONSILLECTOMY      Family History  Problem Relation Age of Onset  . Arthritis Mother   . Colon cancer Father   . Prostate cancer Father   . Lung cancer Father     Social History   Socioeconomic History  . Marital status: Married    Spouse name: Verl Bangs  . Number of children: 3  . Years of education: Not on file  . Highest education level: Some college, no degree  Occupational History  . Occupation: Librarian, academic   Tobacco Use  . Smoking status: Never Smoker  . Smokeless tobacco: Never Used  Substance and Sexual Activity  . Alcohol use: No    Alcohol/week: 0.0 standard drinks  . Drug use: No  . Sexual activity: Yes    Partners: Female    Birth control/protection: None  Other Topics Concern  . Not on file  Social History Narrative  . Not on file   Social Determinants of Health   Financial Resource Strain: Low Risk   . Difficulty of Paying Living Expenses: Not hard at all  Food Insecurity: No  Food Insecurity  . Worried About Running  Out of Food in the Last Year: Never true  . Ran Out of Food in the Last Year: Never true  Transportation Needs: No Transportation Needs  . Lack of Transportation (Medical): No  . Lack of Transportation (Non-Medical): No  Physical Activity: Sufficiently Active  . Days of Exercise per Week: 2 days  . Minutes of Exercise per Session: 80 min  Stress: No Stress Concern Present  . Feeling of Stress : Not at all  Social Connections: Not Isolated  . Frequency of Communication with Friends and Family: More than three times a week  . Frequency of Social Gatherings with Friends and Family: Twice a week  . Attends Religious Services: More than 4 times per year  . Active Member of Clubs or Organizations: Yes  . Attends Archivist Meetings: More than 4 times per year  . Marital Status: Married  Human resources officer Violence: Not At Risk  . Fear of Current or Ex-Partner: No  . Emotionally Abused: No  . Physically Abused: No  . Sexually Abused: No     Current Outpatient Medications:  .  aspirin EC 81 MG tablet, Take 1 tablet (81 mg total) by mouth daily., Disp: 30 tablet, Rfl: 0 .  Cholecalciferol (VITAMIN D-3) 1000 units CAPS, Take by mouth., Disp: , Rfl:  .  cyanocobalamin 1000 MCG tablet, Take 1,000 mcg by mouth daily., Disp: , Rfl:  .  ibuprofen (ADVIL,MOTRIN) 800 MG tablet, 800 mg, Disp: , Rfl:  .  vitamin C (ASCORBIC ACID) 500 MG tablet, Take 500 mg by mouth daily., Disp: , Rfl:  .  DOCOSAHEXAENOIC ACID PO, Use 1,000 mg once daily. , Disp: , Rfl:   No Known Allergies   ROS  Constitutional: Negative for fever or weight change.  Respiratory: Negative for cough and shortness of breath.   Cardiovascular: Negative for chest pain or palpitations.  Gastrointestinal: Negative for abdominal pain, no bowel changes.  Musculoskeletal: Negative for gait problem or joint swelling.  Skin: Negative for rash.  Neurological: Negative for dizziness or  headache.  No other specific complaints in a complete review of systems (except as listed in HPI above).  Objective  Vitals:   10/03/19 1513 10/03/19 1519 10/03/19 1555  BP: (!) 126/94 (!) 150/100 110/76  Pulse: 96    Resp: 16    Temp: (!) 97.5 F (36.4 C)    TempSrc: Temporal    SpO2: 96%    Weight: 188 lb 6.4 oz (85.5 kg)    Height: 5' 8.5" (1.74 m)      Body mass index is 28.23 kg/m.  Physical Exam  Constitutional: Patient appears well-developed and well-nourished. No distress.  HENT: Head: Normocephalic and atraumatic. Ears: B TMs ok, no erythema or effusion; Nose: Nose normal. Mouth/Throat: not done Eyes: Conjunctivae and EOM are normal. Pupils are equal, round, and reactive to light. No scleral icterus.  Neck: Normal range of motion. Neck supple. No JVD present. No thyromegaly present.  Cardiovascular: Normal rate, regular rhythm and normal heart sounds.  No murmur heard. No BLE edema. Pulmonary/Chest: Effort normal and breath sounds normal. No respiratory distress. Abdominal: Soft. Bowel sounds are normal, no distension. There is no tenderness. no masses MALE GENITALIA: Normal descended testes bilaterally, no masses palpated, no hernias, no lesions, no discharge RECTAL: Prostate normal enlarged in size normal consistency, no rectal masses or hemorrhoids Musculoskeletal: Normal range of motion, no joint effusions. No gross deformities Neurological: he is alert and oriented to person, place, and time. No cranial nerve  deficit. Coordination, balance, strength, speech and gait are normal.  Skin: Skin is warm and dry. No rash noted. No erythema.  Psychiatric: Patient has a normal mood and affect. behavior is normal. Judgment and thought content normal.  PHQ2/9:  Fall Risk: Fall Risk  10/03/2019 09/10/2019 12/19/2018 08/16/2018 03/22/2018  Falls in the past year? 0 0 0 0 No  Number falls in past yr: 0 0 0 0 -  Injury with Fall? 0 0 0 0 -    Functional Status Survey: Is the  patient deaf or have difficulty hearing?: No Does the patient have difficulty seeing, even when wearing glasses/contacts?: No Does the patient have difficulty concentrating, remembering, or making decisions?: No Does the patient have difficulty walking or climbing stairs?: No Does the patient have difficulty dressing or bathing?: No Does the patient have difficulty doing errands alone such as visiting a doctor's office or shopping?: No    Assessment & Plan  1. Well adult exam  - Lipid panel - CBC with Differential/Platelet - COMPLETE METABOLIC PANEL WITH GFR - Hemoglobin A1c - PSA  2. Vitamin D deficiency  - VITAMIN D 25 Hydroxy (Vit-D Deficiency, Fractures)  3. Pure hypercholesterolemia  - Lipid panel  4. BPH with obstruction/lower urinary tract symptoms  - PSA  5. Diabetes mellitus screening  - Hemoglobin A1c  6. Long-term use of high-risk medication  - CBC with Differential/Platelet - COMPLETE METABOLIC PANEL WITH GFR  7. Elevated blood pressure reading  But normal before he left he will monitor and return sooner if needed, worried about his father  -Prostate cancer screening and PSA options (with potential risks and benefits of testing vs not testing) were discussed along with recent recs/guidelines. -USPSTF grade A and B recommendations reviewed with patient; age-appropriate recommendations, preventive care, screening tests, etc discussed and encouraged; healthy living encouraged; see AVS for patient education given to patient -Discussed importance of 150 minutes of physical activity weekly, eat two servings of fish weekly, eat one serving of tree nuts ( cashews, pistachios, pecans, almonds.Marland Kitchen) every other day, eat 6 servings of fruit/vegetables daily and drink plenty of water and avoid sweet beverages.

## 2019-10-03 NOTE — Patient Instructions (Signed)
Preventive Care 41-62 Years Old, Male Preventive care refers to lifestyle choices and visits with your health care provider that can promote health and wellness. This includes:  A yearly physical exam. This is also called an annual well check.  Regular dental and eye exams.  Immunizations.  Screening for certain conditions.  Healthy lifestyle choices, such as eating a healthy diet, getting regular exercise, not using drugs or products that contain nicotine and tobacco, and limiting alcohol use. What can I expect for my preventive care visit? Physical exam Your health care provider will check:  Height and weight. These may be used to calculate body mass index (BMI), which is a measurement that tells if you are at a healthy weight.  Heart rate and blood pressure.  Your skin for abnormal spots. Counseling Your health care provider may ask you questions about:  Alcohol, tobacco, and drug use.  Emotional well-being.  Home and relationship well-being.  Sexual activity.  Eating habits.  Work and work Statistician. What immunizations do I need?  Influenza (flu) vaccine  This is recommended every year. Tetanus, diphtheria, and pertussis (Tdap) vaccine  You may need a Td booster every 10 years. Varicella (chickenpox) vaccine  You may need this vaccine if you have not already been vaccinated. Zoster (shingles) vaccine  You may need this after age 62. Measles, mumps, and rubella (MMR) vaccine  You may need at least one dose of MMR if you were born in 1957 or later. You may also need a second dose. Pneumococcal conjugate (PCV13) vaccine  You may need this if you have certain conditions and were not previously vaccinated. Pneumococcal polysaccharide (PPSV23) vaccine  You may need one or two doses if you smoke cigarettes or if you have certain conditions. Meningococcal conjugate (MenACWY) vaccine  You may need this if you have certain conditions. Hepatitis A  vaccine  You may need this if you have certain conditions or if you travel or work in places where you may be exposed to hepatitis A. Hepatitis B vaccine  You may need this if you have certain conditions or if you travel or work in places where you may be exposed to hepatitis B. Haemophilus influenzae type b (Hib) vaccine  You may need this if you have certain risk factors. Human papillomavirus (HPV) vaccine  If recommended by your health care provider, you may need three doses over 6 months. You may receive vaccines as individual doses or as more than one vaccine together in one shot (combination vaccines). Talk with your health care provider about the risks and benefits of combination vaccines. What tests do I need? Blood tests  Lipid and cholesterol levels. These may be checked every 5 years, or more frequently if you are over 62 years old.  Hepatitis C test.  Hepatitis B test. Screening  Lung cancer screening. You may have this screening every year starting at age 62 if you have a 30-pack-year history of smoking and currently smoke or have quit within the past 15 years.  Prostate cancer screening. Recommendations will vary depending on your family history and other risks.  Colorectal cancer screening. All adults should have this screening starting at age 62 and continuing until age 2. Your health care provider may recommend screening at age 62 if you are at increased risk. You will have tests every 1-10 years, depending on your results and the type of screening test.  Diabetes screening. This is done by checking your blood sugar (glucose) after you have not eaten  for a while (fasting). You may have this done every 1-3 years.  Sexually transmitted disease (STD) testing. Follow these instructions at home: Eating and drinking  Eat a diet that includes fresh fruits and vegetables, whole grains, lean protein, and low-fat dairy products.  Take vitamin and mineral supplements as  recommended by your health care provider.  Do not drink alcohol if your health care provider tells you not to drink.  If you drink alcohol: ? Limit how much you have to 0-2 drinks a day. ? Be aware of how much alcohol is in your drink. In the [place] one drink equals one 12 oz bottle of beer (355 mL), one 5 oz glass of wine (148 mL), or one 1 oz glass of hard liquor (44 mL). Lifestyle  Take daily care of your teeth and gums.  Stay active. Exercise for at least 30 minutes on 5 or more days each week.  Do not use any products that contain nicotine or tobacco, such as cigarettes, e-cigarettes, and chewing tobacco. If you need help quitting, ask your health care provider.  If you are sexually active, practice safe sex. Use a condom or other form of protection to prevent STIs (sexually transmitted infections).  Talk with your health care provider about taking a low-dose aspirin every day starting at age 62. What's next?  Go to your health care provider once a year for a well check visit.  Ask your health care provider how often you should have your eyes and teeth checked.  Stay up to date on all vaccines. This information is not intended to replace advice given to you by your health care provider. Make sure you discuss any questions you have with your health care provider. Document Revised: 07/06/2018 Document Reviewed: 07/06/2018 Elsevier Patient Education  2020 [employer].

## 2019-10-04 LAB — COMPLETE METABOLIC PANEL WITH GFR
AG Ratio: 1.9 (calc) (ref 1.0–2.5)
ALT: 20 U/L (ref 9–46)
AST: 18 U/L (ref 10–35)
Albumin: 4.6 g/dL (ref 3.6–5.1)
Alkaline phosphatase (APISO): 79 U/L (ref 35–144)
BUN: 12 mg/dL (ref 7–25)
CO2: 29 mmol/L (ref 20–32)
Calcium: 10 mg/dL (ref 8.6–10.3)
Chloride: 103 mmol/L (ref 98–110)
Creat: 0.98 mg/dL (ref 0.70–1.25)
GFR, Est African American: 96 mL/min/{1.73_m2} (ref >=60)
GFR, Est Non African American: 83 mL/min/{1.73_m2} (ref >=60)
Globulin: 2.4 g/dL (calc) (ref 1.9–3.7)
Glucose, Bld: 81 mg/dL (ref 65–99)
Potassium: 4.6 mmol/L (ref 3.5–5.3)
Sodium: 139 mmol/L (ref 135–146)
Total Bilirubin: 0.7 mg/dL (ref 0.2–1.2)
Total Protein: 7 g/dL (ref 6.1–8.1)

## 2019-10-04 LAB — LIPID PANEL
Cholesterol: 214 mg/dL — ABNORMAL HIGH (ref <200)
HDL: 64 mg/dL (ref >=40)
LDL Cholesterol (Calc): 136 mg/dL (calc) — ABNORMAL HIGH
Non-HDL Cholesterol (Calc): 150 mg/dL (calc) — ABNORMAL HIGH (ref <130)
Total CHOL/HDL Ratio: 3.3 (calc) (ref <5.0)
Triglycerides: 52 mg/dL (ref <150)

## 2019-10-04 LAB — CBC WITH DIFFERENTIAL/PLATELET
Absolute Monocytes: 516 cells/uL (ref 200–950)
Basophils Absolute: 42 cells/uL (ref 0–200)
Basophils Relative: 0.7 %
Eosinophils Absolute: 72 cells/uL (ref 15–500)
Eosinophils Relative: 1.2 %
HCT: 42.9 % (ref 38.5–50.0)
Hemoglobin: 14.2 g/dL (ref 13.2–17.1)
Lymphs Abs: 2376 cells/uL (ref 850–3900)
MCH: 31.1 pg (ref 27.0–33.0)
MCHC: 33.1 g/dL (ref 32.0–36.0)
MCV: 93.9 fL (ref 80.0–100.0)
MPV: 9.7 fL (ref 7.5–12.5)
Monocytes Relative: 8.6 %
Neutro Abs: 2994 cells/uL (ref 1500–7800)
Neutrophils Relative %: 49.9 %
Platelets: 268 10*3/uL (ref 140–400)
RBC: 4.57 10*6/uL (ref 4.20–5.80)
RDW: 12.7 % (ref 11.0–15.0)
Total Lymphocyte: 39.6 %
WBC: 6 10*3/uL (ref 3.8–10.8)

## 2019-10-04 LAB — VITAMIN D 25 HYDROXY (VIT D DEFICIENCY, FRACTURES): Vit D, 25-Hydroxy: 24 ng/mL — ABNORMAL LOW (ref 30–100)

## 2019-10-04 LAB — PSA: PSA: 2.4 ng/mL (ref <=4.0)

## 2019-10-04 LAB — HEMOGLOBIN A1C
Hgb A1c MFr Bld: 5.5 % of total Hgb (ref <5.7)
Mean Plasma Glucose: 111 (calc)
eAG (mmol/L): 6.2 (calc)

## 2019-10-17 DIAGNOSIS — Z20828 Contact with and (suspected) exposure to other viral communicable diseases: Secondary | ICD-10-CM | POA: Diagnosis not present

## 2019-11-06 ENCOUNTER — Telehealth: Payer: Self-pay

## 2019-11-06 NOTE — Telephone Encounter (Signed)
Copied from [place] [phone]. Topic: General - Other >> Nov 06, 2019  2:34 PM Celene Kras wrote: Reason for CRM: Pt called and is requesting to have a nurse give him a call back regarding his hip surgery. Pt states that he never heard anything regarding the surgery. Please advise.

## 2019-11-06 NOTE — Telephone Encounter (Signed)
Called and informed patient about hip referral on 09/10/2019,  [facility] states they never received the referral. Kristeen Miss faxed his referral once again to [phone]. [facility] on [address]. Patient informed to call and make sure the referral was received and call us back if there is any further issues with the referral.

## 2019-11-06 NOTE — Telephone Encounter (Signed)
Tiffany,  I called to check the status of this referral and the guy told me they never got it and that the fax # in Epic was incorrect.  This is the correct fax [phone].  I will send it out today.

## 2019-11-28 DIAGNOSIS — M1611 Unilateral primary osteoarthritis, right hip: Secondary | ICD-10-CM | POA: Diagnosis not present

## 2019-11-28 DIAGNOSIS — M1612 Unilateral primary osteoarthritis, left hip: Secondary | ICD-10-CM | POA: Diagnosis not present

## 2019-12-05 ENCOUNTER — Ambulatory Visit: Payer: BC Managed Care – PPO | Admitting: Family Medicine

## 2020-02-12 ENCOUNTER — Ambulatory Visit: Payer: Self-pay | Admitting: *Deleted

## 2020-02-12 NOTE — Telephone Encounter (Signed)
Pt called in c/o testicular pain that is intermittent in both testicules for the last 2-3 weeks.   Denies urinary symptoms:  Blood in urine, burning, coloration, or smell. He does have a history of bilateral inguinal hernia repairs with mesh. No injuries or accidents to genital area.  Due to no available appts at [facility] and his schedule I suggested he go to the urgent care center.  He was agreeable to this.  "I get off of work early today so I could go this afternoon".     I went over the symptoms to be aware of for seeking care in the ED.   I went over the care advice.   He verbalized understanding and was agreeable to this plan.  (While talking with him a bee flew into the window of his truck and stung him on the under side of his elbow.   No history of reactions.   "It's been 30 years since I've been stung".   I went over the s/s to be aware of and go to the ED if necessary.   He was fine at the end of the triage call other than it was starting to swell a little).      Reason for Disposition . [1] Brief pain in scrotum or testicle AND [2] present < 1 hour AND [3] recurrent  (NO swelling)  Answer Assessment - Initial Assessment Questions 1. LOCATION and RADIATION: "Where is the pain located?"      I'm aching in my testicules on and off for a couple of weeks.   It's intermittent.   I'm sleeping with pillows under my knees which helps the pain at night.   2. QUALITY: "What does the pain feel like?"  (e.g., sharp, dull, aching, burning)     Aching.   I feel it in both sides.     I have aching in my legs too.   My legs have been aching since last March when I had COVID-19.   3. SEVERITY: "How bad is the pain?"  (Scale 1-10; or mild, moderate, severe)   - MILD (1-3): doesn't interfere with normal activities    - MODERATE (4-7): interferes with normal activities (e.g., work or school) or awakens from sleep   - SEVERE (8-10): excruciating pain, unable to do any normal  activities, difficulty walking     Mild  Intermittent 4. ONSET: "When did the pain start?"     2-3 weeks ago 5. PATTERN: "Does it come and go, or has it been constant since it started?"     intermittent 6. SCROTAL APPEARANCE: "What does the scrotum look like?" "Is there any swelling or redness?"      No swelling.  It doesn't hurt when I feel my testicles.   It feels like blue balls best way to describe it. 7. HERNIA: "Has a doctor ever told you that you have a hernia?"     I do have a hernia.   I've had hernia surgeries on both sides.   I have a hernia in my abd that I've not had repaired from last year.     8. OTHER SYMPTOMS: "Do you have any other symptoms?" (e.g., fever, abdominal pain, vomiting, difficulty passing urine)     I was using something for erection dysfunction.  Extense.  Protocols used: SCROTAL PAIN-A-AH

## 2020-02-14 ENCOUNTER — Other Ambulatory Visit: Payer: Self-pay

## 2020-02-14 ENCOUNTER — Ambulatory Visit: Payer: BC Managed Care – PPO | Admitting: Internal Medicine

## 2020-02-14 ENCOUNTER — Encounter: Payer: Self-pay | Admitting: Internal Medicine

## 2020-02-14 VITALS — BP 134/76 | HR 86 | Temp 97.8°F | Resp 16 | Ht 70.0 in | Wt 191.4 lb

## 2020-02-14 DIAGNOSIS — N451 Epididymitis: Secondary | ICD-10-CM | POA: Diagnosis not present

## 2020-02-14 LAB — POCT URINALYSIS DIPSTICK
Appearance: NORMAL
Bilirubin, UA: NEGATIVE
Blood, UA: NEGATIVE
Glucose, UA: NEGATIVE
Ketones, UA: NEGATIVE
Leukocytes, UA: NEGATIVE
Nitrite, UA: NEGATIVE
Protein, UA: NEGATIVE
Spec Grav, UA: 1.01 (ref 1.010–1.025)
Urobilinogen, UA: 0.2 E.U./dL
pH, UA: 8 (ref 5.0–8.0)

## 2020-02-14 MED ORDER — SULFAMETHOXAZOLE-TRIMETHOPRIM 800-160 MG PO TABS
1.0000 | ORAL_TABLET | Freq: Two times a day (BID) | ORAL | 0 refills | Status: AC
Start: 2020-02-14 — End: 2020-02-24

## 2020-02-14 NOTE — Progress Notes (Signed)
Patient ID: Daniel Garner, male    DOB: [birth year], 64 y.o.   MRN: [id number]  PCP: Steele Sizer, MD  Chief Complaint  Patient presents with  . Testicle Pain    Started 2 weeks ago    Subjective:   Daniel Garner is a 64 y.o. male, presents to clinic with CC of the following:  Chief Complaint  Patient presents with  . Testicle Pain    Started 2 weeks ago    HPI:  Patient is a 64 year old male patient of Dr. Ancil Boozer Presents today with 2-week history of testicle pain.  Patient notes the symptoms started about 2 weeks ago, with discomfort noted behind the testicles and more above the testicles bilateral, at times feeling it up into the groin area and the inner thigh regions.  He described it as an achy pain.  He states he has had this before a couple years ago, and did see a provider upstairs and was given a medicine to manage. He denies any dysuria, frequency, blood in the urine.  Also no discharge or rash.  He is currently married, only 1 partner, and no partner concerns and no STD concerns noted by the patient. He denies any fevers or chills.  Not feeling ill. He takes an over-the-counter male enhancer supplement off and on, and denies any pain with ejaculation or blood in the ejaculate. Denies much suprapubic pain.  No flank pains. He is status post bilateral inguinal hernia repairs, his last was approximately 6 to 8 years ago. He denies any swelling in the groin area.    Patient Active Problem List   Diagnosis Date Noted  . Osteoarthritis of right hip 05/27/2017  . Dyslipidemia 08/22/2016  . 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 Sleep apnea 08/16/2016  . Osteoarthritis 08/16/2016  . History of migraine 08/16/2016  . ED (erectile dysfunction) 08/16/2016  . GERD without esophagitis 08/16/2016  . 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 Prostatism 01/13/2015      Current Outpatient Medications:  .  aspirin EC 81 MG tablet, Take 1  tablet (81 mg total) by mouth daily., Disp: 30 tablet, Rfl: 0 .  Cholecalciferol (VITAMIN D-3) 1000 units CAPS, Take by mouth., Disp: , Rfl:  .  cyanocobalamin 1000 MCG tablet, Take 1,000 mcg by mouth daily., Disp: , Rfl:  .  ibuprofen (ADVIL,MOTRIN) 800 MG tablet, 800 mg, Disp: , Rfl:  .  vitamin C (ASCORBIC ACID) 500 MG tablet, Take 500 mg by mouth daily., Disp: , Rfl:  .  Vitamin E 100 units TABS, Take by mouth., Disp: , Rfl:    No Known Allergies   Past Surgical History:  Procedure Laterality Date  . COLONOSCOPY  2000  . COLONOSCOPY WITH PROPOFOL N/A 08/06/2015   Procedure: COLONOSCOPY WITH PROPOFOL;  Surgeon: Christene Lye, MD;  Location: [facility] ENDOSCOPY;  Service: Endoscopy;  Laterality: N/A;  . HERNIA REPAIR  [phone]  . TONSILLECTOMY       Family History  Problem Relation Age of Onset  . Arthritis Mother   . Colon cancer Father   . Prostate cancer Father   . Lung cancer Father      Social History   Tobacco Use  . Smoking status: Never Smoker  . Smokeless tobacco: Never Used  Substance Use Topics  . Alcohol use: No    Alcohol/week: 0.0 standard drinks    With staff assistance, above reviewed with the patient today.  ROS: As per HPI, otherwise  no specific complaints on a limited and focused system review   No results found for this or any previous visit (from the past 72 hour(s)).   PHQ2/9: Depression screen [facility] 2/9 02/14/2020 10/03/2019 09/10/2019 12/19/2018 08/16/2018  Decreased Interest 0 0 0 0 0  Down, Depressed, Hopeless 0 0 0 0 0  PHQ - 2 Score 0 0 0 0 0  Altered sleeping 0 0 0 0 -  Tired, decreased energy 0 0 0 0 -  Change in appetite 0 0 0 0 -  Feeling bad or failure about yourself  0 0 0 0 -  Trouble concentrating 0 0 0 0 -  Moving slowly or fidgety/restless 0 0 0 0 -  Suicidal thoughts 0 0 0 0 -  PHQ-9 Score 0 0 0 0 -  Difficult doing work/chores Not difficult at all - Not difficult at all Not difficult at all -   PHQ-2/9 Result is  neg  Fall Risk: Fall Risk  02/14/2020 10/03/2019 09/10/2019 12/19/2018 08/16/2018  Falls in the past year? 0 0 0 0 0  Number falls in past yr: 0 0 0 0 0  Injury with Fall? 0 0 0 0 0      Objective:   Vitals:   02/14/20 1055  BP: (!) 134/76  Pulse: 86  Resp: 16  Temp: 97.8 F (36.6 C)  TempSrc: Temporal  SpO2: 98%  Weight: 191 lb 6.4 oz (86.8 kg)  Height: 5\' 10"  (1.778 m)    Body mass index is 27.46 kg/m.  Physical Exam   NAD, masked, pleasant, looks well HEENT - [place]/AT, sclera anicteric, conjunctiva noninjected Abdomen-soft, nontender, nondistended, no masses palpated, No swelling or tenderness noted in the suprapubic region. GU-no rashes evident, pronounced varicosities evident surrounding the right testicle which were not tender to palpation, nontender palpating the left testicle, no discrete testicle nodule noted on exam, no urethral discharge, no inguinal adenopathy, no inguinal hernia on check bilateral with no gap identified and no pain when testing for inguinal hernia noted.  Nontender palpating the inguinal region and upper inner thighs where he sometimes feels the achiness radiate to. Ext - no LE edema,  Neuro/psychiatric - affect was not flat, appropriate with conversation  Alert with speech normal   Results for orders placed or performed in visit on 10/03/19  Lipid panel  Result Value Ref Range   Cholesterol 214 (H) <200 mg/dL   HDL 64 > OR = 40 mg/dL   Triglycerides 52 <150 mg/dL   LDL Cholesterol (Calc) 136 (H) mg/dL (calc)   Total CHOL/HDL Ratio 3.3 <5.0 (calc)   Non-HDL Cholesterol (Calc) 150 (H) <130 mg/dL (calc)  CBC with Differential/Platelet  Result Value Ref Range   WBC 6.0 3.8 - 10.8 Thousand/uL   RBC 4.57 4.20 - 5.80 Million/uL   Hemoglobin 14.2 13.2 - 17.1 g/dL   HCT 42.9 38 - 50 %   MCV 93.9 80.0 - 100.0 fL   MCH 31.1 27.0 - 33.0 pg   MCHC 33.1 32.0 - 36.0 g/dL   RDW 12.7 11.0 - 15.0 %   Platelets 268 140 - 400 Thousand/uL   MPV 9.7 7.5 -  12.5 fL   Neutro Abs 2,994 1,500 - 7,800 cells/uL   Lymphs Abs 2,376 850 - 3,900 cells/uL   Absolute Monocytes 516 200 - 950 cells/uL   Eosinophils Absolute 72 15 - 500 cells/uL   Basophils Absolute 42 0 - 200 cells/uL   Neutrophils Relative % 49.9 %   Total  Lymphocyte 39.6 %   Monocytes Relative 8.6 %   Eosinophils Relative 1.2 %   Basophils Relative 0.7 %  COMPLETE METABOLIC PANEL WITH GFR  Result Value Ref Range   Glucose, Bld 81 65 - 99 mg/dL   BUN 12 7 - 25 mg/dL   Creat 0.98 0.70 - 1.25 mg/dL   GFR, Est Non African American 83 > OR = 60 mL/min/1.19m2   GFR, Est African American 96 > OR = 60 mL/min/1.66m2   BUN/Creatinine Ratio NOT APPLICABLE 6 - 22 (calc)   Sodium 139 135 - 146 mmol/L   Potassium 4.6 3.5 - 5.3 mmol/L   Chloride 103 98 - 110 mmol/L   CO2 29 20 - 32 mmol/L   Calcium 10.0 8.6 - 10.3 mg/dL   Total Protein 7.0 6.1 - 8.1 g/dL   Albumin 4.6 3.6 - 5.1 g/dL   Globulin 2.4 1.9 - 3.7 g/dL (calc)   AG Ratio 1.9 1.0 - 2.5 (calc)   Total Bilirubin 0.7 0.2 - 1.2 mg/dL   Alkaline phosphatase (APISO) 79 35 - 144 U/L   AST 18 10 - 35 U/L   ALT 20 9 - 46 U/L  Hemoglobin A1c  Result Value Ref Range   Hgb A1c MFr Bld 5.5 <5.7 % of total Hgb   Mean Plasma Glucose 111 (calc)   eAG (mmol/L) 6.2 (calc)  PSA  Result Value Ref Range   PSA 2.4 < OR = 4.0 ng/mL  VITAMIN D 25 Hydroxy (Vit-D Deficiency, Fractures)  Result Value Ref Range   Vit D, 25-Hydroxy 24 (L) 30 - 100 ng/mL   Urine dip in the office today was entirely negative.    Assessment & Plan:   1. Epididymitis, bilateral Exact source of symptoms not entirely clear, but feel consistent with a low-grade epididymitis concern.  Not markedly tender on palpation today.  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  His symptoms are not right-sided, and nontender palpating this area today. Felt best to treat for a low-grade epididymitis, and  Bactrim DS 1 twice daily recommended for 10 days.  Preferred this over a quinolone presently due to the numerous risks with quinolones Can use an ibuprofen product as needed for discomfort in the short-term as well. Urine dip obtained was negative. Emphasized if symptoms not improving/resolving with the above management, he needs to follow-up, and may want to get an ultrasound to help further assess at that point with further plans pending reassessment at that time.  - sulfamethoxazole-trimethoprim (BACTRIM DS) 800-160 MG tablet; Take 1 tablet by mouth 2 (two) times daily for 10 days.  Dispense: 20 tablet; Refill: 0 - POCT Urinalysis Dipstick        Towanda Malkin, MD 02/14/20 11:35 AM

## 2020-02-26 ENCOUNTER — Encounter: Payer: Self-pay | Admitting: Internal Medicine

## 2020-02-26 ENCOUNTER — Other Ambulatory Visit: Payer: Self-pay

## 2020-02-26 ENCOUNTER — Ambulatory Visit: Payer: BC Managed Care – PPO | Admitting: Internal Medicine

## 2020-02-26 VITALS — BP 130/84 | HR 89 | Temp 98.1°F | Resp 16 | Ht 70.0 in | Wt 191.8 lb

## 2020-02-26 DIAGNOSIS — N451 Epididymitis: Secondary | ICD-10-CM

## 2020-02-26 DIAGNOSIS — N529 Male erectile dysfunction, unspecified: Secondary | ICD-10-CM

## 2020-02-26 MED ORDER — SILDENAFIL CITRATE 20 MG PO TABS
ORAL_TABLET | ORAL | 5 refills | Status: DC
Start: 2020-02-26 — End: 2021-06-09

## 2020-02-26 NOTE — Progress Notes (Signed)
Patient ID: HAIG GERARDO, male    DOB: Jul 14, 1956, 64 y.o.   MRN: [id number]  PCP: Steele Sizer, MD  Chief Complaint  Patient presents with  . Follow-up    f/u from testicle pain from 02/14/20    Subjective:   Daniel Garner is a 64 y.o. male, presents to clinic with CC of the following:  Chief Complaint  Patient presents with  . Follow-up    f/u from testicle pain from 02/14/20    HPI:  Patient is a 64 year old male patient of Dr. Ancil Boozer Last seen by me 02/14/2020 for epididymitis concern with that note reviewed Treated with a 10-day course of Bactrim DS. Follows up today.  He notes that 3 to 4 days after taking the Bactrim DS product his symptoms have in essence resolved.. A day or 2 later, he again started his male enhancement product, which is a maximum strength 1 and he has been using that for the past month, and his symptoms again started to recur. Looking back, it seems that his symptoms started soon after starting that initially. He stopped this product, and his symptoms got better again, and he has not returned to that product since last Thursday and his symptoms of stay resolved. It seems likely this may be the source of his symptoms. He did note without that product, he does struggle maintaining erections with sexual activity, and inquired about another product he could utilize. Initially noted he would prefer when he does not have to take right before planned activities.    Patient Active Problem List   Diagnosis Date Noted  . Osteoarthritis of right hip 05/27/2017  . Dyslipidemia 08/22/2016  . 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 Sleep apnea 08/16/2016  . Osteoarthritis 08/16/2016  . History of migraine 08/16/2016  . ED (erectile dysfunction) 08/16/2016  . GERD without esophagitis 08/16/2016  . 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 Prostatism 01/13/2015      Current Outpatient Medications:  .  aspirin EC 81 MG  tablet, Take 1 tablet (81 mg total) by mouth daily., Disp: 30 tablet, Rfl: 0 .  Cholecalciferol (VITAMIN D-3) 1000 units CAPS, Take by mouth., Disp: , Rfl:  .  cyanocobalamin 1000 MCG tablet, Take 1,000 mcg by mouth daily., Disp: , Rfl:  .  vitamin C (ASCORBIC ACID) 500 MG tablet, Take 500 mg by mouth daily., Disp: , Rfl:  .  Vitamin E 100 units TABS, Take by mouth., Disp: , Rfl:    No Known Allergies   Past Surgical History:  Procedure Laterality Date  . COLONOSCOPY  2000  . COLONOSCOPY WITH PROPOFOL N/A 08/06/2015   Procedure: COLONOSCOPY WITH PROPOFOL;  Surgeon: Christene Lye, MD;  Location: [facility] ENDOSCOPY;  Service: Endoscopy;  Laterality: N/A;  . HERNIA REPAIR  [phone]  . TONSILLECTOMY       Family History  Problem Relation Age of Onset  . Arthritis Mother   . Colon cancer Father   . Prostate cancer Father   . Lung cancer Father      Social History   Tobacco Use  . Smoking status: Never Smoker  . Smokeless tobacco: Never Used  Substance Use Topics  . Alcohol use: No    Alcohol/week: 0.0 standard drinks    With staff assistance, above reviewed with the patient today.  ROS: As per HPI, otherwise no specific complaints on a limited and focused system review   No results found for this or any  previous visit (from the past 72 hour(s)).   PHQ2/9: Depression screen [facility] 2/9 02/26/2020 02/14/2020 10/03/2019 09/10/2019 12/19/2018  Decreased Interest 0 0 0 0 0  Down, Depressed, Hopeless 0 0 0 0 0  PHQ - 2 Score 0 0 0 0 0  Altered sleeping 0 0 0 0 0  Tired, decreased energy 0 0 0 0 0  Change in appetite 0 0 0 0 0  Feeling bad or failure about yourself  0 0 0 0 0  Trouble concentrating 0 0 0 0 0  Moving slowly or fidgety/restless 0 0 0 0 0  Suicidal thoughts 0 0 0 0 0  PHQ-9 Score 0 0 0 0 0  Difficult doing work/chores Not difficult at all Not difficult at all - Not difficult at all Not difficult at all   PHQ-2/9 Result is neg  Fall Risk: Fall Risk  02/26/2020  02/14/2020 10/03/2019 09/10/2019 12/19/2018  Falls in the past year? 0 0 0 0 0  Number falls in past yr: 0 0 0 0 0  Injury with Fall? 0 0 0 0 0      Objective:   Vitals:   02/26/20 1508  BP: 130/84  Pulse: 89  Resp: 16  Temp: 98.1 F (36.7 C)  TempSrc: Temporal  SpO2: 100%  Weight: 191 lb 12.8 oz (87 kg)  Height: 5\' 10"  (1.778 m)    Body mass index is 27.52 kg/m.  Physical Exam   NAD, masked, pleasant, looks well HEENT - [place]/AT, sclera anicteric, Neuro/psychiatric - affect was not flat, appropriate with conversation             Alert with speech normal  Further exam not done today without concerning symptoms.  Results for orders placed or performed in visit on 02/14/20  POCT Urinalysis Dipstick  Result Value Ref Range   Color, UA Yellow    Clarity, UA Clear    Glucose, UA Negative Negative   Bilirubin, UA Negative    Ketones, UA Negative    Spec Grav, UA 1.010 1.010 - 1.025   Blood, UA Negative    pH, UA 8.0 5.0 - 8.0   Protein, UA Negative Negative   Urobilinogen, UA 0.2 0.2 or 1.0 E.U./dL   Nitrite, UA Negative    Leukocytes, UA Negative Negative   Appearance Normal    Odor Strong        Assessment & Plan:   1. Erectile dysfunction, unspecified erectile dysfunction type Patient has been using an maximum strength over-the-counter male enhancement product, and was helpful, although was causing some side effect concerns as above noted. Discussed options that are available to help, with the preferred entities wants to take prior to planned activities, and not to take every day. He does not have concerning BPH symptoms. Noted Cialis is a longer acting form of the sildenafil (which is the generic Viagra).  Noted the generic sildenafil is often more cost friendly and often reasonable to try initially.  Discussed the risk/benefits of the medicines and side effect concerns, and noted if do have side effects, the longer acting forms can cause longer side effect  concerns. Mutually agreed to try the generic sildenafil-20 mg initially 0.5 to 1-hour prior to planned activities as best, and if not having benefit, can try 2 tablets.  If 2 tablets not effective, can try up to 3 tablets and assess his response. Noted if this is expensive at his pharmacy, to inquire if other similar entities may be better covered with  his insurance, and let me know to help with that.  - sildenafil (REVATIO) 20 MG tablet; Take 1-3 tabs 0.5-2 hours prior to planned sexual activity  Dispense: 30 tablet; Refill: 5  2. Epididymitis, bilateral  Remains better since stopping his male enhancement product and may have been the likely source of his symptoms as we discussed. Will remain off of that product presently.     Towanda Malkin, MD 02/26/20 3:12 PM

## 2020-03-03 DIAGNOSIS — G4733 Obstructive sleep apnea (adult) (pediatric): Secondary | ICD-10-CM | POA: Diagnosis not present

## 2020-04-16 DIAGNOSIS — G4733 Obstructive sleep apnea (adult) (pediatric): Secondary | ICD-10-CM | POA: Diagnosis not present

## 2020-07-09 NOTE — Progress Notes (Signed)
Name: Daniel Garner   MRN: [id number]    DOB: 09-17-1955   Date:07/10/2020       Progress Note  Subjective  Chief Complaint  Abdominal pain   HPI  Right hip OA: seen by DR. Stanley, symptoms are getting worse, daily pain, worse when going up and down stairs and walking , aching like, radiates to right knee .  OSA: he is very compliant with CPAP machine, wakes up feeling refreshed no headaches. No changes    Umbilical hernia: seen by Dr. Shawna Orleans and given reassurance, he states no longer causing problems and wants to hold off on surgery . No change in bowel movements   Abdominal pain: he states about one month ago he noticed waking up with bloating sensation on his upper abdomen. He has a history of h.pylori, he states it feels kind of like that. Aggravated by laying flat so he has been using more pillows. No regurgitation or heartburn, no change in diet. Tums and gas X seems to improve symptoms slightly   Hypercholesterolemia:   The 10-year ASCVD risk score Mikey Bussing DC Jr., et al., 2013) is: 8.4%   Values used to calculate the score:     Age: 64 years     Sex: Male     Is Non-Hispanic African American: Yes     Diabetic: No     Tobacco smoker: No     Systolic Blood Pressure: 703 mmHg     Is BP treated: No     HDL Cholesterol: 64 mg/dL     Total Cholesterol: 214 mg/dL   Urinary frequency: he denies dysuria, he has a history of prostatitis and BPH , he has noticed incomplete void lately - going on for the past week No hematuria, fever, chills.   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   Sleep apnea 08/16/2016   Osteoarthritis 08/16/2016   History of migraine 08/16/2016   ED (erectile dysfunction) 08/16/2016   GERD without esophagitis 08/16/2016   BPH with obstruction/lower urinary tract symptoms 06/15/2015   Prostatism 01/13/2015    Past Surgical  History:  Procedure Laterality Date   COLONOSCOPY  2000   COLONOSCOPY WITH PROPOFOL N/A 08/06/2015   Procedure: COLONOSCOPY WITH PROPOFOL;  Surgeon: Christene Lye, MD;  Location: [facility] ENDOSCOPY;  Service: Endoscopy;  Laterality: N/A;   HERNIA REPAIR  2007,2012   TONSILLECTOMY      Family History  Problem Relation Age of Onset   Arthritis Mother    Colon cancer Father    Prostate cancer Father    Lung cancer Father     Social History   Tobacco Use   Smoking status: Never Smoker   Smokeless tobacco: Never Used  Substance Use Topics   Alcohol use: No    Alcohol/week: 0.0 standard drinks     Current Outpatient Medications:    aspirin EC 81 MG tablet, Take 1 tablet (81 mg total) by mouth daily., Disp: 30 tablet, Rfl: 0   Cholecalciferol (VITAMIN D-3) 1000 units CAPS, Take by mouth., Disp: , Rfl:    cyanocobalamin 1000 MCG tablet, Take 1,000 mcg by mouth daily., Disp: , Rfl:    sildenafil (REVATIO) 20 MG tablet, Take 1-3 tabs 0.5-2 hours prior to planned sexual activity, Disp: 30 tablet, Rfl: 5   vitamin C (ASCORBIC ACID) 500 MG tablet, Take 500 mg by mouth daily., Disp: , Rfl:  Vitamin E 100 units TABS, Take by mouth., Disp: , Rfl:   No Known Allergies  I personally reviewed active problem list, medication list, allergies, family history, social history, health maintenance with the patient/caregiver today.   ROS  Constitutional: Negative for fever , positive for mild weight change.  Respiratory: Negative for cough and shortness of breath.   Cardiovascular: Negative for chest pain or palpitations.  Gastrointestinal: positive  for abdominal pain,  But no  bowel changes.  Musculoskeletal: positive  for gait problem or joint swelling.  Skin: Negative for rash.  Neurological: Negative for dizziness or headache.  No other specific complaints in a complete review of systems (except as listed in HPI above).  Objective  Vitals:   07/10/20 0935  BP:  128/82  Pulse: 82  Resp: 18  Temp: 98.1 F (36.7 C)  TempSrc: Oral  SpO2: 94%  Weight: 198 lb 11.2 oz (90.1 kg)  Height: 5\' 10"  (1.778 m)    Body mass index is 28.51 kg/m.  Physical Exam  Constitutional: Patient appears well-developed and well-nourished. Overweight.  No distress.  HEENT: head atraumatic, normocephalic, pupils equal and reactive to light, neck supple Cardiovascular: Normal rate, regular rhythm and normal heart sounds.  No murmur heard. No BLE edema. Pulmonary/Chest: Effort normal and breath sounds normal. No respiratory distress. Abdominal: Soft.  There is no tenderness, could not feel the umbilical hernia , seems to have some diathesis recti  Psychiatric: Patient has a normal mood and affect. behavior is normal. Judgment and thought content normal.  Recent Results (from the past 2160 hour(s))  POCT Urinalysis Dip Manual     Status: Normal   Collection Time: 07/10/20  9:52 AM  Result Value Ref Range   Spec Grav, UA 1.010 1.010 - 1.025   pH, UA 7.0 5.0 - 8.0   Leukocytes, UA Negative Negative   Nitrite, UA Negative Negative   Poct Protein Negative Negative, trace mg/dL   Poct Glucose Normal Normal mg/dL   Poct Ketones Negative Negative   Poct Urobilinogen Normal Normal mg/dL   Poct Bilirubin Negative Negative   Poct Blood Negative Negative, trace      PHQ2/9: Depression screen [id number] 2/9 07/10/2020 02/26/2020 02/14/2020 10/03/2019 09/10/2019  Decreased Interest 0 0 0 0 0  Down, Depressed, Hopeless 0 0 0 0 0  PHQ - 2 Score 0 0 0 0 0  Altered sleeping - 0 0 0 0  Tired, decreased energy - 0 0 0 0  Change in appetite - 0 0 0 0  Feeling bad or failure about yourself  - 0 0 0 0  Trouble concentrating - 0 0 0 0  Moving slowly or fidgety/restless - 0 0 0 0  Suicidal thoughts - 0 0 0 0  PHQ-9 Score - 0 0 0 0  Difficult doing work/chores - Not difficult at all Not difficult at all - Not difficult at all    phq 9 is negative   Fall Risk: Fall Risk  07/10/2020  02/26/2020 02/14/2020 10/03/2019 09/10/2019  Falls in the past year? 0 0 0 0 0  Number falls in past yr: 0 0 0 0 0  Injury with Fall? 0 0 0 0 0     Functional Status Survey: Is the patient deaf or have difficulty hearing?: No Does the patient have difficulty seeing, even when wearing glasses/contacts?: No Does the patient have difficulty concentrating, remembering, or making decisions?: No Does the patient have difficulty walking or climbing stairs?: No Does the patient  have difficulty dressing or bathing?: No Does the patient have difficulty doing errands alone such as visiting a doctor's office or shopping?: No    Assessment & Plan  1. Epigastric pain   2. Need for immunization against influenza  - Flu Vaccine QUAD 36+ mos IM  3. BPH with obstruction/lower urinary tract symptoms   4. Dyspepsia  - H. pylori breath test - omeprazole (PRILOSEC) 40 MG capsule; Take 1 capsule (40 mg total) by mouth daily.  Dispense: 30 capsule; Refill: 1  5. Urinary frequency  - POCT Urinalysis Dip Manual - Urine Culture - tamsulosin (FLOMAX) 0.4 MG CAPS capsule; Take 1 capsule (0.4 mg total) by mouth daily.  Dispense: 30 capsule; Refill: 1 There are no diagnoses linked to this encounter.

## 2020-07-10 ENCOUNTER — Other Ambulatory Visit: Payer: Self-pay

## 2020-07-10 ENCOUNTER — Ambulatory Visit: Payer: BC Managed Care – PPO | Admitting: Family Medicine

## 2020-07-10 ENCOUNTER — Encounter: Payer: Self-pay | Admitting: Family Medicine

## 2020-07-10 VITALS — BP 128/82 | HR 82 | Temp 98.1°F | Resp 18 | Ht 70.0 in | Wt 198.7 lb

## 2020-07-10 DIAGNOSIS — R1013 Epigastric pain: Secondary | ICD-10-CM | POA: Diagnosis not present

## 2020-07-10 DIAGNOSIS — N401 Enlarged prostate with lower urinary tract symptoms: Secondary | ICD-10-CM

## 2020-07-10 DIAGNOSIS — Z23 Encounter for immunization: Secondary | ICD-10-CM | POA: Diagnosis not present

## 2020-07-10 DIAGNOSIS — R350 Frequency of micturition: Secondary | ICD-10-CM

## 2020-07-10 DIAGNOSIS — N138 Other obstructive and reflux uropathy: Secondary | ICD-10-CM

## 2020-07-10 LAB — POCT URINALYSIS DIPSTICK (MANUAL)
Leukocytes, UA: NEGATIVE
Nitrite, UA: NEGATIVE
Poct Bilirubin: NEGATIVE
Poct Blood: NEGATIVE
Poct Glucose: NORMAL mg/dL
Poct Ketones: NEGATIVE
Poct Protein: NEGATIVE mg/dL
Poct Urobilinogen: NORMAL mg/dL
Spec Grav, UA: 1.01 (ref 1.010–1.025)
pH, UA: 7 (ref 5.0–8.0)

## 2020-07-10 MED ORDER — OMEPRAZOLE 40 MG PO CPDR: 40.0000 mg | DELAYED_RELEASE_CAPSULE | Freq: Every day | ORAL | 1 refills | Status: DC

## 2020-07-10 MED ORDER — TAMSULOSIN HCL 0.4 MG PO CAPS
0.4000 mg | ORAL_CAPSULE | Freq: Every day | ORAL | 1 refills | Status: DC
Start: 2020-07-10 — End: 2021-02-06

## 2020-07-11 LAB — URINE CULTURE
MICRO NUMBER:: 11327803
Result:: NO GROWTH
SPECIMEN QUALITY:: ADEQUATE

## 2020-07-11 LAB — H. PYLORI BREATH TEST: H. pylori Breath Test: NOT DETECTED

## 2020-10-08 ENCOUNTER — Encounter: Payer: BC Managed Care – PPO | Admitting: Family Medicine

## 2020-10-13 DIAGNOSIS — G4733 Obstructive sleep apnea (adult) (pediatric): Secondary | ICD-10-CM | POA: Diagnosis not present

## 2021-02-06 ENCOUNTER — Ambulatory Visit: Payer: Self-pay | Admitting: Family Medicine

## 2021-02-06 ENCOUNTER — Encounter: Payer: Self-pay | Admitting: Family Medicine

## 2021-02-06 ENCOUNTER — Other Ambulatory Visit: Payer: Self-pay

## 2021-02-06 VITALS — BP 128/78 | HR 78 | Temp 98.9°F | Resp 16 | Ht 73.0 in | Wt 196.1 lb

## 2021-02-06 DIAGNOSIS — M25511 Pain in right shoulder: Secondary | ICD-10-CM

## 2021-02-06 DIAGNOSIS — K219 Gastro-esophageal reflux disease without esophagitis: Secondary | ICD-10-CM

## 2021-02-06 MED ORDER — PANTOPRAZOLE SODIUM 40 MG PO TBEC: 40.0000 mg | DELAYED_RELEASE_TABLET | Freq: Every day | ORAL | 0 refills | Status: DC

## 2021-02-06 MED ORDER — PANTOPRAZOLE SODIUM 40 MG PO TBEC: 40.0000 mg | DELAYED_RELEASE_TABLET | Freq: Every day | ORAL | 3 refills | Status: DC

## 2021-02-06 NOTE — Patient Instructions (Addendum)
Let us know if you need a referral to someone for your shoulder pain - Might want to consider seeing Dr. Zigmund Daniel sports medicine, please let us know  Try protonix for 2-4 weeks You can try pepcid or tums over the counter when needed    Food Choices for Gastroesophageal Reflux Disease, Adult When you have gastroesophageal reflux disease (GERD), the foods you eat and your eating habits are very important. Choosing the right foods can help ease your discomfort. Think about working with a food expert (dietitian) to help you make good choices. What are tips for following this plan? Reading food labels Look for foods that are low in saturated fat. Foods that may help with your symptoms include: Foods that have less than 5% of daily value (DV) of fat. Foods that have 0 grams of trans fat. Cooking Do not fry your food. Cook your food by baking, steaming, grilling, or broiling. These are all methods that do not need a lot of fat for cooking. To add flavor, try to use herbs that are low in spice and acidity. Meal planning  Choose healthy foods that are low in fat, such as: Fruits and vegetables. Whole grains. Low-fat dairy products. Lean meats, fish, and poultry. Eat small meals often instead of eating 3 large meals each day. Eat your meals slowly in a place where you are relaxed. Avoid bending over or lying down until 2-3 hours after eating. Limit high-fat foods such as fatty meats or fried foods. Limit your intake of fatty foods, such as oils, butter, and shortening. Avoid the following as told by your doctor: Foods that cause symptoms. These may be different for different people. Keep a food diary to keep track of foods that cause symptoms. Alcohol. Drinking a lot of liquid with meals. Eating meals during the 2-3 hours before bed.  Lifestyle Stay at a healthy weight. Ask your doctor what weight is healthy for you. If you need to lose weight, work with your doctor to do so  safely. Exercise for at least 30 minutes on 5 or more days each week, or as told by your doctor. Wear loose-fitting clothes. Do not smoke or use any products that contain nicotine or tobacco. If you need help quitting, ask your doctor. Sleep with the head of your bed higher than your feet. Use a wedge under the mattress or blocks under the bed frame to raise the head of the bed. Chew sugar-free gum after meals. What foods should eat?  Eat a healthy, well-balanced diet of fruits, vegetables, whole grains, low-fatdairy products, lean meats, fish, and poultry. Each person is different. Foods that may cause symptoms in one person may not cause any symptoms inanother person. Work with your doctor to find foods that are safe for you. The items listed above may not be a complete list of what you can eat and drink. Contact a food expert for more options. What foods should I avoid? Limiting some of these foods may help in managing the symptoms of GERD. Everyone is different. Talk with a food expert or your doctor to help you findthe exact foods to avoid, if any. Fruits Any fruits prepared with added fat. Any fruits that cause symptoms. For some people, this may include citrus fruits, such as oranges, grapefruit, pineapple,and lemons. Vegetables Deep-fried vegetables. [place] fries. Any vegetables prepared with added fat. Any vegetables that cause symptoms. For some people, this may include tomatoesand tomato products, chili peppers, onions and garlic, and horseradish. Grains Pastries or  quick breads with added fat. Meats and other proteins High-fat meats, such as fatty beef or pork, hot dogs, ribs, ham, sausage, salami, and bacon. Fried meat or protein, including fried fish and friedchicken. Nuts and nut butters, in large amounts. Dairy Whole milk and chocolate milk. Sour cream. Cream. Ice cream. Cream cheese.Milkshakes. Fats and oils Butter. Margarine. Shortening. Ghee. Beverages Coffee and tea,  with or without caffeine. Carbonated beverages. Sodas. Energy drinks. Fruit juice made with acidic fruits, such as orange or grapefruit.Tomato juice. Alcoholic drinks. Sweets and desserts Chocolate and cocoa. Donuts. Seasonings and condiments Pepper. Peppermint and spearmint. Added salt. Any condiments, herbs, or seasonings that cause symptoms. For some people, this may include curry, hotsauce, or vinegar-based salad dressings. The items listed above may not be a complete list of what you should not eat and drink. Contact a food expert for more options. Questions to ask your doctor Diet and lifestyle changes are often the first steps that are taken to manage symptoms of GERD. If diet and lifestyle changes do not help, talk with yourdoctor about taking medicines. Where to find more information [employer]: aboutgerd.org Summary When you have GERD, food and lifestyle choices are very important in easing your symptoms. Eat small meals often instead of 3 large meals a day. Eat your meals slowly and in a place where you are relaxed. Avoid bending over or lying down until 2-3 hours after eating. Limit high-fat foods such as fatty meats or fried foods. This information is not intended to replace advice given to you by your health care provider. Make sure you discuss any questions you have with your healthcare provider. Document Revised: 01/21/2020 Document Reviewed: 01/21/2020 Elsevier Patient Education  [place].

## 2021-02-06 NOTE — Progress Notes (Signed)
Patient ID: Daniel Garner, male    DOB: 27-Oct-1955, 65 y.o.   MRN: [id number]  PCP: Steele Sizer, MD  Chief Complaint  Patient presents with   Arm Pain    And shoulder messed up from lifting weights   Gastroesophageal Reflux    With chest discomfort improves with tums    Subjective:   Daniel Garner is a 65 y.o. male, presents to clinic with CC of the following:  HPI  Reflux sx, pain in upper abd/chest which gets better with tums He previously had neg h. Pylori test and omeprazole rx'd He also states he has a history of a hernia, he stopped taking omeprazole, pain is irritating, sometimes burning, worse at bedtime sometimes radiates to his back, does seem to worsen with stress, he can take Tums and it improves it significantly  Recent shoulder injury which occurred with lifting weights-complains of right shoulder and biceps pain onset about 3 weeks ago when lifting weights.  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   Sleep apnea 08/16/2016   Osteoarthritis 08/16/2016   History of migraine 08/16/2016   ED (erectile dysfunction) 08/16/2016   GERD without esophagitis 08/16/2016   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 (Patient not taking: Reported on 02/06/2021), Disp: 30 capsule, Rfl: 1   No Known Allergies   Social History   Tobacco Use   Smoking status: Never   Smokeless tobacco: Never  Vaping Use   Vaping Use: Never used  Substance Use Topics   Alcohol use: No    Alcohol/week: 0.0 standard drinks   Drug use: No      Chart Review Today: I personally reviewed active problem list, medication list, allergies, family history, social history, health maintenance, notes from last encounter, lab results, imaging with the patient/caregiver today.   Review of Systems  Constitutional: Negative.   HENT: Negative.    Eyes: Negative.   Respiratory: Negative.    Cardiovascular: Negative.   Gastrointestinal: Negative.   Endocrine: Negative.   Genitourinary: Negative.   Musculoskeletal: Negative.   Skin: Negative.   Allergic/Immunologic: Negative.   Neurological: Negative.   Hematological: Negative.   Psychiatric/Behavioral: Negative.    All other systems reviewed and are negative.     Objective:   Vitals:   02/06/21 1128  BP: 128/78  Pulse: 78  Resp: 16  Temp: 98.9 F (37.2 C)  SpO2: 98%  Weight: 196 lb 1.6 oz (89 kg)  Height: 6\' 1"  (1.854 m)    Body mass index is 25.87 kg/m.  Physical Exam Vitals and nursing note reviewed.  Constitutional:      General: He is not in acute distress.    Appearance: Normal appearance. He is well-developed. He is not ill-appearing, toxic-appearing or diaphoretic.     Interventions: Face mask in place.  HENT:     Head: Normocephalic and atraumatic.     Jaw: No trismus.     Right Ear: External ear normal.     Left Ear: External ear normal.  Eyes:      General: Lids are normal. No scleral icterus.       Right eye: No discharge.        Left eye: No discharge.     Conjunctiva/sclera: Conjunctivae normal.  Neck:     Trachea: Trachea and phonation normal. No tracheal deviation.  Cardiovascular:     Rate and Rhythm: Normal rate and regular rhythm.     Pulses: Normal pulses.          Radial pulses are 2+ on the right side and 2+ on the left side.       Posterior tibial pulses are 2+ on the right side and 2+ on the left side.     Heart sounds: Normal heart sounds. No murmur heard.   No friction rub. No gallop.  Pulmonary:     Effort: Pulmonary effort is normal. No respiratory distress.     Breath sounds: Normal breath sounds. No stridor. No wheezing, rhonchi or rales.  Abdominal:     General: Bowel sounds are normal. There is no distension.     Palpations: Abdomen is soft.     Tenderness: There is no abdominal tenderness. There is no right CVA tenderness, left CVA tenderness, guarding or rebound.     Hernia: No hernia is present.  Musculoskeletal:     Right shoulder: No swelling, deformity, effusion, tenderness, bony tenderness or crepitus. Decreased range of motion (normal flexion, extension, abduction and adduction, decreased internal rotation). Normal strength. Normal pulse.     Right lower leg: No edema.     Left lower leg: No edema.     Comments: Right shoulder negative drop test, positive Apley scratch test  Skin:    General: Skin is warm and dry.     Coloration: Skin is not jaundiced.     Findings: No rash.     Nails: There is no clubbing.  Neurological:     Mental Status: He is alert. Mental status is at baseline.     Cranial Nerves: No dysarthria or facial asymmetry.     Motor: No tremor or abnormal muscle tone.     Gait: Gait normal.  Psychiatric:        Attention and Perception: Attention normal.        Mood and Affect: Affect normal. Mood is anxious.        Speech: Speech normal.        Behavior: Behavior normal.  Behavior is cooperative.     Results for orders placed or performed in visit on 07/10/20  Urine Culture   Specimen: Urine  Result Value Ref Range   MICRO NUMBER: [id number]    SPECIMEN QUALITY: Adequate    Sample Source URINE    STATUS: FINAL    Result: No Growth   H. pylori breath test  Result Value  Ref Range   H. pylori Breath Test NOT DETECTED NOT DETECT  POCT Urinalysis Dip Manual  Result Value Ref Range   Spec Grav, UA 1.010 1.010 - 1.025   pH, UA 7.0 5.0 - 8.0   Leukocytes, UA Negative Negative   Nitrite, UA Negative Negative   Poct Protein Negative Negative, trace mg/dL   Poct Glucose Normal Normal mg/dL   Poct Ketones Negative Negative   Poct Urobilinogen Normal Normal mg/dL   Poct Bilirubin Negative Negative   Poct Blood Negative Negative, trace       Assessment & Plan:   1. GERD without esophagitis Recurrence of symptoms with increased stress, symptoms improved with Tums but are happening more frequently he previously was on omeprazole.  No abdominal pain today on exam, no unintentional weight loss, melena, hematochezia, no dysphagia.  Trial of stronger PPI for 2 to 4 weeks, he endorses a possible hiatal hernia, explained how this may cause some persistent symptoms and he may need to see GI specialist in the future, explained diet lifestyle changes and also explained to patient that he can continue to take Tums or Pepcid as needed especially when symptoms are more mild or intermittent - pantoprazole (PROTONIX) 40 MG tablet; Take 1 tablet (40 mg total) by mouth daily.  Dispense: 90 tablet; Refill: 0  2. Acute pain of right shoulder Injury with lifting weights which occurred a few weeks ago he has improved gradually since then No deformity, swelling, redness or tenderness on exam He has slight decrease in internal rotation with Apley scratch test positive otherwise great range of motion and normal sensation and strength Do not see any indication for any x-ray  imaging Offered physical therapy referral but he declined due to self-pay/no insurance Did explain to him that if he is not improving or if becomes worse we may be would to get him to see the sports medicine physician that is under primary care for further assessment or treatment  F/up as needed with PCP      Delsa Grana, PA-C 02/06/21 11:32 AM

## 2021-03-30 DIAGNOSIS — K5900 Constipation, unspecified: Secondary | ICD-10-CM | POA: Insufficient documentation

## 2021-03-30 DIAGNOSIS — M546 Pain in thoracic spine: Secondary | ICD-10-CM | POA: Insufficient documentation

## 2021-03-30 DIAGNOSIS — R1030 Lower abdominal pain, unspecified: Secondary | ICD-10-CM | POA: Insufficient documentation

## 2021-03-31 ENCOUNTER — Encounter: Payer: Self-pay | Admitting: Emergency Medicine

## 2021-03-31 ENCOUNTER — Other Ambulatory Visit: Payer: Self-pay

## 2021-03-31 ENCOUNTER — Emergency Department: Payer: Self-pay

## 2021-03-31 ENCOUNTER — Emergency Department
Admission: EM | Admit: 2021-03-31 | Discharge: 2021-03-31 | Disposition: A | Discharge status: Home / Self Care | Payer: Self-pay | Attending: Emergency Medicine | Admitting: Emergency Medicine

## 2021-03-31 DIAGNOSIS — R1030 Lower abdominal pain, unspecified: Secondary | ICD-10-CM

## 2021-03-31 DIAGNOSIS — K5900 Constipation, unspecified: Secondary | ICD-10-CM

## 2021-03-31 DIAGNOSIS — M549 Dorsalgia, unspecified: Secondary | ICD-10-CM

## 2021-03-31 LAB — COMPREHENSIVE METABOLIC PANEL
ALT: 26 U/L (ref 0–44)
AST: 23 U/L (ref 15–41)
Albumin: 4.7 g/dL (ref 3.5–5.0)
Alkaline Phosphatase: 83 U/L (ref 38–126)
Anion gap: 8 (ref 5–15)
BUN: 11 mg/dL (ref 8–23)
CO2: 26 mmol/L (ref 22–32)
Calcium: 9.4 mg/dL (ref 8.9–10.3)
Chloride: 100 mmol/L (ref 98–111)
Creatinine, Ser: 1.22 mg/dL (ref 0.61–1.24)
GFR, Estimated: >60 mL/min (ref >60)
Glucose, Bld: 117 mg/dL — ABNORMAL HIGH (ref 70–99)
Potassium: 3.8 mmol/L (ref 3.5–5.1)
Sodium: 134 mmol/L — ABNORMAL LOW (ref 135–145)
Total Bilirubin: 0.7 mg/dL (ref 0.3–1.2)
Total Protein: 7.4 g/dL (ref 6.5–8.1)

## 2021-03-31 LAB — URINALYSIS, ROUTINE W REFLEX MICROSCOPIC
Bilirubin Urine: NEGATIVE
Glucose, UA: NEGATIVE mg/dL
Hgb urine dipstick: NEGATIVE
Leukocytes,Ua: NEGATIVE
Nitrite: NEGATIVE
Protein, ur: NEGATIVE mg/dL
Specific Gravity, Urine: 1.025 (ref 1.005–1.030)
pH: 5 (ref 5.0–8.0)

## 2021-03-31 LAB — CBC WITH DIFFERENTIAL/PLATELET
Abs Immature Granulocytes: 0.01 10*3/uL (ref 0.00–0.07)
Basophils Absolute: 0 10*3/uL (ref 0.0–0.1)
Basophils Relative: 0 %
Eosinophils Absolute: 0.1 10*3/uL (ref 0.0–0.5)
Eosinophils Relative: 1 %
HCT: 43.1 % (ref 39.0–52.0)
Hemoglobin: 15 g/dL (ref 13.0–17.0)
Immature Granulocytes: 0 %
Lymphocytes Relative: 35 %
Lymphs Abs: 2.8 10*3/uL (ref 0.7–4.0)
MCH: 32.1 pg (ref 26.0–34.0)
MCHC: 34.8 g/dL (ref 30.0–36.0)
MCV: 92.1 fL (ref 80.0–100.0)
Monocytes Absolute: 0.6 10*3/uL (ref 0.1–1.0)
Monocytes Relative: 8 %
Neutro Abs: 4.5 10*3/uL (ref 1.7–7.7)
Neutrophils Relative %: 56 %
Platelets: 292 10*3/uL (ref 150–400)
RBC: 4.68 MIL/uL (ref 4.22–5.81)
RDW: 13.1 % (ref 11.5–15.5)
WBC: 8 10*3/uL (ref 4.0–10.5)
nRBC: 0 % (ref 0.0–0.2)

## 2021-03-31 LAB — LIPASE, BLOOD: Lipase: 42 U/L (ref 11–51)

## 2021-03-31 LAB — D-DIMER, QUANTITATIVE: D-Dimer, Quant: <0.27 ug/mL-FEU (ref 0.00–0.50)

## 2021-03-31 LAB — TROPONIN I (HIGH SENSITIVITY): Troponin I (High Sensitivity): 5 ng/L (ref <18)

## 2021-03-31 MED ORDER — LACTULOSE 10 GM/15ML PO SOLN: 20.0000 g | Freq: Every day | ORAL | 0 refills | Status: DC | PRN

## 2021-03-31 MED ORDER — IOHEXOL 350 MG/ML SOLN
80.0000 mL | Freq: Once | INTRAVENOUS | Status: AC | PRN
  Administered 2021-03-31: 80 mL via INTRAVENOUS

## 2021-03-31 MED ORDER — SODIUM CHLORIDE 0.9 % IV BOLUS
500.0000 mL | Freq: Once | INTRAVENOUS | Status: AC
  Administered 2021-03-31: 500 mL via INTRAVENOUS

## 2021-03-31 MED ORDER — IOHEXOL 9 MG/ML PO SOLN
500.0000 mL | ORAL | Status: AC
  Administered 2021-03-31 (×2): 500 mL via ORAL

## 2021-03-31 NOTE — Discharge Instructions (Addendum)
1.  You may take Lactulose as needed for bowel movements. 2.  Drink plenty of fluids daily. 3.  Return to the ER for worsening symptoms, persistent vomiting, difficulty breathing or other concerns.

## 2021-03-31 NOTE — ED Provider Notes (Signed)
[facility] Emergency Department Provider Note   ____________________________________________   None    (approximate)  I have reviewed the triage vital signs and the nursing notes.   HISTORY  Chief Complaint Constipation    HPI Daniel Garner is a 65 y.o. male who presents to the ED from home with a chief complaint of constipation, lower abdominal pain and upper back pain.  Patient reports constipation x1 week; had several BMs yesterday after taking OTC medications.  Reports upper back pain not exacerbated by deep breathing or movements.  Denies fall/trauma/injury.  Denies fever, cough, chest pain, shortness of breath, nausea, vomiting or urinary retention.     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 Authorizing Provider  lactulose (CHRONULAC) 10 GM/15ML solution Take 30 mLs (20 g total) by mouth daily as needed for mild constipation. 03/31/21  Yes Paulette Blanch, MD  Cholecalciferol (VITAMIN D-3) 1000 units CAPS Take by mouth.    [provider]  cyanocobalamin 1000 MCG tablet Take 1,000 mcg by mouth daily.    [provider]  pantoprazole (PROTONIX) 40 MG tablet Take 1 tablet (40 mg total) by mouth daily. 02/06/21   Delsa Grana, PA-C  pantoprazole (PROTONIX) 40 MG tablet Take 1 tablet (40 mg total) by mouth daily. 02/06/21   Delsa Grana, PA-C  sildenafil (REVATIO) 20 MG tablet Take 1-3 tabs 0.5-2 hours prior to planned sexual activity 02/26/20   Towanda Malkin, MD  vitamin C (ASCORBIC ACID) 500 MG tablet Take 500 mg by mouth daily.    [provider]  Vitamin E 100 units TABS Take by mouth.    [provider]    Allergies Patient has no known allergies.  Family History  Problem Relation Age of Onset   Arthritis Mother    Colon cancer Father    Prostate cancer Father    Lung cancer Father     Social History Social History   Tobacco Use   Smoking status: Never   Smokeless tobacco: Never  Vaping Use   Vaping Use: Never used  Substance Use Topics   Alcohol use: No    Alcohol/week: 0.0 standard drinks   Drug use: No    Review of Systems  Constitutional: No fever/chills Eyes: No visual changes. ENT: No sore throat. Cardiovascular: Denies chest pain. Respiratory: Denies shortness of breath. Gastrointestinal: Positive for abdominal pain.  No nausea, no vomiting.  No diarrhea.  No constipation. Genitourinary: Negative for dysuria. Musculoskeletal: Positive for back pain. Skin: Negative for rash. Neurological: Negative for  headaches, focal weakness or numbness.   ____________________________________________   PHYSICAL EXAM:  VITAL SIGNS: ED Triage Vitals  Enc Vitals Group     BP 03/31/21 0008 136/88     Pulse Rate 03/31/21 0008 72     Resp 03/31/21 0008 20     Temp 03/31/21 0008 98 F (36.7 C)     Temp Source 03/31/21 0008 Oral     SpO2 03/31/21 0008 98 %     Weight 03/31/21 0004 180 lb (81.6 kg)     Height 03/31/21 0004 '5\' 10"'$  (1.778 m)     Head  Circumference --      Peak Flow --      Pain Score 03/31/21 0004 2     Pain Loc --      Pain Edu? --      Excl. in [place]? --     Constitutional: Alert and oriented. Well appearing and in no acute distress. Eyes: Conjunctivae are normal. PERRL. EOMI. Head: Atraumatic. Nose: No congestion/rhinnorhea. Mouth/Throat: Mucous membranes are moist.   Neck: No stridor.   Cardiovascular: Normal rate, regular rhythm. Grossly normal heart sounds.  Good peripheral circulation. Respiratory: Normal respiratory effort.  No retractions. Lungs CTAB. Gastrointestinal: Soft and mildly tender to palpation lower abdomen without rebound or guarding. No distention. No abdominal bruits. No CVA tenderness. Musculoskeletal: No spinal tenderness to palpation.  Palpable thoracic back pain.  No lower extremity tenderness nor edema.  No joint effusions. Neurologic:  Normal speech and language. No gross focal neurologic deficits are appreciated. No gait instability. Skin:  Skin is warm, dry and intact. No rash noted. Psychiatric: Mood and affect are normal. Speech and behavior are normal.  ____________________________________________   LABS (all labs ordered are listed, but only abnormal results are displayed)  Labs Reviewed  URINALYSIS, ROUTINE W REFLEX MICROSCOPIC - Abnormal; Notable for the following components:      Result Value   Ketones, ur TRACE (*)    Bacteria, UA RARE (*)    All other components within normal limits  COMPREHENSIVE METABOLIC PANEL - Abnormal; Notable for the following components:   Sodium 134 (*)    Glucose, Bld 117 (*)    All other components within normal limits  CBC WITH DIFFERENTIAL/PLATELET  LIPASE, BLOOD  D-DIMER, QUANTITATIVE  TROPONIN I (HIGH SENSITIVITY)   ____________________________________________  EKG  ED ECG REPORT I, Derrick Tiegs J, the attending physician, personally viewed and interpreted this ECG.   Date: 03/31/2021  EKG Time: 0150  Rate: 75  Rhythm: normal  sinus rhythm  Axis: Normal  Intervals:none  ST&T Change: Nonspecific  ____________________________________________  RADIOLOGY I, Daiwik Buffalo J, personally viewed and evaluated these images (plain radiographs) as part of my medical decision making, as well as reviewing the written report by the radiologist.  ED MD interpretation: No acute intra-abdominal process; mild to moderate colonic stool burden  Official radiology report(s): CT Abdomen Pelvis W Contrast  Result Date: 03/31/2021 CLINICAL DATA:  Lower abdominal pain and constipation. EXAM: CT ABDOMEN AND PELVIS WITH CONTRAST TECHNIQUE: Multidetector CT imaging of the abdomen and pelvis was performed using the standard protocol following bolus administration of intravenous contrast. CONTRAST:  25m OMNIPAQUE IOHEXOL 350 MG/ML SOLN COMPARISON:  None. FINDINGS: Lower chest: No acute abnormality. Hepatobiliary: Multiple low-density lesions in the liver. The larger lesions are consistent with cysts, but many are too small to characterize. The gallbladder is unremarkable. No biliary dilatation. Pancreas: Unremarkable. No pancreatic ductal dilatation or surrounding inflammatory changes. Spleen: Normal in size without focal  abnormality. Adrenals/Urinary Tract: Adrenal glands are unremarkable. Subcentimeter low-density lesion in the left kidney is too small to characterize. Punctate right renal calculus. No hydronephrosis. The bladder is unremarkable. Stomach/Bowel: Stomach is within normal limits. Appendix appears normal. No evidence of bowel wall thickening, distention, or inflammatory changes. Mild-to-moderate colonic stool burden. Vascular/Lymphatic: Aortic atherosclerosis. No enlarged abdominal or pelvic lymph nodes. Reproductive: Mild prostatomegaly. Other: No abdominal wall hernia or abnormality. No abdominopelvic ascites. Musculoskeletal: Severe right and moderate left hip osteoarthritis. Small intramuscular lipoma in the right vastus lateralis muscle.  IMPRESSION: 1. No acute intra-abdominal process. 2. Mild-to-moderate colonic stool burden. 3. Punctate nonobstructive right nephrolithiasis. 4. Aortic Atherosclerosis (ICD10-I70.0). Electronically Signed   By: Titus Dubin M.D.   On: 03/31/2021 05:31   DG Abd Acute W/Chest  Result Date: 03/31/2021 CLINICAL DATA:  Constipation, lower abdominal pain EXAM: DG ABDOMEN ACUTE WITH 1 VIEW CHEST COMPARISON:  None. FINDINGS: Lungs are clear. No pneumothorax or pleural effusion. Cardiac size within normal limits. Pulmonary vascularity is normal. Normal abdominal gas pattern. Small stool within the descending colon. No free intraperitoneal gas. No organomegaly. Advanced degenerative arthritis within the right hip. Moderate degenerative arthritis within the left hip. IMPRESSION: No radiographic evidence of acute cardiopulmonary disease. Normal abdominal gas pattern.  Small stool. Bilateral degenerative hip arthritis, right greater than left. Electronically Signed   By: Fidela Salisbury M.D.   On: 03/31/2021 01:30    ____________________________________________   PROCEDURES  Procedure(s) performed (including Critical Care):  Procedures   ____________________________________________   INITIAL IMPRESSION / ASSESSMENT AND PLAN / ED COURSE  As part of my medical decision making, I reviewed the following data within the [place] notes reviewed and incorporated, Labs reviewed, EKG interpreted, Old chart reviewed, Radiograph reviewed, and Notes from prior ED visits     65 year old male presenting with lower abdominal and upper back pain; recent history of constipation. Differential diagnosis includes, but is not limited to, acute appendicitis, renal colic, testicular torsion, urinary tract infection/pyelonephritis, prostatitis,  epididymitis, diverticulitis, small bowel obstruction or ileus, colitis, abdominal aortic aneurysm, gastroenteritis, hernia, etc.   Laboratory results  unremarkable.  Will check D-dimer, troponin.  Abdominal series unremarkable.  Will obtain CT abdomen/pelvis.  Clinical Course as of 03/31/21 0629  Tue Mar 31, 2021  0441 D-dimer negative, repeat troponin unremarkable.  Awaiting results of CT scan.  Patient resting, visiting with spouse.  PVL noted.  Patient does not feel bladder discomfort or feel like he is retaining urine.  Since he is able to freely urinate, will hold off on Foley placement. [JS]  [id number] Updated patient and spouse on all test results.  Patient smiling, feels relieved and eager for discharge home.  Will discharge home with prescription for lactulose to use as needed.  Strict return precautions given.  Both verbalized understanding agree with plan of care [JS]    Clinical Course User Index [JS] Paulette Blanch, MD     ____________________________________________   FINAL CLINICAL IMPRESSION(S) / ED DIAGNOSES  Final diagnoses:  Constipation, unspecified constipation type  Lower abdominal pain  Upper back pain     ED Discharge Orders          Ordered    lactulose ([place]) 10 GM/15ML solution  Daily PRN        03/31/21 0543             Note:  This document was prepared using Dragon voice recognition software and may include unintentional dictation errors.    Beather Arbour,  Gretchen Short, MD 03/31/21 [phone]

## 2021-03-31 NOTE — ED Notes (Signed)
E-signature not working at this time. Pt verbalized understanding of D/C instructions, prescriptions and follow up care with no further questions at this time. Pt in NAD and ambulatory at time of D/C.  

## 2021-03-31 NOTE — ED Triage Notes (Signed)
Patient ambulatory to triage with steady gait, without difficulty or distress noted; pt reports constipation x 2wks (last BM Sunday); having some lower abd pain as well

## 2021-03-31 NOTE — ED Notes (Signed)
Post-void bladder scan revealed 351m.

## 2021-04-02 ENCOUNTER — Ambulatory Visit: Payer: Self-pay

## 2021-04-02 NOTE — Telephone Encounter (Signed)
Pt called in.   He hung up before agent got him connected to me.   The agent tried to call him right back while I waited on the line and pt did not answer.

## 2021-04-02 NOTE — Telephone Encounter (Signed)
Patient called, left VM to return the call to the office to discuss symptoms with a nurse.    Summary: Clinical Advice   patient was seen at [facility] on 03/31/2021 due to constipation and was advised to follow up with PCP by 04/03/2021 if symptoms did not improve, PCP has no available appointments and patient declined to schedule at this time sent a CRM to the practice for a possible work.  Patient seeking clinical advice since he was not able to pass a bowel since he was seen at the Emergency room. Patient states please leave a detail message because he is at work

## 2021-04-06 ENCOUNTER — Other Ambulatory Visit: Payer: Self-pay

## 2021-04-06 ENCOUNTER — Ambulatory Visit: Payer: Self-pay | Admitting: Family Medicine

## 2021-04-06 DIAGNOSIS — K5909 Other constipation: Secondary | ICD-10-CM

## 2021-04-06 DIAGNOSIS — K5900 Constipation, unspecified: Secondary | ICD-10-CM | POA: Insufficient documentation

## 2021-04-06 NOTE — Progress Notes (Signed)
   SUBJECTIVE:   CHIEF COMPLAINT / HPI:   ER FOLLOW UP Hospital/facility: [facility] 9/6 Diagnosis: constipation Procedures/tests:  Consultants:  - CT A/P - no acute process, mild-mod colonic stool burden, nonobstructive R kidney stone - Acute Abd series - small stool burden New medications: lactulose Discharge instructions:   Status: better - few weeks duration - has tried milk of magnesia and miralax with relief. Took lactulose 3-4 days ago, nothing since - drinking prune juice and coffee. - with abdominal and back pain, improving - last BM this morning.  - no blood in stool.  - normally type 4, now type 5 on BSC - small Bms, but becoming more regular.  - eating more fiber and drinking more water. Did have decrease in water intake a few weeks ago.    OBJECTIVE:   BP 114/72   Pulse 87   Temp 98.2 F (36.8 C)   Resp 16   Ht '6\' 1"'$  (1.854 m)   Wt 182 lb (82.6 kg)   SpO2 98%   BMI 24.01 kg/m   Gen: well appearing, in NAD Card: RRR Lungs: CTAB Abd: soft, NTND, no organomegaly, +BS Ext: WWP, no edema   ASSESSMENT/PLAN:   Constipation Improving with increased water and fiber intake, recommend continuation. No red flags on history or exam, imaging and labs in ED reassuring. Recommend daily miralax until stools normalize. Handout provided.     Myles Gip, DO

## 2021-04-06 NOTE — Assessment & Plan Note (Addendum)
Improving with increased water and fiber intake, recommend continuation. No red flags on history or exam, imaging and labs in ED reassuring. Recommend daily miralax until stools normalize. Handout provided.

## 2021-04-06 NOTE — Patient Instructions (Addendum)
It was great to see you!  Our plans for today:  - Keep drinking more water, aiming for 8 full glasses of water per day.  - continue to eat more fiber as you have been doing.  - You can take miralax daily until your bowel movements are regular.   Take care and seek immediate care sooner if you develop any concerns.   Dr. Wray Kearns Content in Foods Fiber is a substance that is found in plant foods, such as fruits, vegetables, whole grains, nuts, seeds, and beans. As part of your treatment and recovery plan, your health care provider may recommend that you eat foods that have specific amounts of dietary fiber. Some conditions may require a high-fiber diet while others may require a low-fiber diet. This sheet gives you information about the dietary fiber content of some common foods. Your health care provider will tell you how much fiber you need in your diet. If you have problems or questions, contact your health care provider or dietitian. What foods are high in fiber? Fruits Blackberries or raspberries (fresh) --  cup (75 g) has 4 g of fiber. Pear (fresh) -- 1 medium (180 g) has 5.5 g of fiber. Prunes (dried) -- 6 to 8 pieces (57-76 g) has 5 g of fiber. Apple with skin -- 1 medium (182 g) has 4.8 g of fiber. Guava -- 1 cup (128 g) has 8.9 g of fiber. Vegetables Peas (frozen) --  cup (80 g) has 4.4 g of fiber. Potato with skin (baked) -- 1 medium (173 g) has 4.4 g of fiber. Pumpkin (canned) --  cup (122 g) has 5 g of fiber. Brussels sprouts (cooked) --  cup (78 g) has 4 g of fiber. Sweet potato --  cup mashed (124 g) has 4 g of fiber. Winter squash -- 1 cup cooked (205 g) has 5.7 g of fiber. Grains Bran cereal --  cup (31 g) has 8.6 g of fiber. Bulgur (cooked) --  cup (70 g) has 4 g of fiber. Quinoa (cooked) -- 1 cup (185 g) has 5.2 g of fiber. Popcorn -- 3 cups (375 g) popped has 5.8 g of fiber. Spaghetti, whole wheat -- 1 cup (140 g) has 6 g of fiber. Meats and other  proteins Pinto beans (cooked) --  cup (90 g) has 7.7 g of fiber. Lentils (cooked) --  cup (90 g) has 7.8 g of fiber. Kidney beans (canned) --  cup (92.5 g) has 5.7 g of fiber. Soybeans (canned, frozen, or fresh) --  cup (92.5 g) has 5.2 g of fiber. Baked beans, plain or vegetarian (canned) --  cup (130 g) has 5.2 g of fiber. Garbanzo beans or chickpeas (canned) --  cup (90 g) has 6.6 g of fiber. Black beans (cooked) --  cup (86 g) has 7.5 g of fiber. White beans or navy beans (cooked) --  cup (91 g) has 9.3 g of fiber. The items listed above may not be a complete list of foods with high fiber. Actual amounts of fiber may be different depending on processing. Contact a dietitian for more information. What foods are moderate in fiber? Fruits Banana -- 1 medium (126 g) has 3.2 g of fiber. Melon -- 1 cup (155 g) has 1.4 g of fiber. Orange -- 1 small (154 g) has 3.7 g of fiber. Raisins --  cup (40 g) has 1.8 g of fiber. Applesauce, sweetened --  cup (125 g) has 1.5 g of fiber. Blueberries (fresh) --  cup (75 g) has 1.8 g of fiber. Strawberries (fresh, sliced) -- 1 cup ([id number] g) has 3 g of fiber. Cherries -- 1 cup (140 g) has 2.9 g of fiber. Vegetables Broccoli (cooked) --  cup (77.5 g) has 2.1 g of fiber. Carrots (cooked) --  cup (77.5 g) has 2.2 g of fiber. Corn (canned or frozen) --  cup (82.5 g) has 2.1 g of fiber. Potatoes, mashed --  cup (105 g) has 1.6 g of fiber. Tomato -- 1 medium (62 g) has 1.5 g of fiber. Green beans (canned) --  cup (83 g) has 2 g of fiber. Squash, winter --  cup (58 g) has 1 g of fiber. Sweet potato, baked -- 1 medium (150 g) has 3 g of fiber. Cauliflower (cooked) -- 1/2 cup (90 g) has 2.3 g of fiber. Grains Long-grain brown rice (cooked) -- 1 cup (196 g) has 3.5 g of fiber. Bagel, plain -- one 4-inch (10 cm) bagel has 2 g of fiber. Instant oatmeal --  cup (120 g) has about 2 g of fiber. Macaroni noodles, enriched (cooked) -- 1 cup (140 g) has  2.5 g of fiber. Multigrain cereal --  cup (15 g) has about 2-4 g of fiber. Whole-wheat bread -- 1 slice (26 g) has 2 g of fiber. Whole-wheat spaghetti noodles --  cup (70 g) has 3.2 g of fiber. Corn tortilla -- one 6-inch (15 cm) tortilla has 1.5 g of fiber. Meats and other proteins Almonds --  cup or 1 oz (28 g) has 3.5 g of fiber. Sunflower seeds in shell --  cup or  oz (11.5 g) has 1.1 g of fiber. Vegetable or soy patty -- 1 patty (70 g) has 3.4 g of fiber. Walnuts --  cup or 1 oz (30 g) has 2 g of fiber. Flax seed -- 1 Tbsp (7 g) has 2.8 g of fiber. The items listed above may not be a complete list of foods that have moderate amounts of fiber. Actual amounts of fiber may be different depending on processing. Contact a dietitian for more information. What foods are low in fiber? Low-fiber foods contain less than 1 g of fiber per serving. They include: Fruits Fruit juice --  cup or 4 fl oz (118 mL) has 0.5 g of fiber. Vegetables Lettuce -- 1 cup (35 g) has 0.5 g of fiber. Cucumber (slices) --  cup (60 g) has 0.3 g of fiber. Celery -- 1 stalk (40 g) has 0.1 g of fiber. Grains Flour tortilla -- one 6-inch (15 cm) tortilla has 0.5 g of fiber. White rice (cooked) --  cup (81.5 g) has 0.3 g of fiber. Meats and other proteins Egg -- 1 large (50 g) has 0 g of fiber. Meat, poultry, or fish -- 3 oz (85 g) has 0 g of fiber. Dairy Milk -- 1 cup or 8 fl oz (237 mL) has 0 g of fiber. Yogurt -- 1 cup (245 g) has 0 g of fiber. The items listed above may not be a complete list of foods that are low in fiber. Actual amounts of fiber may be different depending on processing. Contact a dietitian for more information. Summary Fiber is a substance that is found in plant foods, such as fruits, vegetables, whole grains, nuts, seeds, and beans. As part of your treatment and recovery plan, your health care provider may recommend that you eat foods that have specific amounts of dietary fiber. This  information is not intended to  replace advice given to you by your health care provider. Make sure you discuss any questions you have with your health care provider. Document Revised: 11/15/2019 Document Reviewed: 11/15/2019 Elsevier Patient Education  2022 [employer].

## 2021-04-20 ENCOUNTER — Ambulatory Visit: Payer: Self-pay

## 2021-04-20 ENCOUNTER — Telehealth: Payer: Self-pay

## 2021-04-20 NOTE — Telephone Encounter (Signed)
Pt has an appt with Dr Ancil Boozer on 04/22/21     Copied from [place] [phone]. Topic: Quick Communication - See Telephone Encounter >> Apr 20, 2021  9:24 AM Parke Poisson wrote: CRM for notification. Pt states he has not been able to sleep for 2 nights. He would like a call back ASAP

## 2021-04-20 NOTE — Telephone Encounter (Signed)
Pt reports insomnia x 2 weeks,occurring more frequently. States has slept for 1-2 hours last 2 nights. Reports falls to slepp easily, awakens afetr 1-2 hours and does not go back to sleep. Denies pain keeping him awake. States stressed several months ago looking for work , "Might still have something to do with it." Has taken 10mg  Melatonin, "Worked at first, no longer helping." Home care advise given. Assured pt NT would route to practice for PCPs review and any other recommendations until appt 9/28.  Also advised pharmacist as resource for OTC med. Pt verbalizes understanding.      Reason for Disposition  Insomnia is an ongoing problem (> 2 weeks)  Answer Assessment - Initial Assessment Questions 1. DESCRIPTION: "Tell me about your sleeping problem."      Can fall asleep, awakens after about 1 1/2 or 2 hours 2. ONSET: "How long have you been having trouble sleeping?" (e.g., days, weeks, months)     3 weeks ago, worsening 3. RECURRENT: "Have you had sleeping problems before?"  If Yes, ask: "What happened that time?" "What helped your sleeping problem go away in the past?"      In 1989, depressed.  4. STRESS: "Is there anything in your life that is making you feel stressed or tense?"     1 month ago, stressed. Out of work, constipation. Not as stressed presently 5. PAIN: "Do you have any pain that is keeping you awake?" (e.g., back pain, headache, abdominal pain)     no 6. CAFFEINE ABUSE: "Do you drink caffeinated beverages, and how much each day?" (e.g., coffee, tea, colas)     Coffee in mornings 2 cups. 7. ALCOHOL USE OR SUBSTANCE USE (DRUG USE): "Do you drink alcohol or use any illegal drugs?"     no 8. OTHER SYMPTOMS: "Do you have any other symptoms?"  (e.g., difficulty breathing)     no  Protocols used: Insomnia-A-AH

## 2021-04-20 NOTE — Telephone Encounter (Signed)
Pt stated he would like to call back for further advice. Pt also stated due to him not being able to sleep he missed work and may also need a work note.

## 2021-04-21 NOTE — Progress Notes (Signed)
Name: Daniel Garner   MRN: [id number]    DOB: 06-05-1956   Date:04/22/2021       Progress Note  Subjective  Chief Complaint  Constipation/ Insomnia  HPI  Constipation: he had a similar episode in the 90's. His last colonoscopy was in 2017. He states he changed his diet , eating more vegetables. He noticed a change in bowel movement since he got a new job ( stressful ) about 3 weeks ago, he used to have daily bowel movements [place] 4, now it is [place] scale 1 or very small in caliber, no blood in stools, no abdominal pain, he has also noticed some change in appetite, he went to [facility] and was given lactulose and it helped with symptoms, he is back on Miralax now   Dysthymia/Insomnia: he lost his job back in March, he could not get unemployment, he finally got a new job a few weeks ago but very stressful and is looking for something else. He is able to fall asleep but unable to stay asleep. He states melatonin initially helped with symptoms but no longer working . Discussed SSRI but he would like to hold off for now, would rather just get something for sleep    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   Prostatism 01/13/2015    Past Surgical History:  Procedure Laterality Date   COLONOSCOPY  2000   COLONOSCOPY WITH PROPOFOL N/A 08/06/2015   Procedure: COLONOSCOPY WITH PROPOFOL;  Surgeon: Christene Lye, MD;  Location: [facility] ENDOSCOPY;  Service: Endoscopy;  Laterality: N/A;   HERNIA REPAIR  2007,2012   TONSILLECTOMY      Family History  Problem Relation Age of Onset   Arthritis Mother    Colon cancer Father     Prostate cancer Father    Lung cancer Father     Social History   Tobacco Use   Smoking status: Never   Smokeless tobacco: Never  Substance Use Topics   Alcohol use: No    Alcohol/week: 0.0 standard drinks     Current Outpatient Medications:    Cholecalciferol (VITAMIN D-3) 1000 units CAPS, Take by mouth., Disp: , Rfl:    cyanocobalamin 1000 MCG tablet, Take 1,000 mcg by mouth daily., Disp: , Rfl:    sildenafil (REVATIO) 20 MG tablet, Take 1-3 tabs 0.5-2 hours prior to planned sexual activity, Disp: 30 tablet, Rfl: 5   vitamin C (ASCORBIC ACID) 500 MG tablet, Take 500 mg by mouth daily., Disp: , Rfl:    Vitamin E 100 units TABS, Take by mouth., Disp: , Rfl:   No Known Allergies  I personally reviewed active problem list, medication list, allergies, family history, social history with the patient/caregiver today.   ROS  Ten systems reviewed and is negative except as mentioned in HPI   Objective  Vitals:   04/22/21 1127  BP: 116/74  Pulse: 88  Resp: 16  Temp: 98.3 F (36.8 C)  SpO2: 98%  Weight: 180 lb (81.6 kg)  Height: 6\' 1"  (1.854 m)    Body mass index is 23.75 kg/m.  Physical Exam  Constitutional: Patient appears well-developed and well-nourished.  No distress.  HEENT: head atraumatic, normocephalic, pupils equal and reactive to light, neck supple Cardiovascular: Normal rate, regular rhythm and normal heart sounds.  No murmur heard. No BLE edema. Pulmonary/Chest: Effort normal and breath sounds normal. No respiratory distress. Abdominal: Soft.  There is no tenderness. Psychiatric: Patient has a normal mood and affect. behavior is normal. Judgment and thought content normal.    Recent Results (from the past 2160 hour(s))  Urinalysis, Routine w reflex microscopic     Status: Abnormal   Collection Time: 03/31/21 12:04 AM  Result Value Ref Range   Color, Urine YELLOW YELLOW   APPearance CLEAR CLEAR   Specific Gravity, Urine 1.025 1.005 - 1.030   pH  5.0 5.0 - 8.0   Glucose, UA NEGATIVE NEGATIVE mg/dL   Hgb urine dipstick NEGATIVE NEGATIVE   Bilirubin Urine NEGATIVE NEGATIVE   Ketones, ur TRACE (A) NEGATIVE mg/dL   Protein, ur NEGATIVE NEGATIVE mg/dL   Nitrite NEGATIVE NEGATIVE   Leukocytes,Ua NEGATIVE NEGATIVE   RBC / HPF 0-5 0 - 5 RBC/hpf   WBC, UA 0-5 0 - 5 WBC/hpf   Bacteria, UA RARE (A) NONE SEEN   Squamous Epithelial / LPF 0-5 0 - 5   Mucus PRESENT     Comment: Performed at [facility], [address] [place], [place] [address]  CBC with Differential     Status: None   Collection Time: 03/31/21 12:04 AM  Result Value Ref Range   WBC 8.0 4.0 - 10.5 K/uL   RBC 4.68 4.22 - 5.81 MIL/uL   Hemoglobin 15.0 13.0 - 17.0 g/dL   HCT 43.1 39.0 - 52.0 %   MCV 92.1 80.0 - 100.0 fL   MCH 32.1 26.0 - 34.0 pg   MCHC 34.8 30.0 - 36.0 g/dL   RDW 13.1 11.5 - 15.5 %   Platelets 292 150 - 400 K/uL   nRBC 0.0 0.0 - 0.2 %   Neutrophils Relative % 56 %   Neutro Abs 4.5 1.7 - 7.7 K/uL   Lymphocytes Relative 35 %   Lymphs Abs 2.8 0.7 - 4.0 K/uL   Monocytes Relative 8 %   Monocytes Absolute 0.6 0.1 - 1.0 K/uL   Eosinophils Relative 1 %   Eosinophils Absolute 0.1 0.0 - 0.5 K/uL   Basophils Relative 0 %   Basophils Absolute 0.0 0.0 - 0.1 K/uL   Immature Granulocytes 0 %   Abs Immature Granulocytes 0.01 0.00 - 0.07 K/uL    Comment: Performed at [facility], [address] [place], [place] [address]  Comprehensive metabolic panel     Status: Abnormal   Collection Time: 03/31/21 12:04 AM  Result Value Ref Range   Sodium 134 (L) 135 - 145 mmol/L   Potassium 3.8 3.5 - 5.1 mmol/L   Chloride 100 98 - 111 mmol/L   CO2 26 22 - 32 mmol/L   Glucose, Bld 117 (H) 70 - 99 mg/dL    Comment: Glucose reference range applies only to samples taken after fasting for at least 8 hours.   BUN 11 8 - 23 mg/dL   Creatinine, Ser 1.22 0.61 - 1.24 mg/dL   Calcium 9.4 8.9 - 10.3 mg/dL   Total Protein 7.4 6.5 - 8.1 g/dL   Albumin 4.7 3.5 -  5.0 g/dL   AST 23 15 - 41 U/L   ALT 26 0 - 44 U/L   Alkaline Phosphatase 83 38 - 126 U/L   Total Bilirubin 0.7 0.3 - 1.2 mg/dL   GFR,  Estimated >60 >60 mL/min    Comment: (NOTE) Calculated using the CKD-EPI Creatinine Equation (2021)    Anion gap 8 5 - 15    Comment: Performed at [facility], [address] [place], [place] [address]  Lipase, blood     Status: None   Collection Time: 03/31/21 12:04 AM  Result Value Ref Range   Lipase 42 11 - 51 U/L    Comment: Performed at [facility], [address] [place], [place] [address]  D-dimer, quantitative     Status: None   Collection Time: 03/31/21  2:57 AM  Result Value Ref Range   D-Dimer, Quant <0.27 0.00 - 0.50 ug/mL-FEU    Comment: (NOTE) At the manufacturer cut-off value of 0.5 g/mL FEU, this assay has a negative predictive value of 95-100%.This assay is intended for use in conjunction with a clinical pretest probability (PTP) assessment model to exclude pulmonary embolism (PE) and deep venous thrombosis (DVT) in outpatients suspected of PE or DVT. Results should be correlated with clinical presentation. Performed at [facility], [address], [place] [address]   Troponin I (High Sensitivity)     Status: None   Collection Time: 03/31/21  2:57 AM  Result Value Ref Range   Troponin I (High Sensitivity) 5 <18 ng/L    Comment: (NOTE) Elevated high sensitivity troponin I (hsTnI) values and significant  changes across serial measurements may suggest ACS but many other  chronic and acute conditions are known to elevate hsTnI results.  Refer to the "Links" section for chest pain algorithms and additional  guidance. Performed at [facility], [address] [place], [place] [address]     PHQ2/9: Depression screen [facility] 2/9 04/22/2021 04/06/2021 02/06/2021 07/10/2020 02/26/2020  Decreased Interest 1 1 1  0 0  Down, Depressed, Hopeless 2 1 1  0 0  PHQ - 2 Score 3 2 2  0 0   Altered sleeping 3 0 0 - 0  Tired, decreased energy 0 0 0 - 0  Change in appetite 2 2 0 - 0  Feeling bad or failure about yourself  1 1 0 - 0  Trouble concentrating 1 1 0 - 0  Moving slowly or fidgety/restless 0 0 0 - 0  Suicidal thoughts 0 0 0 - 0  PHQ-9 Score 10 6 2  - 0  Difficult doing work/chores - Somewhat difficult Not difficult at all - Not difficult at all  Some recent data might be hidden    phq 9 is positive   Fall Risk: Fall Risk  04/22/2021 04/06/2021 02/06/2021 07/10/2020 02/26/2020  Falls in the past year? 0 0 0 0 0  Number falls in past yr: 0 0 0 0 0  Injury with Fall? 0 0 0 0 0  Risk for fall due to : No Fall Risks - - - -  Follow up Falls prevention discussed - - - -      Functional Status Survey: Is the patient deaf or have difficulty hearing?: No Does the patient have difficulty seeing, even when wearing glasses/contacts?: Yes Does the patient have difficulty concentrating, remembering, or making decisions?: No Does the patient have difficulty walking or climbing stairs?: No Does the patient have difficulty dressing or bathing?: No Does the patient have difficulty doing errands alone such as visiting a doctor's office or shopping?: No    Assessment & Plan  1. Acute constipation  - Ambulatory referral to Gastroenterology  2. Colon cancer screening  - Ambulatory referral to  Gastroenterology  3. Dysthymia   4. Psychophysiological insomnia  - traZODone (DESYREL) 50 MG tablet; Take 0.5-2 tablets (25-100 mg total) by mouth at bedtime as needed for sleep.  Dispense: 60 tablet; Refill: 0

## 2021-04-21 NOTE — Telephone Encounter (Signed)
Spoke with patient, he stated with melatonin suggested from pharmacy he seemed to have slept better last night. He is still concerned about this and will elaborate more tomorrow at his in office visit.

## 2021-04-21 NOTE — Telephone Encounter (Signed)
Pt has an appt for tomorrow with Dr Ancil Boozer but still would like a call for advise.

## 2021-04-22 ENCOUNTER — Other Ambulatory Visit: Payer: Self-pay

## 2021-04-22 ENCOUNTER — Ambulatory Visit: Payer: Self-pay | Admitting: Family Medicine

## 2021-04-22 ENCOUNTER — Encounter: Payer: Self-pay | Admitting: Family Medicine

## 2021-04-22 VITALS — BP 116/74 | HR 88 | Temp 98.3°F | Resp 16 | Ht 73.0 in | Wt 180.0 lb

## 2021-04-22 DIAGNOSIS — F5104 Psychophysiologic insomnia: Secondary | ICD-10-CM

## 2021-04-22 DIAGNOSIS — K5900 Constipation, unspecified: Secondary | ICD-10-CM

## 2021-04-22 DIAGNOSIS — Z1211 Encounter for screening for malignant neoplasm of colon: Secondary | ICD-10-CM

## 2021-04-22 DIAGNOSIS — F341 Dysthymic disorder: Secondary | ICD-10-CM

## 2021-04-22 MED ORDER — TRAZODONE HCL 50 MG PO TABS: 25.0000 mg | ORAL_TABLET | Freq: Every evening | ORAL | 0 refills | Status: DC | PRN

## 2021-04-23 ENCOUNTER — Encounter: Payer: Self-pay | Admitting: *Deleted

## 2021-04-30 ENCOUNTER — Ambulatory Visit: Payer: Self-pay | Admitting: *Deleted

## 2021-04-30 NOTE — Telephone Encounter (Signed)
Pt requests to speak with a nurse to discuss insomnia. Pt stated he has not been able to sleep. Cb# [phone]

## 2021-04-30 NOTE — Telephone Encounter (Signed)
Pt. States PCP put him on Trazodone for insomnia. States it has not helped. Has tried titrating up to 3 pills. 3 pills caused increased heart rate. With the medication only sleeping 2 hours at a time. 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 Asking if he needs to be seen or can another medication be tried. Please advise.    Answer Assessment - Initial Assessment Questions 1. DESCRIPTION: "Tell me about your sleeping problem."      Only sleeping 2 hours straight 2. ONSET: "How long have you been having trouble sleeping?" (e.g., days, weeks, months)     3 weeks ago 3. RECURRENT: "Have you had sleeping problems before?"  If Yes, ask: "What happened that time?" "What helped your sleeping problem go away in the past?"      Yes 4. STRESS: "Is there anything in your life that is making you feel stressed or tense?"     Stress - had been out of work 5. PAIN: "Do you have any pain that is keeping you awake?" (e.g., back pain, headache, abdominal pain)     Constipation 6. CAFFEINE ABUSE: "Do you drink caffeinated beverages, and how much each day?" (e.g., coffee, tea, colas)     Coffee in morning only 7. ALCOHOL USE OR SUBSTANCE USE (DRUG USE): "Do you drink alcohol or use any illegal drugs?"     No 8. OTHER SYMPTOMS: "Do you have any other symptoms?"  (e.g., difficulty breathing)     Aching in left testicle.  Protocols used: Insomnia-A-AH

## 2021-05-05 ENCOUNTER — Other Ambulatory Visit: Payer: Self-pay | Admitting: Family Medicine

## 2021-05-05 ENCOUNTER — Telehealth: Payer: Self-pay

## 2021-05-05 DIAGNOSIS — G4709 Other insomnia: Secondary | ICD-10-CM

## 2021-05-05 MED ORDER — TEMAZEPAM 15 MG PO CAPS: 15.0000 mg | ORAL_CAPSULE | Freq: Every evening | ORAL | 0 refills | Status: DC | PRN

## 2021-05-05 NOTE — Telephone Encounter (Signed)
Reached out to pt but no answer, left a vm to call back.

## 2021-05-05 NOTE — Telephone Encounter (Signed)
Copied from [place] [id number]. Topic: General - Other >> May 05, 2021  9:45 AM Tessa Lerner A wrote: Reason for CRM: The patient has returned a missed call from the practice  Please contact again when possible

## 2021-05-05 NOTE — Telephone Encounter (Signed)
Spoke to pt, pt stated he was concern for the side effects that the new Rx have. I did let him know its not guaranteed all patients get the side effects. Pt will give it a try and let us know how it worked out.

## 2021-05-05 NOTE — Telephone Encounter (Signed)
Spoke to pt already.

## 2021-05-12 ENCOUNTER — Telehealth: Payer: Self-pay

## 2021-05-12 ENCOUNTER — Other Ambulatory Visit: Payer: Self-pay | Admitting: Family Medicine

## 2021-05-12 NOTE — Telephone Encounter (Signed)
Copied from [place] [id number]. Topic: General - Inquiry >> May 12, 2021  1:47 PM Greggory Keen D wrote: Reason for CRM: Pt called saying he is still having problems sleeping even with the medication that was prescribed.  CB#  [phone]

## 2021-05-13 ENCOUNTER — Ambulatory Visit: Payer: Self-pay | Admitting: *Deleted

## 2021-05-13 ENCOUNTER — Emergency Department
Admission: EM | Admit: 2021-05-13 | Discharge: 2021-05-14 | Disposition: A | Discharge status: Home / Self Care | Payer: Self-pay | Attending: Emergency Medicine | Admitting: Emergency Medicine

## 2021-05-13 ENCOUNTER — Other Ambulatory Visit: Payer: Self-pay

## 2021-05-13 DIAGNOSIS — N200 Calculus of kidney: Secondary | ICD-10-CM | POA: Insufficient documentation

## 2021-05-13 DIAGNOSIS — R1084 Generalized abdominal pain: Secondary | ICD-10-CM

## 2021-05-13 DIAGNOSIS — K4091 Unilateral inguinal hernia, without obstruction or gangrene, recurrent: Secondary | ICD-10-CM | POA: Insufficient documentation

## 2021-05-13 DIAGNOSIS — K219 Gastro-esophageal reflux disease without esophagitis: Secondary | ICD-10-CM | POA: Insufficient documentation

## 2021-05-13 LAB — CBC
HCT: 45.9 % (ref 39.0–52.0)
Hemoglobin: 15.5 g/dL (ref 13.0–17.0)
MCH: 31.7 pg (ref 26.0–34.0)
MCHC: 33.8 g/dL (ref 30.0–36.0)
MCV: 93.9 fL (ref 80.0–100.0)
Platelets: 338 10*3/uL (ref 150–400)
RBC: 4.89 MIL/uL (ref 4.22–5.81)
RDW: 13.3 % (ref 11.5–15.5)
WBC: 8.9 10*3/uL (ref 4.0–10.5)
nRBC: 0 % (ref 0.0–0.2)

## 2021-05-13 LAB — COMPREHENSIVE METABOLIC PANEL
ALT: 22 U/L (ref 0–44)
AST: 18 U/L (ref 15–41)
Albumin: 4.8 g/dL (ref 3.5–5.0)
Alkaline Phosphatase: 76 U/L (ref 38–126)
Anion gap: 6 (ref 5–15)
BUN: 11 mg/dL (ref 8–23)
CO2: 29 mmol/L (ref 22–32)
Calcium: 9.9 mg/dL (ref 8.9–10.3)
Chloride: 101 mmol/L (ref 98–111)
Creatinine, Ser: 0.99 mg/dL (ref 0.61–1.24)
GFR, Estimated: >60 mL/min (ref >60)
Glucose, Bld: 99 mg/dL (ref 70–99)
Potassium: 4.1 mmol/L (ref 3.5–5.1)
Sodium: 136 mmol/L (ref 135–145)
Total Bilirubin: 0.8 mg/dL (ref 0.3–1.2)
Total Protein: 8 g/dL (ref 6.5–8.1)

## 2021-05-13 LAB — URINALYSIS, ROUTINE W REFLEX MICROSCOPIC
Bacteria, UA: NONE SEEN
Bilirubin Urine: NEGATIVE
Glucose, UA: NEGATIVE mg/dL
Ketones, ur: NEGATIVE mg/dL
Leukocytes,Ua: NEGATIVE
Nitrite: NEGATIVE
Protein, ur: NEGATIVE mg/dL
Specific Gravity, Urine: 1.018 (ref 1.005–1.030)
pH: 5 (ref 5.0–8.0)

## 2021-05-13 NOTE — ED Triage Notes (Signed)
Pt complains of periumbilicar pain that radiates to rlq pain. Pt states began 1130 today. Pt denies fever, vomiting, nausea.

## 2021-05-13 NOTE — Telephone Encounter (Signed)
Patient calls with lower abdomen pain (more of a cramping)slightly to the right. This is the second or third episode within 2 weeks. Comes and goes not specific to eating. LBM today was large after taking Miralax. Had patient take 2 Tums during this call which he now reports the cramping and burning sensation has decreased. No diarrhea/N/V/fever/CP/SOB. No sour taste in mouth and no difficulty voiding. Patient is very limited on time and can only do 8:00 or 8:30am appointments. Reviewed urgent symptoms he would need to seek immediate care for if noticed. Patient will increase his daily water intake, no greasy foods, avoid alcohol/caffeine. Requesting office to phone patient between 8-9am Thursday morning with any available early morning appointment for Friday. Scheduled appointment for Tuesday.     Reason for Disposition  [1] MODERATE pain (e.g., interferes with normal activities) AND [2] pain comes and goes (cramps) AND [3] present > 24 hours  (Exception: pain with Vomiting or Diarrhea - see that Guideline)  Answer Assessment - Initial Assessment Questions 1. LOCATION: "Where does it hurt?"      Below umbilicus middle and to the right.  2. RADIATION: "Does the pain shoot anywhere else?" (e.g., chest, back)     no 3. ONSET: "When did the pain begin?" (Minutes, hours or days ago)      One to two weeks ago comes and goes 4. SUDDEN: "Gradual or sudden onset?"     gradual 5. PATTERN "Does the pain come and go, or is it constant?"    - If constant: "Is it getting better, staying the same, or worsening?"      (Note: Constant means the pain never goes away completely; most serious pain is constant and it progresses)     - If intermittent: "How long does it last?" "Do you have pain now?"     (Note: Intermittent means the pain goes away completely between bouts)     Comes and goes  6. SEVERITY: "How bad is the pain?"  (e.g., Scale 1-10; mild, moderate, or severe)    - MILD (1-3): doesn't interfere  with normal activities, abdomen soft and not tender to touch     - MODERATE (4-7): interferes with normal activities or awakens from sleep, abdomen tender to touch     - SEVERE (8-10): excruciating pain, doubled over, unable to do any normal activities       7-8 7. RECURRENT SYMPTOM: "Have you ever had this type of stomach pain before?" If Yes, ask: "When was the last time?" and "What happened that time?"      Not really 8. CAUSE: "What do you think is causing the stomach pain?"     unsure 9. RELIEVING/AGGRAVATING FACTORS: "What makes it better or worse?" (e.g., movement, antacids, bowel movement)     Tums helped once 10. OTHER SYMPTOMS: "Do you have any other symptoms?" (e.g., back pain, diarrhea, fever, urination pain, vomiting)       no  Protocols used: Abdominal Pain - Male-A-AH

## 2021-05-13 NOTE — Telephone Encounter (Signed)
Pt states he will try taking 2 pills at night.

## 2021-05-14 ENCOUNTER — Other Ambulatory Visit: Payer: Self-pay

## 2021-05-14 ENCOUNTER — Encounter: Payer: Self-pay | Admitting: Nurse Practitioner

## 2021-05-14 ENCOUNTER — Encounter: Payer: Self-pay | Admitting: Radiology

## 2021-05-14 ENCOUNTER — Telehealth: Payer: Self-pay | Admitting: Family Medicine

## 2021-05-14 ENCOUNTER — Ambulatory Visit (INDEPENDENT_AMBULATORY_CARE_PROVIDER_SITE_OTHER): Payer: Self-pay | Admitting: Nurse Practitioner

## 2021-05-14 ENCOUNTER — Emergency Department: Payer: Self-pay

## 2021-05-14 VITALS — BP 124/80 | HR 97 | Temp 98.3°F | Resp 18 | Ht 73.0 in | Wt 175.2 lb

## 2021-05-14 DIAGNOSIS — G4709 Other insomnia: Secondary | ICD-10-CM

## 2021-05-14 DIAGNOSIS — K4090 Unilateral inguinal hernia, without obstruction or gangrene, not specified as recurrent: Secondary | ICD-10-CM

## 2021-05-14 DIAGNOSIS — F331 Major depressive disorder, recurrent, moderate: Secondary | ICD-10-CM

## 2021-05-14 DIAGNOSIS — Z23 Encounter for immunization: Secondary | ICD-10-CM

## 2021-05-14 MED ORDER — TRAZODONE HCL 50 MG PO TABS: 50.0000 mg | ORAL_TABLET | Freq: Every day | ORAL | 1 refills | Status: DC

## 2021-05-14 MED ORDER — IOHEXOL 300 MG/ML  SOLN
100.0000 mL | Freq: Once | INTRAMUSCULAR | Status: AC | PRN
  Administered 2021-05-14: 100 mL via INTRAVENOUS
  Filled 2021-05-14: qty 100

## 2021-05-14 NOTE — Progress Notes (Signed)
BP 124/80   Pulse 97   Temp 98.3 F (36.8 C) (Oral)   Resp 18   Ht 6\' 1"  (1.854 m)   Wt 175 lb 3.2 oz (79.5 kg)   SpO2 98%   BMI 23.11 kg/m    Subjective:    Patient ID: Daniel Garner, male    DOB: Dec 05, 1955, 65 y.o.   MRN: [id number]  HPI: Daniel Garner is a 65 y.o. male, here alone  Chief Complaint  Patient presents with   Referral    hernia   Insomnia   Depression and Insomnia: He says he has been really concerned about his health and just stressing about everything. His PHQ9 is 13 today.  He says he is having trouble falling asleep for about a month. He says he was taking temazepam but it was not really working for him.  He also tried melatonin with really no success.  Will try trazodone and will follow-up in a month.   Hernia: He was seen in the er yesterday for lower abdominal pain.  He had a ct scan of his abdomen noted right inguinal hernia slightly increased in size with a small amount of fluid within.  The er had already placed referral to surgery.  Discussed this with patient and gave him the information for his coming up appointment.  His appointment is with Dr.  Hampton Abbot on 05/22/21. He says his abdominal pain is a little better.    Depression screen [facility] 2/9 05/14/2021 04/22/2021 04/06/2021 02/06/2021 07/10/2020  Decreased Interest 2 1 1 1  0  Down, Depressed, Hopeless 2 2 1 1  0  PHQ - 2 Score 4 3 2 2  0  Altered sleeping 3 3 0 0 -  Tired, decreased energy 2 0 0 0 -  Change in appetite 2 2 2  0 -  Feeling bad or failure about yourself  1 1 1  0 -  Trouble concentrating 1 1 1  0 -  Moving slowly or fidgety/restless 0 0 0 0 -  Suicidal thoughts 0 0 0 0 -  PHQ-9 Score 13 10 6 2  -  Difficult doing work/chores Somewhat difficult - Somewhat difficult Not difficult at all -  Some recent data might be hidden    Relevant past medical, surgical, family and social history reviewed and updated as indicated. Interim medical history since our last visit reviewed. Allergies  and medications reviewed and updated.  Review of Systems  Constitutional: Negative for fever or weight change.  Respiratory: Negative for cough and shortness of breath.   Cardiovascular: Negative for chest pain or palpitations.  Gastrointestinal: Positive for abdominal pain, no bowel changes.  Musculoskeletal: Negative for gait problem or joint swelling.  Skin: Negative for rash.  Neurological: Negative for dizziness or headache.  No other specific complaints in a complete review of systems (except as listed in HPI above).      Objective:    BP 124/80   Pulse 97   Temp 98.3 F (36.8 C) (Oral)   Resp 18   Ht 6\' 1"  (1.854 m)   Wt 175 lb 3.2 oz (79.5 kg)   SpO2 98%   BMI 23.11 kg/m   Wt Readings from Last 3 Encounters:  05/14/21 175 lb 3.2 oz (79.5 kg)  05/13/21 180 lb (81.6 kg)  04/22/21 180 lb (81.6 kg)    Physical Exam  Constitutional: Patient appears well-developed and well-nourished, No distress.  HEENT: head atraumatic, normocephalic, pupils equal and reactive to light, neck supple, throat within normal  limits Cardiovascular: Normal rate, regular rhythm and normal heart sounds.  No murmur heard. No BLE edema. Pulmonary/Chest: Effort normal and breath sounds normal. No respiratory distress. Abdominal: Soft.  RLQ tenderness Psychiatric: Patient has a depressed mood and affect. behavior is normal. Judgment and thought content normal.   Results for orders placed or performed during the hospital encounter of 05/13/21  Comprehensive metabolic panel  Result Value Ref Range   Sodium 136 135 - 145 mmol/L   Potassium 4.1 3.5 - 5.1 mmol/L   Chloride 101 98 - 111 mmol/L   CO2 29 22 - 32 mmol/L   Glucose, Bld 99 70 - 99 mg/dL   BUN 11 8 - 23 mg/dL   Creatinine, Ser 0.99 0.61 - 1.24 mg/dL   Calcium 9.9 8.9 - 10.3 mg/dL   Total Protein 8.0 6.5 - 8.1 g/dL   Albumin 4.8 3.5 - 5.0 g/dL   AST 18 15 - 41 U/L   ALT 22 0 - 44 U/L   Alkaline Phosphatase 76 38 - 126 U/L   Total  Bilirubin 0.8 0.3 - 1.2 mg/dL   GFR, Estimated >60 >60 mL/min   Anion gap 6 5 - 15  CBC  Result Value Ref Range   WBC 8.9 4.0 - 10.5 K/uL   RBC 4.89 4.22 - 5.81 MIL/uL   Hemoglobin 15.5 13.0 - 17.0 g/dL   HCT 45.9 39.0 - 52.0 %   MCV 93.9 80.0 - 100.0 fL   MCH 31.7 26.0 - 34.0 pg   MCHC 33.8 30.0 - 36.0 g/dL   RDW 13.3 11.5 - 15.5 %   Platelets 338 150 - 400 K/uL   nRBC 0.0 0.0 - 0.2 %  Urinalysis, Routine w reflex microscopic Urine, Clean Catch  Result Value Ref Range   Color, Urine YELLOW (A) YELLOW   APPearance CLEAR (A) CLEAR   Specific Gravity, Urine 1.018 1.005 - 1.030   pH 5.0 5.0 - 8.0   Glucose, UA NEGATIVE NEGATIVE mg/dL   Hgb urine dipstick SMALL (A) NEGATIVE   Bilirubin Urine NEGATIVE NEGATIVE   Ketones, ur NEGATIVE NEGATIVE mg/dL   Protein, ur NEGATIVE NEGATIVE mg/dL   Nitrite NEGATIVE NEGATIVE   Leukocytes,Ua NEGATIVE NEGATIVE   RBC / HPF 0-5 0 - 5 RBC/hpf   WBC, UA 0-5 0 - 5 WBC/hpf   Bacteria, UA NONE SEEN NONE SEEN   Squamous Epithelial / LPF 0-5 0 - 5   Mucus PRESENT    Hyaline Casts, UA PRESENT    Ca Oxalate Crys, UA PRESENT       Assessment & Plan:   1. Moderate episode of recurrent major depressive disorder (HCC)  - traZODone (DESYREL) 50 MG tablet; Take 1 tablet (50 mg total) by mouth at bedtime.  Dispense: 30 tablet; Refill: 1  2. Other insomnia  - traZODone (DESYREL) 50 MG tablet; Take 1 tablet (50 mg total) by mouth at bedtime.  Dispense: 30 tablet; Refill: 1  3. Inguinal hernia of right side without obstruction or gangrene - follow-up with surgeon  4. Need for influenza vaccination  - Flu Vaccine QUAD 6+ mos PF IM (Fluarix Quad PF)   Follow up plan: Return in about 4 weeks (around 06/11/2021) for follow up.

## 2021-05-14 NOTE — Telephone Encounter (Signed)
Pt called stating that the medication, trazodone, that was sent in for him. He states that this medication was the first one that he tried and it did not help. He is requesting to have something else called in for him. Pt states that he has only been getting 3 hours of sleep and is really needing help today. Please advise.           [id number] [place], [place] - [address]  [place] [address]  Phone: [phone] Fax: [phone]  Hours: Not open 24 hours

## 2021-05-14 NOTE — Telephone Encounter (Signed)
Called patient and he went to the ER but wants to be ref to see a Psychologist, sport and exercise.

## 2021-05-14 NOTE — ED Provider Notes (Signed)
[facility] Emergency Department Provider Note  ____________________________________________  Time seen: Approximately 1:00 AM  I have reviewed the triage vital signs and the nursing notes.   HISTORY  Chief Complaint Abdominal Pain   HPI Daniel Garner is a 65 y.o. male with history of constipation, osteoarthritis, GERD, BPH who presents for evaluation of abdominal pain.  Patient reports intermittent abdominal pain over the last several weeks.  He reports that the pain may be different at different times and in different locations.  Sometimes his lower sharp cramping pain, sometimes burning pain on the right lower quadrant, sometimes burning sensation in the epigastric region that he associates with reflux.  This evening he reports that the pain was worse and he describes as sharp cramping pain diffusely but worse on the right lower quadrant.  No nausea or vomiting, no fever or chills.  Reports having little difficulty initiating his urine stream but that is chronic for him, denies dysuria or hematuria.  Patient does have a known inguinal hernia.  Denies any changes in that.  Patient was constipated yesterday therefore took some MiraLAX today to see if the pain would resolve.  At this time he has no pain.   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   Prostatism 01/13/2015    Past Surgical History:  Procedure Laterality Date    COLONOSCOPY  2000   COLONOSCOPY WITH PROPOFOL N/A 08/06/2015   Procedure: COLONOSCOPY WITH PROPOFOL;  Surgeon: Christene Lye, MD;  Location: [facility] ENDOSCOPY;  Service: Endoscopy;  Laterality: N/A;   HERNIA REPAIR  2007,2012   TONSILLECTOMY      Prior to Admission medications   Medication Sig Start Date End Date Taking? Authorizing Provider  Cholecalciferol (VITAMIN D-3) 1000 units CAPS Take by mouth.    [provider]  cyanocobalamin 1000 MCG tablet Take 1,000 mcg by mouth daily.    [provider]  sildenafil (REVATIO) 20 MG tablet Take 1-3 tabs 0.5-2 hours prior to planned sexual activity 02/26/20   Towanda Malkin, MD  temazepam (RESTORIL) 15 MG capsule Take 1 capsule (15 mg total) by mouth at bedtime as needed for sleep. 05/05/21   Steele Sizer, MD  vitamin C (ASCORBIC ACID) 500 MG tablet Take 500 mg by mouth daily.    [provider]  Vitamin E 100 units TABS Take by mouth.    [provider]    Allergies Patient has no known allergies.  Family History  Problem Relation Age of Onset   Arthritis Mother    Colon cancer Father    Prostate cancer Father    Lung cancer Father     Social History Social History   Tobacco Use   Smoking status: Never   Smokeless tobacco: Never  Vaping Use   Vaping Use: Never used  Substance Use Topics   Alcohol use: No    Alcohol/week: 0.0 standard drinks   Drug use: No    Review of  Systems  Constitutional: Negative for fever. Eyes: Negative for visual changes. ENT: Negative for sore throat. Neck: No neck pain  Cardiovascular: Negative for chest pain. Respiratory: Negative for shortness of breath. Gastrointestinal: + abdominal pain. No vomiting or diarrhea. Genitourinary: Negative for dysuria. Musculoskeletal: Negative for back pain. Skin: Negative for rash. Neurological: Negative for headaches, weakness or numbness. Psych: No SI or  HI  ____________________________________________   PHYSICAL EXAM:  VITAL SIGNS: ED Triage Vitals  Enc Vitals Group     BP 05/13/21 2006 (!) 147/97     Pulse Rate 05/13/21 2006 86     Resp 05/13/21 2006 18     Temp 05/13/21 2006 98.4 F (36.9 C)     Temp Source 05/13/21 2006 Oral     SpO2 05/13/21 2006 99 %     Weight 05/13/21 2008 180 lb (81.6 kg)     Height 05/13/21 2008 5\' 10"  (1.778 m)     Head Circumference --      Peak Flow --      Pain Score 05/13/21 2007 8     Pain Loc --      Pain Edu? --      Excl. in [place]? --     Constitutional: Alert and oriented. Well appearing and in no apparent distress. HEENT:      Head: Normocephalic and atraumatic.         Eyes: Conjunctivae are normal. Sclera is non-icteric.       Mouth/Throat: Mucous membranes are moist.       Neck: Supple with no signs of meningismus. Cardiovascular: Regular rate and rhythm. No murmurs, gallops, or rubs. 2+ symmetrical distal pulses are present in all extremities. No JVD. Respiratory: Normal respiratory effort. Lungs are clear to auscultation bilaterally.  Gastrointestinal: Soft, non tender, and non distended with positive bowel sounds. No rebound or guarding. Genitourinary: R inguinal hernia, no overlying skin changes no tenderness Musculoskeletal:  No edema, cyanosis, or erythema of extremities. Neurologic: Normal speech and language. Face is symmetric. Moving all extremities. No gross focal neurologic deficits are appreciated. Skin: Skin is warm, dry and intact. No rash noted. Psychiatric: Mood and affect are normal. Speech and behavior are normal.  ____________________________________________   LABS (all labs ordered are listed, but only abnormal results are displayed)  Labs Reviewed  URINALYSIS, ROUTINE W REFLEX MICROSCOPIC - Abnormal; Notable for the following components:      Result Value   Color, Urine YELLOW (*)    APPearance CLEAR (*)    Hgb urine dipstick SMALL (*)    All other  components within normal limits  COMPREHENSIVE METABOLIC PANEL  CBC   ____________________________________________  EKG  none  ____________________________________________  RADIOLOGY  I have personally reviewed the images performed during this visit and I agree with the Radiologist's read.   Interpretation by Radiologist:  CT ABDOMEN PELVIS W CONTRAST  Result Date: 05/14/2021 CLINICAL DATA:  Periumbilical and right lower quadrant pain for several hours EXAM: CT ABDOMEN AND PELVIS WITH CONTRAST TECHNIQUE: Multidetector CT imaging of the abdomen and pelvis was performed using the standard protocol following bolus administration of intravenous contrast. CONTRAST:  164mL OMNIPAQUE IOHEXOL 300 MG/ML  SOLN COMPARISON:  03/31/2021 FINDINGS: Lower chest: No acute abnormality. Hepatobiliary: Liver demonstrates multiple hypodensities similar to that seen on the prior exam consistent with cysts. The gallbladder is within normal limits. Pancreas: Unremarkable. No pancreatic ductal dilatation or surrounding inflammatory changes. Spleen: Normal in size without focal abnormality. Adrenals/Urinary Tract: Adrenal glands are within normal  limits. Kidneys demonstrate a normal enhancement pattern bilaterally. Stable punctate right renal stone is noted. No left-sided renal calculi are noted. Delayed images demonstrate normal excretion of contrast material. A small left renal cyst is noted. The bladder is well distended. Stomach/Bowel: No obstructive or inflammatory changes of colon are seen. The appendix is within normal limits. Stomach is within normal limits. A few loops of mildly dilated small bowel are noted with fluid within centrally although no definitive obstructive changes are seen. This likely represents and enteritis and would correspond with the patient's given clinical symptomatology. Vascular/Lymphatic: Aortic atherosclerosis. No enlarged abdominal or pelvic lymph nodes. Reproductive: Prostate is  unremarkable. Other: Right inguinal hernia is noted containing a small amount of fluid slightly increased when compared with the prior exam. Musculoskeletal: Degenerative changes of the hip joints are noted. No acute bony abnormality is seen. IMPRESSION: A few fluid-filled loops of mildly dilated small bowel without definitive obstruction. These changes likely represent a degree of enteritis. Hepatic and renal cysts. Stable punctate nonobstructing right renal stone. Right inguinal hernia slightly increased in size with a small amount of fluid within. This could also correspond with the patient's clinical symptomatology. Electronically Signed   By: Inez Catalina M.D.   On: 05/14/2021 00:49     ____________________________________________   PROCEDURES  Procedure(s) performed: None Procedures   Critical Care performed:  None ____________________________________________   INITIAL IMPRESSION / ASSESSMENT AND PLAN / ED COURSE  65 y.o. male with history of constipation, osteoarthritis, GERD, BPH who presents for evaluation of abdominal pain.  Patient with intermittent abdominal pain for the last several weeks.  No pain at this time.  Abdomen is soft and nontender throughout.  He has a right inguinal hernia that is nonreducible with no overlying skin changes, no tenderness.  I reviewed patient's chart with CT scan done 5 weeks ago that showed the presence of this hernia.  CT also showing history of nephrolithiasis.  Differential diagnosis including passing kidney stones versus UTI versus pyelonephritis versus appendicitis versus diverticulitis.  Hernia looks stable with no signs of incarceration or strangulation.  Labs showing normal chemistry panel, no white count, UA with small amount of blood but no signs of UTI.  Repeat CT showing some fluid-filled loops of bowel possibly from the MiraLAX that patient took yesterday were possible from some enteritis.  He has renal stones with no ureteral stones.  And  according to radiology the right inguinal hernia looks slightly increased in size but no signs of incarceration and strangulation.  Will refer patient to surgery for evaluation of the hernia.  We talked about dietary changes to prevent kidney stones.  At this time with unremarkable work-up, no pain or tenderness on exam patient is stable for discharge home.  Discussed my standard return precautions.  History gathered from patient and his wife and plan discussed with both of them      _____________________________________________ Please note:  Patient was evaluated in Emergency Department today for the symptoms described in the history of present illness. Patient was evaluated in the context of the global COVID-19 pandemic, which necessitated consideration that the patient might be at risk for infection with the SARS-CoV-2 virus that causes COVID-19. Institutional protocols and algorithms that pertain to the evaluation of patients at risk for COVID-19 are in a state of rapid change based on information released by regulatory bodies including the CDC and federal and state organizations. These policies and algorithms were followed during the patient's care in the ED.  Some  ED evaluations and interventions may be delayed as a result of limited staffing during the pandemic.    Controlled Substance Database was reviewed by me. ____________________________________________   FINAL CLINICAL IMPRESSION(S) / ED DIAGNOSES   Final diagnoses:  Generalized abdominal pain  Unilateral recurrent inguinal hernia without obstruction or gangrene  Nephrolithiasis      NEW MEDICATIONS STARTED DURING THIS VISIT:  ED Discharge Orders          Ordered    Ambulatory referral to General Surgery       Comments: Inguinla hernia   05/14/21 0106             Note:  This document was prepared using Dragon voice recognition software and may include unintentional dictation errors.    Rudene Re,  MD 05/14/21 [phone]

## 2021-05-15 ENCOUNTER — Other Ambulatory Visit: Payer: Self-pay | Admitting: Nurse Practitioner

## 2021-05-15 DIAGNOSIS — G4709 Other insomnia: Secondary | ICD-10-CM

## 2021-05-15 MED ORDER — QUVIVIQ 25 MG PO TABS: 25.0000 mg | ORAL_TABLET | Freq: Every evening | ORAL | 0 refills | Status: DC | PRN

## 2021-05-15 NOTE — Telephone Encounter (Signed)
Patient notified

## 2021-05-15 NOTE — Telephone Encounter (Signed)
Patient stated he went back to taking Temazepam at taking 2 qhs instead of 1 per Dr Ancil Boozer. The Trazodone he had already tried that prior and it did not work for him. Patient stated he will not picked up the new med. He will continue to try Temazepam. If he need the other medication if he pick up at that time.

## 2021-05-19 ENCOUNTER — Ambulatory Visit: Payer: Self-pay | Admitting: Family Medicine

## 2021-05-22 ENCOUNTER — Ambulatory Visit: Payer: BLUE CROSS/BLUE SHIELD | Admitting: Surgery

## 2021-05-25 ENCOUNTER — Ambulatory Visit (INDEPENDENT_AMBULATORY_CARE_PROVIDER_SITE_OTHER): Payer: Self-pay | Admitting: Surgery

## 2021-05-25 ENCOUNTER — Other Ambulatory Visit: Payer: Self-pay | Admitting: Family Medicine

## 2021-05-25 ENCOUNTER — Other Ambulatory Visit: Payer: Self-pay

## 2021-05-25 ENCOUNTER — Encounter: Payer: Self-pay | Admitting: Surgery

## 2021-05-25 VITALS — BP 146/91 | HR 73 | Temp 98.5°F | Ht 70.5 in | Wt 177.4 lb

## 2021-05-25 DIAGNOSIS — K4091 Unilateral inguinal hernia, without obstruction or gangrene, recurrent: Secondary | ICD-10-CM

## 2021-05-25 DIAGNOSIS — K4090 Unilateral inguinal hernia, without obstruction or gangrene, not specified as recurrent: Secondary | ICD-10-CM

## 2021-05-25 NOTE — Telephone Encounter (Signed)
Pt called saying he would like a prescription of the temazepam 15 mg two pills at night.  He took this last night and it helped him sleep.  CB#  [phone]  [facility]

## 2021-05-25 NOTE — Telephone Encounter (Signed)
Requested medications are due for refill today.  unknown  Requested medications are on the active medications list.  yes  Last refill. 05/25/2021  Future visit scheduled.   yes  Notes to clinic.  Historical medication. Medication not delegated.

## 2021-05-25 NOTE — Patient Instructions (Signed)
If you have any concerns or questions, please feel free to call our office.   Inguinal Hernia, Adult An inguinal hernia is when fat or your intestines push through a weak spot in a muscle where your leg meets your lower belly (groin). This causes a bulge. This kind of hernia could also be: In your scrotum, if you are male. In folds of skin around your vagina, if you are male. There are three types of inguinal hernias: Hernias that can be pushed back into the belly (are reducible). This type rarely causes pain. Hernias that cannot be pushed back into the belly (are incarcerated). Hernias that cannot be pushed back into the belly and lose their blood supply (are strangulated). This type needs emergency surgery. What are the causes? This condition is caused by having a weak spot in the muscles or tissues in your groin. This develops over time. The hernia may poke through the weak spot when you strain your lower belly muscles all of a sudden, such as when you: Lift a heavy object. Strain to poop (have a bowel movement). Trouble pooping (constipation) can lead to straining. Cough. What increases the risk? This condition is more likely to develop in: Males. Pregnant females. People who: Are overweight. Work in jobs that require long periods of standing or heavy lifting. Have had an inguinal hernia before. Smoke or have lung disease. These factors can lead to long-term (chronic) coughing. What are the signs or symptoms? Symptoms may depend on the size of the hernia. Often, a small hernia has no symptoms. Symptoms of a larger hernia may include: A bulge in the groin area. This is easier to see when standing. You might not be able to see it when you are lying down. Pain or burning in the groin. This may get worse when you lift, strain, or cough. A dull ache or a feeling of pressure in the groin. An abnormal bulge in the scrotum, in males. Symptoms of a strangulated inguinal hernia may  include: A bulge in your groin that is very painful and tender to the touch. A bulge that turns red or purple. Fever, feeling like you may vomit (nausea), and vomiting. Not being able to poop or to pass gas. How is this treated? Treatment depends on the size of your hernia and whether you have symptoms. If you do not have symptoms, your doctor may have you watch your hernia carefully and have you come in for follow-up visits. If your hernia is large or if you have symptoms, you may need surgery to repair the hernia. Follow these instructions at home: Lifestyle Avoid lifting heavy objects. Avoid standing for long amounts of time. Do not smoke or use any products that contain nicotine or tobacco. If you need help quitting, ask your doctor. Stay at a healthy weight. Prevent trouble pooping You may need to take these actions to prevent or treat trouble pooping: Drink enough fluid to keep your pee (urine) pale yellow. Take over-the-counter or prescription medicines. Eat foods that are high in fiber. These include beans, whole grains, and fresh fruits and vegetables. Limit foods that are high in fat and sugar. These include fried or sweet foods. General instructions You may try to push your hernia back in place by very gently pressing on it when you are lying down. Do not try to push the bulge back in if it will not go in easily. Watch your hernia for any changes in shape, size, or color. Tell your doctor if   you see any changes. Take over-the-counter and prescription medicines only as told by your doctor. Keep all follow-up visits. Contact a doctor if: You have a fever or chills. You have new symptoms. Your symptoms get worse. Get help right away if: You have pain in your groin that gets worse all of a sudden. You have a bulge in your groin that: Gets bigger all of a sudden, and it does not get smaller after that. Turns red or purple. Is painful when you touch it. You are a male, and you  have: Sudden pain in your scrotum. A sudden change in the size of your scrotum. You cannot push the hernia back in place by very gently pressing on it when you are lying down. You feel like you may vomit, and that feeling does not go away. You keep vomiting. You have a fast heartbeat. You cannot poop or pass gas. These symptoms may be an emergency. Get help right away. Call your local emergency services (911 in the [place] Do not wait to see if the symptoms will go away. Do not drive yourself to the hospital. Summary An inguinal hernia is when fat or your intestines push through a weak spot in a muscle where your leg meets your lower belly (groin). This causes a bulge. If you do not have symptoms, you may not need treatment. If you have symptoms or a large hernia, you may need surgery. Avoid lifting heavy objects. Also, avoid standing for long amounts of time. Do not try to push the bulge back in if it will not go in easily. This information is not intended to replace advice given to you by your health care provider. Make sure you discuss any questions you have with your health care provider. Document Revised: 03/11/2020 Document Reviewed: 03/11/2020 Elsevier Patient Education  2022 [employer].

## 2021-05-25 NOTE — Telephone Encounter (Signed)
Copied from [place] [id number]. Topic: Quick Communication - Rx Refill/Question >> May 25, 2021  4:43 PM Yvette Rack wrote: Medication: temazepam (RESTORIL) 15 MG capsule  Has the patient contacted their pharmacy? Yes.  Pt told to contact provider (Agent: If no, request that the patient contact the pharmacy for the refill. If patient does not wish to contact the pharmacy document the reason why and proceed with request.) (Agent: If yes, when and what did the pharmacy advise?)  Preferred Pharmacy (with phone number or street name): [id number] [place], [place]  Phone: [phone]   Fax: [phone]  Has the patient been seen for an appointment in the last year OR does the patient have an upcoming appointment? Yes.    Agent: Please be advised that RX refills may take up to 3 business days. We ask that you follow-up with your pharmacy.

## 2021-05-26 ENCOUNTER — Encounter: Payer: Self-pay | Admitting: Surgery

## 2021-05-26 ENCOUNTER — Other Ambulatory Visit: Payer: Self-pay | Admitting: Family Medicine

## 2021-05-26 ENCOUNTER — Other Ambulatory Visit: Payer: Self-pay | Admitting: Emergency Medicine

## 2021-05-26 MED ORDER — TEMAZEPAM 15 MG PO CAPS: 30.0000 mg | ORAL_CAPSULE | Freq: Every evening | ORAL | 3 refills | Status: DC | PRN

## 2021-05-26 MED ORDER — TEMAZEPAM 30 MG PO CAPS: 30.0000 mg | ORAL_CAPSULE | Freq: Every evening | ORAL | 0 refills | Status: DC | PRN

## 2021-05-26 NOTE — Progress Notes (Signed)
05/25/2021  Reason for Visit:  Right inguinal hernia  History of Present Illness: Daniel Garner is a 65 y.o. male presenting for evaluation of a right inguinal hernia.  The patient presented to the ED on 05/14/21 with lower abdominal pain.  He had a prior visit for the same on 03/31/21.  He had CT scans of abdomen and pelvis done on both occasions, but only the read from 05/14/21 showed a right inguinal hernia.  When I evaluate both CT scan sets of images, I think there's actually bilateral inguinal hernias, with right larger than left, but the right hernia was present on both CT scans.    The patient reports that he feels an area of bulging on the right groin, but no bulging sensation on the left groin.  He has had bilateral open inguinal hernia repairs done in the past in 2007 and 2012, each side done separately.  The right side bulging has been ongoing for a few months, but he notices now that the bulging is more sensitive.  The pain that he gets though is more in the lower abdomen compared to the actual groin or scrotum.  On the left side, he feels sometimes discomfort going to his upper thigh and also in the lower abdomen.  Denies any fevers, chills, chest pain, shortness of breath, nausea, vomiting.  He does report some issues with constipation.  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 Authorizing Provider  Cholecalciferol (VITAMIN  D-3) 1000 units CAPS Take by mouth.   Yes [provider]  cyanocobalamin 1000 MCG tablet Take 1,000 mcg by mouth daily.   Yes [provider]  sildenafil (REVATIO) 20 MG tablet Take 1-3 tabs 0.5-2 hours prior to planned sexual activity 02/26/20  Yes Towanda Malkin, MD  vitamin C (ASCORBIC ACID) 500 MG tablet Take 500 mg by mouth daily.   Yes [provider]  Vitamin E 100 units TABS Take by mouth.   Yes [provider]  temazepam (RESTORIL) 30 MG capsule Take 1 capsule (30 mg total) by mouth at bedtime as needed for sleep. 05/26/21   Steele Sizer, MD    Allergies: No Known Allergies  Social History:  reports that he has never smoked. He has never used smokeless tobacco. He reports that he does not drink alcohol and does not use drugs.   Family History: Family History  Problem Relation Age of Onset   Arthritis Mother    Colon cancer Father    Prostate cancer Father    Lung cancer Father     Review of Systems: Review of Systems  Constitutional:  Negative for chills and fever.  HENT:  Negative for hearing loss.   Respiratory:  Negative for shortness of breath.   Cardiovascular:  Negative for chest pain.  Gastrointestinal:  Positive for abdominal pain and constipation. Negative for diarrhea, nausea and vomiting.  Genitourinary:  Negative for dysuria.  Musculoskeletal:  Negative for myalgias.  Skin:  Negative for rash.  Neurological:  Negative for dizziness.  Psychiatric/Behavioral:  Negative for depression.    Physical Exam BP (!) 146/91   Pulse 73   Temp 98.5 F (36.9 C) (Oral)   Ht 5' 10.5" (1.791 m)   Wt 177 lb 6.4 oz (80.5 kg)   SpO2 98%   BMI 25.09 kg/m  CONSTITUTIONAL: No acute distress, well nourished. HEENT:  Normocephalic, atraumatic, extraocular motion intact. NECK: Trachea is midline, and there is no jugular venous distension.  RESPIRATORY:  Lungs are clear, and breath sounds are equal bilaterally. Normal  respiratory effort without pathologic use of accessory muscles. CARDIOVASCULAR: Heart is regular without murmurs, gallops, or rubs. GI: The abdomen is soft non-distended currently non-tender.  He has a reducible right inguinal hernia with some discomfort, but no peritonitis or any evidence for strangulation or incarceration.  On the left groin, there may be a subtle area of bulging but it is somewhat harder to feel.  May also have umbilical hernia but not fully palpable as it appears to be a very small defect if any.  MUSCULOSKELETAL:  Normal muscle strength and tone in all four extremities.  No peripheral edema or cyanosis. SKIN: Skin turgor is normal. There are no pathologic skin lesions.  NEUROLOGIC:  Motor and sensation is grossly normal.  Cranial nerves are grossly intact. PSYCH:  Alert and oriented to person, place and time. Affect is normal.  Laboratory Analysis: Labs from 05/13/21: Na 136, K 4.1, Cl 101, CO2 29, BUN 11, Cr 0.99.  LFTs within normal.  WBC 8.9, Hgb 15.5, Hct 45.9, Plt 338.  Imaging: CT abdomen/pelvis on 05/14/21: IMPRESSION: A few fluid-filled loops of mildly dilated small bowel without definitive obstruction. These changes likely represent a degree of enteritis.   Hepatic and renal cysts.   Stable punctate nonobstructing right renal stone.   Right inguinal hernia slightly increased in size with a small amount of fluid within. This could also correspond with the patient's clinical symptomatology.  Assessment and Plan: This is a 65 y.o. male with possibly bilateral inguinal hernias.  --Discussed with the patient the findings on CT scan and on exam today.  He very clearly has a right inguinal hernia recurrence and may have a subtle left inguinal hernia recurrence.  Discussed with him that given the symptoms of discomfort on both sides, it would be recommended to proceed with surgical repair.  Given that he had them repaired via open technique in the past, we can  proceed with minimally invasive repair, with robotic assisted bilateral inguinal hernia repair.  Discussed with him that we can evaluate both groin areas at the same time and if there's truly a left inguinal hernia recurrence, can repair it at the same time as the right side. --The patient wants to proceed with surgery, but reports that he just recently changed insurances and it would not go into effect until later in November.  He would like to wait until then to schedule any surgery. I encouraged him to contact us when he is ready, and I also gave him return precautions.  He is aware that if date of surgery is more than 30 days from now, would have to schedule a new follow up appointment for H&P update.  I spent 45 minutes dedicated to the care of this patient on the date of this encounter to include pre-visit review of records, face-to-face time with the patient discussing diagnosis and management, and any post-visit  coordination of care.   Melvyn Neth, [facility]

## 2021-05-26 NOTE — Telephone Encounter (Signed)
Patient was unable to sleep due to not having medication mentioned below. Patient states  he ran out on Sunday and would like request expedited. Patient would like a follow up call when rx is sent [phone]    [id number] [place], [address] Phone:  [phone]  Fax:  [phone]

## 2021-06-09 ENCOUNTER — Encounter: Payer: Self-pay | Admitting: Family Medicine

## 2021-06-09 ENCOUNTER — Other Ambulatory Visit: Payer: Self-pay

## 2021-06-09 ENCOUNTER — Ambulatory Visit (INDEPENDENT_AMBULATORY_CARE_PROVIDER_SITE_OTHER): Payer: Self-pay | Admitting: Family Medicine

## 2021-06-09 VITALS — BP 130/82 | HR 92 | Temp 98.3°F | Resp 16 | Ht 71.0 in | Wt 179.2 lb

## 2021-06-09 DIAGNOSIS — M160 Bilateral primary osteoarthritis of hip: Secondary | ICD-10-CM

## 2021-06-09 DIAGNOSIS — K4090 Unilateral inguinal hernia, without obstruction or gangrene, not specified as recurrent: Secondary | ICD-10-CM | POA: Insufficient documentation

## 2021-06-09 DIAGNOSIS — N529 Male erectile dysfunction, unspecified: Secondary | ICD-10-CM

## 2021-06-09 DIAGNOSIS — I700 Atherosclerosis of aorta: Secondary | ICD-10-CM

## 2021-06-09 DIAGNOSIS — Z125 Encounter for screening for malignant neoplasm of prostate: Secondary | ICD-10-CM

## 2021-06-09 DIAGNOSIS — N200 Calculus of kidney: Secondary | ICD-10-CM

## 2021-06-09 DIAGNOSIS — E7800 Pure hypercholesterolemia, unspecified: Secondary | ICD-10-CM

## 2021-06-09 DIAGNOSIS — R399 Unspecified symptoms and signs involving the genitourinary system: Secondary | ICD-10-CM

## 2021-06-09 DIAGNOSIS — Z1211 Encounter for screening for malignant neoplasm of colon: Secondary | ICD-10-CM

## 2021-06-09 DIAGNOSIS — Z0000 Encounter for general adult medical examination without abnormal findings: Secondary | ICD-10-CM

## 2021-06-09 DIAGNOSIS — F5104 Psychophysiologic insomnia: Secondary | ICD-10-CM

## 2021-06-09 MED ORDER — SILDENAFIL CITRATE 100 MG PO TABS: 50.0000 mg | ORAL_TABLET | Freq: Every day | ORAL | 1 refills | Status: DC | PRN

## 2021-06-09 MED ORDER — ROSUVASTATIN CALCIUM 10 MG PO TABS: 10.0000 mg | ORAL_TABLET | Freq: Every day | ORAL | 1 refills | Status: DC

## 2021-06-09 MED ORDER — TAMSULOSIN HCL 0.4 MG PO CAPS: 0.4000 mg | ORAL_CAPSULE | Freq: Every day | ORAL | 3 refills | Status: DC

## 2021-06-09 NOTE — Progress Notes (Signed)
Name: Daniel Garner   MRN: [id number]    DOB: September 01, 1955   Date:06/09/2021       Progress Note  Subjective  Chief Complaint  Annual Exam  HPI  Patient presents for annual CPE  and follow up ( patient is aware of cost)   Atherosclerosis of Aorta: found on CT done 10/22, discussed importance of taking statin therapy and consider low dose aspirin today with patient.   Right inguinal hernia: seen at [facility], seeing a general surgeon but does not have surgery, he will check the cost to have it done at [facility]  Dizziness: he feels lightheaded , happened earlier today when he got up . Discussed orthostatic changes  LUTS: positive below, CT Abdomen showed normal prostate, we will try flomax.   Dysthymia: he got laid off work last week, he states he states he feels weird , started a little before he lost his job and has been worse since last week, also feels nervous when around a lot of people. He also noticed his left hand shaking this morning.   ED: taking sildenafil and would like a refill.   Insomnia: he is taking Temazepam at night and has been able to fall and stay asleep   IPSS Questionnaire (AUA-7): Over the past month.   1)  How often have you had a sensation of not emptying your bladder completely after you finish urinating?  3 - About half the time  2)  How often have you had to urinate again less than two hours after you finished urinating? 5 - Almost always  3)  How often have you found you stopped and started again several times when you urinated?  3 - About half the time  4) How difficult have you found it to postpone urination?  1 - Less than 1 time in 5  5) How often have you had a weak urinary stream?  4 - More than half the time  6) How often have you had to push or strain to begin urination?  1 - Less than 1 time in 5  7) How many times did you most typically get up to urinate from the time you went to bed until the time you got up in the morning?  2 - 2 times  Total score:   0-7 mildly symptomatic   8-19 moderately symptomatic   20-35 severely symptomatic     Diet: discussed balanced diet  Exercise: he needs to increase physical activity   Depression: phq 9 is positive Depression screen [facility] 2/9 06/09/2021 05/14/2021 04/22/2021 04/06/2021 02/06/2021  Decreased Interest 1 2 1 1 1   Down, Depressed, Hopeless 1 2 2 1 1   PHQ - 2 Score 2 4 3 2 2   Altered sleeping 3 3 3  0 0  Tired, decreased energy 1 2 0 0 0  Change in appetite 2 2 2 2  0  Feeling bad or failure about yourself  1 1 1 1  0  Trouble concentrating 0 1 1 1  0  Moving slowly or fidgety/restless 0 0 0 0 0  Suicidal thoughts 0 0 0 0 0  PHQ-9 Score 9 13 10 6 2   Difficult doing work/chores Somewhat difficult Somewhat difficult - Somewhat difficult Not difficult at all  Some recent data might be hidden    Hypertension:  BP Readings from Last 3 Encounters:  06/09/21 130/82  05/25/21 (!) 146/91  05/14/21 124/80    Obesity: Wt Readings from Last 3 Encounters:  06/09/21 179 lb 3.2  oz (81.3 kg)  05/25/21 177 lb 6.4 oz (80.5 kg)  05/14/21 175 lb 3.2 oz (79.5 kg)   BMI Readings from Last 3 Encounters:  06/09/21 24.99 kg/m  05/25/21 25.09 kg/m  05/14/21 23.11 kg/m     Lipids:  Lab Results  Component Value Date   CHOL 214 (H) 10/03/2019   CHOL 204 (H) 08/16/2018   CHOL 232 (H) 08/16/2016   Lab Results  Component Value Date   HDL 64 10/03/2019   HDL 60 08/16/2018   HDL 60 08/16/2016   Lab Results  Component Value Date   LDLCALC 136 (H) 10/03/2019   LDLCALC 128 (H) 08/16/2018   LDLCALC 157 (H) 08/16/2016   Lab Results  Component Value Date   TRIG 52 10/03/2019   TRIG 72 08/16/2018   TRIG 74 08/16/2016   Lab Results  Component Value Date   CHOLHDL 3.3 10/03/2019   CHOLHDL 3.4 08/16/2018   CHOLHDL 3.9 08/16/2016   No results found for: LDLDIRECT Glucose:  Glucose, Bld  Date Value Ref Range Status  05/13/2021 99 70 - 99 mg/dL Final    Comment:    Glucose reference range  applies only to samples taken after fasting for at least 8 hours.  03/31/2021 117 (H) 70 - 99 mg/dL Final    Comment:    Glucose reference range applies only to samples taken after fasting for at least 8 hours.  10/03/2019 81 65 - 99 mg/dL Final    Comment:    .            Fasting reference interval .     [place] Office Visit from 05/14/2021 in [facility]  AUDIT-C Score 0       Married STD testing and prevention (HIV/chl/gon/syphilis): 08/16/18 Hep C: 08/16/16  Skin cancer: Discussed monitoring for atypical lesions Colorectal cancer: 08/06/15 Prostate cancer:  Lab Results  Component Value Date   PSA 2.4 10/03/2019   PSA 2.4 08/16/2016     Lung cancer: Low Dose CT Chest recommended if Age 84-80 years, 30 pack-year currently smoking OR have quit w/in 15years. Patient does not qualify.   ECG:  03/31/21  Vaccines:   Shingrix: he does not have insurance and we will hold off for now  Pneumonia: educated and discussed with patient. Flu: educated and discussed with patient.  Advanced Care Planning: A voluntary discussion about advance care planning including the explanation and discussion of advance directives.  Discussed health care proxy and Living will, and the patient was able to identify a health care proxy as wife.  Patient does not have a living will at present time. If patient does have living will, I have requested they bring this to the clinic to be scanned in to their chart.  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   Prostatism 01/13/2015    Past Surgical History:   Procedure Laterality Date   COLONOSCOPY  2000   COLONOSCOPY WITH PROPOFOL N/A 08/06/2015   Procedure: COLONOSCOPY WITH PROPOFOL;  Surgeon: Christene Lye, MD;  Location: [facility] ENDOSCOPY;  Service: Endoscopy;  Laterality: N/A;   HERNIA REPAIR  [phone]  TONSILLECTOMY      Family History  Problem Relation Age of Onset   Arthritis Mother    Colon cancer Father    Prostate cancer Father    Lung cancer Father     Social History   Socioeconomic History   Marital status: Married    Spouse name: Verl Bangs   Number of children: 3   Years of education: Not on file   Highest education level: Some college, no degree  Occupational History   Occupation: Librarian, academic   Tobacco Use   Smoking status: Never   Smokeless tobacco: Never  Vaping Use   Vaping Use: Never used  Substance and Sexual Activity   Alcohol use: No    Alcohol/week: 0.0 standard drinks   Drug use: No   Sexual activity: Yes    Partners: Female    Birth control/protection: None  Other Topics Concern   Not on file  Social History Narrative   Not on file   Social Determinants of Health   Financial Resource Strain: Low Risk    Difficulty of Paying Living Expenses: Not very hard  Food Insecurity: No Food Insecurity   Worried About Charity fundraiser in the Last Year: Never true   [place] in the Last Year: Never true  Transportation Needs: No Transportation Needs   Lack of Transportation (Medical): No   Lack of Transportation (Non-Medical): No  Physical Activity: Insufficiently Active   Days of Exercise per Week: 2 days   Minutes of Exercise per Session: 40 min  Stress: No Stress Concern Present   Feeling of Stress : Only a little  Social Connections: Engineer, building services of Communication with Friends and Family: More than three times a week   Frequency of Social Gatherings with Friends and Family: Once a week   Attends Religious Services: 1 to 4 times per year   Active Member of  [employer] or Organizations: Yes   Attends Archivist Meetings: 1 to 4 times per year   Marital Status: Married  Human resources officer Violence: Not At Risk   Fear of Current or Ex-Partner: No   Emotionally Abused: No   Physically Abused: No   Sexually Abused: No     Current Outpatient Medications:    Cholecalciferol (VITAMIN D-3) 1000 units CAPS, Take by mouth., Disp: , Rfl:    cyanocobalamin 1000 MCG tablet, Take 1,000 mcg by mouth daily., Disp: , Rfl:    sildenafil (REVATIO) 20 MG tablet, Take 1-3 tabs 0.5-2 hours prior to planned sexual activity, Disp: 30 tablet, Rfl: 5   temazepam (RESTORIL) 30 MG capsule, Take 1 capsule (30 mg total) by mouth at bedtime as needed for sleep., Disp: 30 capsule, Rfl: 0   vitamin C (ASCORBIC ACID) 500 MG tablet, Take 500 mg by mouth daily., Disp: , Rfl:    Vitamin E 100 units TABS, Take by mouth., Disp: , Rfl:   No Known Allergies   ROS  Constitutional: Negative for fever or weight change.  Respiratory: Negative for cough and shortness of breath.   Cardiovascular: Negative for chest pain or palpitations.  Gastrointestinal: Negative for abdominal pain, no bowel changes.  Musculoskeletal: Negative for gait problem or joint swelling. He has intermittent groin pain  Skin: Negative for rash.  Neurological: Negative for dizziness or headache.  No other specific complaints in a complete review of systems (except as listed in HPI above).    Objective  Vitals:   06/09/21 1513  BP:  130/82  Pulse: 92  Resp: 16  Temp: 98.3 F (36.8 C)  TempSrc: Oral  SpO2: 97%  Weight: 179 lb 3.2 oz (81.3 kg)  Height: 5\' 11"  (1.803 m)    Body mass index is 24.99 kg/m.  Physical Exam  Constitutional: Patient appears well-developed and well-nourished. No distress.  HENT: Head: Normocephalic and atraumatic. Ears: B TMs ok, no erythema or effusion; Nose: Nose normal. Mouth/Throat: Oropharynx is clear and moist. No oropharyngeal exudate.  Eyes: Conjunctivae  and EOM are normal. Pupils are equal, round, and reactive to light. No scleral icterus.  Neck: Normal range of motion. Neck supple. No JVD present. No thyromegaly present.  Cardiovascular: Normal rate, regular rhythm and normal heart sounds.  No murmur heard. No BLE edema. Pulmonary/Chest: Effort normal and breath sounds normal. No respiratory distress. Abdominal: Soft. Bowel sounds are normal, no distension. There is no tenderness. no masses MALE GENITALIA: Normal descended testes bilaterally, no masses palpated, large right inguinal hernia, left side is smaller no lesions, no discharge  RECTAL: Prostate normal slightly enlarged and soft  orectal masses or hemorrhoids Musculoskeletal: Normal range of motion, no joint effusions. No gross deformities Neurological: he is alert and oriented to person, place, and time. No cranial nerve deficit. Coordination, balance, strength, speech and gait are normal.  Skin: Skin is warm and dry. No rash noted. No erythema.  Psychiatric: Patient has a normal mood and affect. behavior is normal. Judgment and thought content normal.   Recent Results (from the past 2160 hour(s))  Urinalysis, Routine w reflex microscopic     Status: Abnormal   Collection Time: 03/31/21 12:04 AM  Result Value Ref Range   Color, Urine YELLOW YELLOW   APPearance CLEAR CLEAR   Specific Gravity, Urine 1.025 1.005 - 1.030   pH 5.0 5.0 - 8.0   Glucose, UA NEGATIVE NEGATIVE mg/dL   Hgb urine dipstick NEGATIVE NEGATIVE   Bilirubin Urine NEGATIVE NEGATIVE   Ketones, ur TRACE (A) NEGATIVE mg/dL   Protein, ur NEGATIVE NEGATIVE mg/dL   Nitrite NEGATIVE NEGATIVE   Leukocytes,Ua NEGATIVE NEGATIVE   RBC / HPF 0-5 0 - 5 RBC/hpf   WBC, UA 0-5 0 - 5 WBC/hpf   Bacteria, UA RARE (A) NONE SEEN   Squamous Epithelial / LPF 0-5 0 - 5   Mucus PRESENT     Comment: Performed at [facility], [address] [place], [place] [address]  CBC with Differential     Status: None   Collection  Time: 03/31/21 12:04 AM  Result Value Ref Range   WBC 8.0 4.0 - 10.5 K/uL   RBC 4.68 4.22 - 5.81 MIL/uL   Hemoglobin 15.0 13.0 - 17.0 g/dL   HCT 43.1 39.0 - 52.0 %   MCV 92.1 80.0 - 100.0 fL   MCH 32.1 26.0 - 34.0 pg   MCHC 34.8 30.0 - 36.0 g/dL   RDW 13.1 11.5 - 15.5 %   Platelets 292 150 - 400 K/uL   nRBC 0.0 0.0 - 0.2 %   Neutrophils Relative % 56 %   Neutro Abs 4.5 1.7 - 7.7 K/uL   Lymphocytes Relative 35 %   Lymphs Abs 2.8 0.7 - 4.0 K/uL   Monocytes Relative 8 %   Monocytes Absolute 0.6 0.1 - 1.0 K/uL   Eosinophils Relative 1 %   Eosinophils Absolute 0.1 0.0 - 0.5 K/uL   Basophils Relative 0 %   Basophils Absolute 0.0 0.0 - 0.1 K/uL   Immature Granulocytes 0 %  Abs Immature Granulocytes 0.01 0.00 - 0.07 K/uL    Comment: Performed at [facility], [address] [place], [place] [address]  Comprehensive metabolic panel     Status: Abnormal   Collection Time: 03/31/21 12:04 AM  Result Value Ref Range   Sodium 134 (L) 135 - 145 mmol/L   Potassium 3.8 3.5 - 5.1 mmol/L   Chloride 100 98 - 111 mmol/L   CO2 26 22 - 32 mmol/L   Glucose, Bld 117 (H) 70 - 99 mg/dL    Comment: Glucose reference range applies only to samples taken after fasting for at least 8 hours.   BUN 11 8 - 23 mg/dL   Creatinine, Ser 1.22 0.61 - 1.24 mg/dL   Calcium 9.4 8.9 - 10.3 mg/dL   Total Protein 7.4 6.5 - 8.1 g/dL   Albumin 4.7 3.5 - 5.0 g/dL   AST 23 15 - 41 U/L   ALT 26 0 - 44 U/L   Alkaline Phosphatase 83 38 - 126 U/L   Total Bilirubin 0.7 0.3 - 1.2 mg/dL   GFR, Estimated >60 >60 mL/min    Comment: (NOTE) Calculated using the CKD-EPI Creatinine Equation (2021)    Anion gap 8 5 - 15    Comment: Performed at [facility], [address] [place], [place] [address]  Lipase, blood     Status: None   Collection Time: 03/31/21 12:04 AM  Result Value Ref Range   Lipase 42 11 - 51 U/L    Comment: Performed at [facility], [address] [place], [place] [address]   D-dimer, quantitative     Status: None   Collection Time: 03/31/21  2:57 AM  Result Value Ref Range   D-Dimer, Quant <0.27 0.00 - 0.50 ug/mL-FEU    Comment: (NOTE) At the manufacturer cut-off value of 0.5 g/mL FEU, this assay has a negative predictive value of 95-100%.This assay is intended for use in conjunction with a clinical pretest probability (PTP) assessment model to exclude pulmonary embolism (PE) and deep venous thrombosis (DVT) in outpatients suspected of PE or DVT. Results should be correlated with clinical presentation. Performed at [facility], [place], [place] [address]   Troponin I (High Sensitivity)     Status: None   Collection Time: 03/31/21  2:57 AM  Result Value Ref Range   Troponin I (High Sensitivity) 5 <18 ng/L    Comment: (NOTE) Elevated high sensitivity troponin I (hsTnI) values and significant  changes across serial measurements may suggest ACS but many other  chronic and acute conditions are known to elevate hsTnI results.  Refer to the "Links" section for chest pain algorithms and additional  guidance. Performed at [facility], [address] [place], [place] [address]   Comprehensive metabolic panel     Status: None   Collection Time: 05/13/21  8:11 PM  Result Value Ref Range   Sodium 136 135 - 145 mmol/L   Potassium 4.1 3.5 - 5.1 mmol/L   Chloride 101 98 - 111 mmol/L   CO2 29 22 - 32 mmol/L   Glucose, Bld 99 70 - 99 mg/dL    Comment: Glucose reference range applies only to samples taken after fasting for at least 8 hours.   BUN 11 8 - 23 mg/dL   Creatinine, Ser 0.99 0.61 - 1.24 mg/dL   Calcium 9.9 8.9 - 10.3 mg/dL   Total Protein 8.0 6.5 - 8.1 g/dL   Albumin 4.8 3.5 - 5.0  g/dL   AST 18 15 - 41 U/L   ALT 22 0 - 44 U/L   Alkaline Phosphatase 76 38 - 126 U/L   Total Bilirubin 0.8 0.3 - 1.2 mg/dL   GFR, Estimated >60 >60 mL/min    Comment: (NOTE) Calculated using the CKD-EPI Creatinine Equation (2021)     Anion gap 6 5 - 15    Comment: Performed at [facility], [address] [place], [place] [address]  CBC     Status: None   Collection Time: 05/13/21  8:11 PM  Result Value Ref Range   WBC 8.9 4.0 - 10.5 K/uL   RBC 4.89 4.22 - 5.81 MIL/uL   Hemoglobin 15.5 13.0 - 17.0 g/dL   HCT 45.9 39.0 - 52.0 %   MCV 93.9 80.0 - 100.0 fL   MCH 31.7 26.0 - 34.0 pg   MCHC 33.8 30.0 - 36.0 g/dL   RDW 13.3 11.5 - 15.5 %   Platelets 338 150 - 400 K/uL   nRBC 0.0 0.0 - 0.2 %    Comment: Performed at [facility] [address] [place], [place] [address]  Urinalysis, Routine w reflex microscopic Urine, Clean Catch     Status: Abnormal   Collection Time: 05/13/21  8:11 PM  Result Value Ref Range   Color, Urine YELLOW (A) YELLOW   APPearance CLEAR (A) CLEAR   Specific Gravity, Urine 1.018 1.005 - 1.030   pH 5.0 5.0 - 8.0   Glucose, UA NEGATIVE NEGATIVE mg/dL   Hgb urine dipstick SMALL (A) NEGATIVE   Bilirubin Urine NEGATIVE NEGATIVE   Ketones, ur NEGATIVE NEGATIVE mg/dL   Protein, ur NEGATIVE NEGATIVE mg/dL   Nitrite NEGATIVE NEGATIVE   Leukocytes,Ua NEGATIVE NEGATIVE   RBC / HPF 0-5 0 - 5 RBC/hpf   WBC, UA 0-5 0 - 5 WBC/hpf   Bacteria, UA NONE SEEN NONE SEEN   Squamous Epithelial / LPF 0-5 0 - 5   Mucus PRESENT    Hyaline Casts, UA PRESENT    Ca Oxalate Crys, UA PRESENT     Comment: Performed at [facility], [address] [place], [place] [address]     Fall Risk: Fall Risk  06/09/2021 05/14/2021 04/22/2021 04/06/2021 02/06/2021  Falls in the past year? 0 0 0 0 0  Number falls in past yr: 0 0 0 0 0  Injury with Fall? 0 0 0 0 0  Risk for fall due to : No Fall Risks - No Fall Risks - -  Follow up Falls prevention discussed Falls evaluation completed Falls prevention discussed - -      Functional Status Survey: Is the patient deaf or have difficulty hearing?: No Does the patient have difficulty seeing, even when wearing glasses/contacts?: No Does the  patient have difficulty concentrating, remembering, or making decisions?: Yes Does the patient have difficulty walking or climbing stairs?: No Does the patient have difficulty dressing or bathing?: No Does the patient have difficulty doing errands alone such as visiting a doctor's office or shopping?: No    Assessment & Plan  1. Well adult exam   2. Lower urinary tract symptoms (LUTS)  - PSA - tamsulosin (FLOMAX) 0.4 MG CAPS capsule; Take 1 capsule (0.4 mg total) by mouth daily.  Dispense: 30 capsule; Refill: 3  3. Prostate cancer screening  - PSA  4. Pure hypercholesterolemia  - Lipid panel - rosuvastatin (CRESTOR) 10 MG tablet; Take 1 tablet (10 mg total) by mouth daily.  Dispense:  90 tablet; Refill: 1  5. Atherosclerosis of aorta (HCC)  Discussed statin therapy and he is willing to start   6. Kidney stone on right side  Found on CT   7. Right inguinal hernia  Seeing surgeon  8. Primary osteoarthritis of both hips  Shown on CT abdomen and pelvis but also has groin pain  9. Erectile dysfunction, unspecified erectile dysfunction type  - sildenafil (VIAGRA) 100 MG tablet; Take 0.5-1 tablets (50-100 mg total) by mouth daily as needed for erectile dysfunction.  Dispense: 30 tablet; Refill: 1  10. Colon cancer screening  - Ambulatory referral to Gastroenterology  11. Psychophysiological insomnia    -Prostate cancer screening and PSA options (with potential risks and benefits of testing vs not testing) were discussed along with recent recs/guidelines. -USPSTF grade A and B recommendations reviewed with patient; age-appropriate recommendations, preventive care, screening tests, etc discussed and encouraged; healthy living encouraged; see AVS for patient education given to patient -Discussed importance of 150 minutes of physical activity weekly, eat two servings of fish weekly, eat one serving of tree nuts ( cashews, pistachios, pecans, almonds.Marland Kitchen) every other day, eat  6 servings of fruit/vegetables daily and drink plenty of water and avoid sweet beverages.

## 2021-06-12 ENCOUNTER — Ambulatory Visit: Payer: Self-pay

## 2021-06-12 NOTE — Telephone Encounter (Signed)
Reason for Disposition . [1] Caller has URGENT medicine question about med that PCP or specialist prescribed AND [2] triager unable to answer question  Answer Assessment - Initial Assessment Questions 1. NAME of MEDICATION: "What medicine are you calling about?"     Pt calling in.   The Tamsulonfin  2. QUESTION: "What is your question?" (e.g., double dose of medicine, side effect)     It's keeping me up at night. I'm taking Temazepam.   But I need something stronger for sleep.   I'm not sleeping well.  It's on and off.  I've had 2 bad nights.   The Temazepam  3. PRESCRIBING HCP: "Who prescribed it?" Reason: if prescribed by specialist, call should be referred to that group.     Dr. Ancil Boozer 4. SYMPTOMS: "Do you have any symptoms?"     Not sleeping well.  It's on and off.   I think I'm having some anxiety too.  It's from not sleeping.   5. SEVERITY: If symptoms are present, ask "Are they mild, moderate or severe?"     Severe    The Tamsulonfin is keeping me awake.   I'm not going to take it any more. 6. PREGNANCY:  "Is there any chance that you are pregnant?" "When was your last menstrual period?"     N/A  Protocols used: Medication Question Call-A-AH

## 2021-06-12 NOTE — Telephone Encounter (Signed)
Second attempt to reach pt. Left message to call back. 

## 2021-06-12 NOTE — Telephone Encounter (Signed)
Pt called in c/o not being able to sleep since starting the prostate medication.   He is sleeping on and off and feeling restless.   He also stated he feels like his heart is racing at times too.   His main concern is lack of sleep and is wondering if Dr. Ancil Boozer can prescribe something stronger.  He is having some anxiety but he thinks it's from lack of sleep.  He is feeling very tired and it's hard to get through his day.  He was prescribed Flomax for the prostate problem and is on Restoril for sleep.   He feels the Flomax is hindering his sleep.    I sent his request to [facility].

## 2021-06-12 NOTE — Telephone Encounter (Signed)
Pt called and stated that he was prescribed medicine for his prostate. Pt states that he is not sleeping because of the medication. Pt is requesting an increase of sleeping medication. Please advise     Left message to call back.

## 2021-06-15 ENCOUNTER — Ambulatory Visit: Payer: Self-pay | Admitting: *Deleted

## 2021-06-15 ENCOUNTER — Other Ambulatory Visit: Payer: Self-pay | Admitting: Family Medicine

## 2021-06-15 DIAGNOSIS — F419 Anxiety disorder, unspecified: Secondary | ICD-10-CM

## 2021-06-15 MED ORDER — QUETIAPINE FUMARATE 25 MG PO TABS: 25.0000 mg | ORAL_TABLET | Freq: Every day | ORAL | 0 refills | Status: DC

## 2021-06-15 MED ORDER — ESCITALOPRAM OXALATE 5 MG PO TABS: 5.0000 mg | ORAL_TABLET | Freq: Every morning | ORAL | 0 refills | Status: DC

## 2021-06-15 NOTE — Telephone Encounter (Signed)
Per agent: "Pt would like call back to discuss his medication.  No one called him back Friday. Dr Ancil Boozer has addressed. "  Message from Dr. Ancil Boozer read to pt regarding Lexapro. Pt states he is willing to try Lexapro but is also requesting "Stronger sleep med I can take at night ."  States slept 3 hours last night. States he agrees he needs something for anxiety but would like something stronger at night in addition.  Please advise: CB# [phone]         Reason for Disposition  [1] Caller has NON-URGENT medicine question about med that PCP prescribed AND [2] triager unable to answer question  Answer Assessment - Initial Assessment Questions 1. NAME of MEDICATION: "What medicine are you calling about?"     Stronger sleep med 2. QUESTION: "What is your question?" (e.g., double dose of medicine, side effect)     Something stronger 3. PRESCRIBING HCP: "Who prescribed it?" Reason: if prescribed by specialist, call should be referred to that group.     *No Answer* 4. SYMPTOMS: "Do you have any symptoms?"     *No Answer* 5. SEVERITY: If symptoms are present, ask "Are they mild, moderate or severe?"     Slept 3 hours last night  Protocols used: Medication Question Call-A-AH

## 2021-06-16 NOTE — Telephone Encounter (Signed)
Pt aware and gave verbal understanding. Answered all questions.

## 2021-06-23 ENCOUNTER — Ambulatory Visit: Payer: Self-pay | Admitting: *Deleted

## 2021-06-23 ENCOUNTER — Other Ambulatory Visit: Payer: Self-pay

## 2021-06-23 ENCOUNTER — Other Ambulatory Visit: Payer: Self-pay | Admitting: Family Medicine

## 2021-06-23 DIAGNOSIS — F5104 Psychophysiologic insomnia: Secondary | ICD-10-CM

## 2021-06-23 DIAGNOSIS — G4709 Other insomnia: Secondary | ICD-10-CM

## 2021-06-23 MED ORDER — QUVIVIQ 25 MG PO TABS: 25.0000 mg | ORAL_TABLET | Freq: Every evening | ORAL | 3 refills | Status: DC | PRN

## 2021-06-23 NOTE — Telephone Encounter (Signed)
Spoke with patient, he was unaware that the Lexapro was for his anxiety and he has not picked it up yet from the pharmacy but will do so today. He was also given information on coupon/GoodRx for Quviviq. Patient stated he does not have insurance and will let us know if either medications will be a financial burden so we may assist further or provide resources at that time. He was very appreciative and expressed immense gratitude for our assistance.

## 2021-06-23 NOTE — Telephone Encounter (Signed)
I returned pt's call.   Dr. Ancil Boozer called in an rx for Quviviq.   I'm still having anxiety. Answer Assessment - Initial Assessment Questions 1. CONCERN: "Did anything happen that prompted you to call today?"      Dr. Ancil Boozer ordered Dwana Curd for me today.  I've been having trouble sleeping.  This does not help with anxiety or does it?    Have Katharine Look call me?  Dr. Renard Hamper nurse.  I spoke with her earlier today and knows what this is about.  I let him know I would get this message to Dr. Ancil Boozer' nurse and have her call him bac. 2. ANXIETY SYMPTOMS: "Can you describe how you (your loved one; patient) have been feeling?" (e.g., tense, restless, panicky, anxious, keyed up, overwhelmed, sense of impending doom).      N/A 3. ONSET: "How long have you been feeling this way?" (e.g., hours, days, weeks)     N/A 4. SEVERITY: "How would you rate the level of anxiety?" (e.g., 0 - 10; or mild, moderate, severe).     N/A 5. FUNCTIONAL IMPAIRMENT: "How have these feelings affected your ability to do daily activities?" "Have you had more difficulty than usual doing your normal daily activities?" (e.g., getting better, same, worse; self-care, school, work, interactions)     N/A 6. HISTORY: "Have you felt this way before?" "Have you ever been diagnosed with an anxiety problem in the past?" (e.g., generalized anxiety disorder, panic attacks, PTSD). If Yes, ask: "How was this problem treated?" (e.g., medicines, counseling, etc.)     N/A 7. RISK OF HARM - SUICIDAL IDEATION: "Do you ever have thoughts of hurting or killing yourself?" If Yes, ask:  "Do you have these feelings now?" "Do you have a plan on how you would do this?"     N/A 8. TREATMENT:  "What has been done so far to treat this anxiety?" (e.g., medicines, relaxation strategies). "What has helped?"     N/A 9. TREATMENT - THERAPIST: "Do you have a counselor or therapist? Name?"     N/A 10. POTENTIAL TRIGGERS: "Do you drink caffeinated beverages (e.g., coffee,  colas, teas), and how much daily?" "Do you drink alcohol or use any drugs?" "Have you started any new medicines recently?"     N/A 10. PATIENT SUPPORT: "Who is with you now?" "Who do you live with?" "Do you have family or friends who you can talk to?"        N/A 11. OTHER SYMPTOMS: "Do you have any other symptoms?" (e.g., feeling depressed, trouble concentrating, trouble sleeping, trouble breathing, palpitations or fast heartbeat, chest pain, sweating, nausea, or diarrhea)       N/A 12. PREGNANCY: "Is there any chance you are pregnant?" "When was your last menstrual period?"       N/A  Protocols used: Anxiety and Panic Attack-A-AH

## 2021-06-23 NOTE — Telephone Encounter (Signed)
Pt. Reports he took the Seroquel x 4 nights.Slept between 1-4 hours each night. Stopped taking it. Took the temazepam , but did not sleep consistently well on this either. Had one night that he slept 7 hours on the temazepam. Pt. Is asking about Ambien. Please advise pt.

## 2021-06-23 NOTE — Telephone Encounter (Signed)
I returned pt's call.   He had called in earlier today wanting to talk with a nurse about his sleep but did not provide any more information.   He has some questions about the Quviviq and anxiety.   He is asking to speak with Dr. Ancil Boozer' nurse that he spoke with earlier today.   He thinks her name is Katharine Look.    I let him know I would send his request to the practice and request her nurse call him back.   He thanked me for my help. Notes sent to [facility] for Dr. Ancil Boozer' nurse.

## 2021-06-25 ENCOUNTER — Telehealth: Payer: Self-pay | Admitting: Family Medicine

## 2021-06-25 NOTE — Telephone Encounter (Signed)
Pt states the coupon the cma advised him to print off was rejected at the pharmacy because he does not have commercial insurance.  Pt states the Daridorexant HCl (QUVIVIQ) 25 MG TABS was too expensive to buy out right. He would like call back and advise if there is anything else that can be prescribed instead.

## 2021-06-26 ENCOUNTER — Other Ambulatory Visit: Payer: Self-pay | Admitting: Family Medicine

## 2021-06-26 MED ORDER — TEMAZEPAM 30 MG PO CAPS: 30.0000 mg | ORAL_CAPSULE | Freq: Every evening | ORAL | 2 refills | Status: DC | PRN

## 2021-06-26 NOTE — Telephone Encounter (Signed)
Patient called in to inform Dr Ancil Boozer that he need a medication he can afford. He was informed of Dr Ancil Boozer suggestion to come in and be seen but he stated that he is unemployed and has no money and been waiting since yesterday for a nurse to call him back. Please advise

## 2021-07-07 ENCOUNTER — Telehealth: Payer: Self-pay

## 2021-07-07 NOTE — Telephone Encounter (Signed)
Error

## 2021-07-13 ENCOUNTER — Other Ambulatory Visit: Payer: Self-pay | Admitting: Family Medicine

## 2021-07-13 DIAGNOSIS — F419 Anxiety disorder, unspecified: Secondary | ICD-10-CM

## 2021-07-28 ENCOUNTER — Ambulatory Visit: Payer: Self-pay

## 2021-07-28 NOTE — Telephone Encounter (Signed)
°  Chief Complaint: hernia issues Symptoms: hernia hardness and bulging out Frequency: 4 days Pertinent Negatives: NA Disposition: [] ED /[] Urgent Care (no appt availability in office) / [x] Appointment(In office/virtual)/ []  [place] [facility]/ [] Home Care/ [] Refused Recommended Disposition /[] [facility]/ []  Follow-up with PCP Additional Notes: Pt states he has been having some abdominal cramping and trouble sleeping but he's mainly concerned about his hernia. He states it bulged out couple of days ago and he pushed it back in as he was told to but he states now he's noticed the area is hard and at times has severe pain. Pt denies having pain currently. He is unsure if he needed to follow up with surgeon or office. Advised pt since he has other issues going on he can start with the office and if Dr. Rosana Berger feels is appropriate can refer him to schedule visit with surgeon office. Pt is also wanting to be scheduled for colonoscopy. Pt has OV with Dr. Rosana Berger 07/30/21 at 1040.    Reason for Disposition  [1] MODERATE pain (e.g., interferes with normal activities) AND [2] pain comes and goes (cramps) AND [3] present > 24 hours  (Exception: pain with Vomiting or Diarrhea - see that Guideline)  Answer Assessment - Initial Assessment Questions 1. LOCATION: "Where does it hurt?"      R sided  2. RADIATION: "Does the pain shoot anywhere else?" (e.g., chest, back)     Back and middle of chest  3. ONSET: "When did the pain begin?" (Minutes, hours or days ago)      4 days  5. PATTERN "Does the pain come and go, or is it constant?"    - If constant: "Is it getting better, staying the same, or worsening?"      (Note: Constant means the pain never goes away completely; most serious pain is constant and it progresses)     - If intermittent: "How long does it last?" "Do you have pain now?"     (Note: Intermittent means the pain goes away completely between bouts)     Comes and go 6. SEVERITY:  "How bad is the pain?"  (e.g., Scale 1-10; mild, moderate, or severe)    - MILD (1-3): doesn't interfere with normal activities, abdomen soft and not tender to touch     - MODERATE (4-7): interferes with normal activities or awakens from sleep, abdomen tender to touch     - SEVERE (8-10): excruciating pain, doubled over, unable to do any normal activities       mild 7. RECURRENT SYMPTOM: "Have you ever had this type of stomach pain before?" If Yes, ask: "When was the last time?" and "What happened that time?"      Hernia keeps popping out, groin area  10. OTHER SYMPTOMS: "Do you have any other symptoms?" (e.g., back pain, diarrhea, fever, urination pain, vomiting)       Bowels having issues  Protocols used: Abdominal Pain - Male-A-AH

## 2021-07-30 ENCOUNTER — Encounter: Payer: Self-pay | Admitting: Internal Medicine

## 2021-07-30 ENCOUNTER — Ambulatory Visit (INDEPENDENT_AMBULATORY_CARE_PROVIDER_SITE_OTHER): Payer: Self-pay | Admitting: Internal Medicine

## 2021-07-30 VITALS — BP 132/82 | HR 86 | Temp 98.2°F | Resp 16 | Wt 174.7 lb

## 2021-07-30 DIAGNOSIS — R109 Unspecified abdominal pain: Secondary | ICD-10-CM

## 2021-07-30 DIAGNOSIS — N528 Other male erectile dysfunction: Secondary | ICD-10-CM

## 2021-07-30 DIAGNOSIS — E7800 Pure hypercholesterolemia, unspecified: Secondary | ICD-10-CM

## 2021-07-30 DIAGNOSIS — K4090 Unilateral inguinal hernia, without obstruction or gangrene, not specified as recurrent: Secondary | ICD-10-CM

## 2021-07-30 DIAGNOSIS — Z125 Encounter for screening for malignant neoplasm of prostate: Secondary | ICD-10-CM

## 2021-07-30 DIAGNOSIS — K4091 Unilateral inguinal hernia, without obstruction or gangrene, recurrent: Secondary | ICD-10-CM

## 2021-07-30 DIAGNOSIS — K5900 Constipation, unspecified: Secondary | ICD-10-CM

## 2021-07-30 LAB — POCT URINALYSIS DIPSTICK
Bilirubin, UA: NEGATIVE
Blood, UA: NEGATIVE
Glucose, UA: NEGATIVE
Leukocytes, UA: NEGATIVE
Nitrite, UA: NEGATIVE
Protein, UA: NEGATIVE
Spec Grav, UA: 1.01 (ref 1.010–1.025)
Urobilinogen, UA: 0.2 E.U./dL
pH, UA: 6 (ref 5.0–8.0)

## 2021-07-30 MED ORDER — SILDENAFIL CITRATE 25 MG PO TABS: 25.0000 mg | ORAL_TABLET | Freq: Every day | ORAL | 0 refills | Status: DC | PRN

## 2021-07-30 NOTE — Patient Instructions (Addendum)
It was great seeing you today!  Plan discussed at today's visit: -Ultrasound to further evaluate hernia today -Check urine for infection -Treat constipation with increasing fluids and fiber. Can also use Miralax to have one soft BM a day -Recommend getting colonoscopy as soon as possible   Follow up in: 2 months  Take care and let us know if you have any questions or concerns prior to your next visit.  Dr. Rosana Berger

## 2021-07-30 NOTE — Progress Notes (Signed)
Acute Office Visit  Subjective:    Patient ID: Daniel Garner, male    DOB: [birth year], 67 y.o.   MRN: [id number]  Chief Complaint  Patient presents with   Flank Pain    Side pain w/ abdominal cramping and constipation   Hernia    States ER stated he had hernia, he felt it pop out this week and it was a hard knot and was concerned.  He pushed back in    HPI Patient is in today for abdominal cramping/flank pain, constipation and concern about hernia.   Abdominal Complaint: -Duration: several months -Frequency: intermittent -Nature: aching -Location: RLQ  -Severity: moderate  -Radiation: yes, into right testicle -Alleviating factors: None -Aggravating factors: Constipation seems to make it worse -Treatments attempted: Miralax is helping constipation which makes RLQ pain a little better -Constipation: intermittent -Diarrhea: no -Episodes of diarrhea/day: -Mucous in the stool: no -Heartburn: no -Bloating:yes -Nausea: yes -Vomiting: no -Melena or hematochezia: no -Has noticed a change in stool caliber - thinner and harder to pass. Last colonoscopy in 2017 with recommendations to repeat last year, he does not have insurance at this time and is trying to get some by next month.  -Fever: no -Weight loss: yes about 5 pounds in 2 months -Change in Appetite: yes -Does have some right flank pain, occasional dysuria, no hematuria, increased urinary frequency or urgency  HERNIA: right inguinal hernia repair 9 years ago, left 7 years ago in [place]  Duration: last several months, last week noticed the hernia now feels hard and painful, right testicle more full and sore, especially when laying on back. Able to reduce it, last came out 2 days ago but able to reduce.  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   Prostatism 01/13/2015   Sleep apnea     Past Surgical History:  Procedure Laterality Date   COLONOSCOPY  2000   COLONOSCOPY WITH PROPOFOL N/A 08/06/2015   Procedure: COLONOSCOPY WITH PROPOFOL;  Surgeon: Christene Lye, MD;  Location: [facility] ENDOSCOPY;  Service: Endoscopy;  Laterality: N/A;   HERNIA REPAIR  2007,2012   TONSILLECTOMY      Family History  Problem Relation Age of Onset   Arthritis Mother    Colon cancer Father    Prostate cancer Father    Lung cancer Father     Social History   Socioeconomic History   Marital status: Married    Spouse name: Verl Bangs   Number of children: 3   Years of education: Not on file   Highest education level: Some college, no degree  Occupational History   Occupation: Librarian, academic   Tobacco Use   Smoking status: Never   Smokeless tobacco: Never  Vaping Use   Vaping Use: Never used  Substance and Sexual Activity   Alcohol use: No    Alcohol/week: 0.0 standard drinks   Drug use: No   Sexual activity: Yes    Partners: Female    Birth control/protection: None  Other Topics Concern   Not on file  Social History Narrative   Not on file   Social Determinants of Health   Financial Resource Strain: Low Risk    Difficulty of Paying Living Expenses: Not very hard  Food Insecurity: No Food Insecurity   Worried About  Running Out of Food in the Last Year: Never true   Ran Out of Food in the Last Year: Never true  Transportation Needs: No Transportation Needs   Lack of Transportation (Medical): No   Lack of Transportation (Non-Medical): No  Physical Activity: Insufficiently Active   Days of Exercise per Week: 2 days   Minutes of Exercise per Session: 40 min  Stress: No Stress Concern Present   Feeling of Stress : Only a little  Social Connections: Engineer, building services of Communication with Friends and Family: More than three times a week    Frequency of Social Gatherings with Friends and Family: Once a week   Attends Religious Services: 1 to 4 times per year   Active Member of [employer] or Organizations: Yes   Attends Archivist Meetings: 1 to 4 times per year   Marital Status: Married  Human resources officer Violence: Not At Risk   Fear of Current or Ex-Partner: No   Emotionally Abused: No   Physically Abused: No   Sexually Abused: No    Outpatient Medications Prior to Visit  Medication Sig Dispense Refill   Cholecalciferol (VITAMIN D-3) 1000 units CAPS Take by mouth.     cyanocobalamin 1000 MCG tablet Take 1,000 mcg by mouth daily.     escitalopram (LEXAPRO) 5 MG tablet Take 1 tablet (5 mg total) by mouth in the morning. 30 tablet 0   rosuvastatin (CRESTOR) 10 MG tablet Take 1 tablet (10 mg total) by mouth daily. 90 tablet 1   sildenafil (VIAGRA) 100 MG tablet Take 0.5-1 tablets (50-100 mg total) by mouth daily as needed for erectile dysfunction. 30 tablet 1   tamsulosin (FLOMAX) 0.4 MG CAPS capsule Take 1 capsule (0.4 mg total) by mouth daily. 30 capsule 3   temazepam (RESTORIL) 30 MG capsule Take 1 capsule (30 mg total) by mouth at bedtime as needed for sleep. 30 capsule 2   vitamin C (ASCORBIC ACID) 500 MG tablet Take 500 mg by mouth daily.     Vitamin E 100 units TABS Take by mouth.     No facility-administered medications prior to visit.    No Known Allergies  Review of Systems  Constitutional:  Positive for unexpected weight change. Negative for chills and fever.  Gastrointestinal:  Positive for abdominal pain, constipation and nausea. Negative for blood in stool, diarrhea and vomiting.  Genitourinary:  Positive for dysuria, flank pain and scrotal swelling. Negative for frequency, hematuria, testicular pain and urgency.  Skin: Negative.   Neurological:  Negative for dizziness and headaches.      Objective:    Physical Exam Constitutional:      Appearance: Normal appearance.  HENT:     Head:  Normocephalic and atraumatic.  Eyes:     Conjunctiva/sclera: Conjunctivae normal.  Cardiovascular:     Rate and Rhythm: Normal rate and regular rhythm.  Pulmonary:     Effort: Pulmonary effort is normal.     Breath sounds: Normal breath sounds.  Abdominal:     General: Bowel sounds are normal. There is no distension.     Palpations: Abdomen is soft.     Tenderness: There is no abdominal tenderness. There is no right CVA tenderness, left CVA tenderness, guarding or rebound.     Hernia: A hernia is present.     Comments: Large right hernia present  Genitourinary:    Penis: Normal.      Testes: Normal.  Musculoskeletal:     Right lower leg: No  edema.     Left lower leg: No edema.  Skin:    General: Skin is warm and dry.  Neurological:     General: No focal deficit present.     Mental Status: He is alert. Mental status is at baseline.  Psychiatric:        Mood and Affect: Mood normal.        Behavior: Behavior normal.    BP 132/82    Pulse 86    Temp 98.2 F (36.8 C)    Resp 16    Wt 174 lb 11.2 oz (79.2 kg)    SpO2 98%    BMI 24.37 kg/m  Wt Readings from Last 3 Encounters:  07/30/21 174 lb 11.2 oz (79.2 kg)  06/09/21 179 lb 3.2 oz (81.3 kg)  05/25/21 177 lb 6.4 oz (80.5 kg)    Health Maintenance Due  Topic Date Due   COVID-19 Vaccine (3 - Pfizer risk series) 03/11/2020   COLONOSCOPY (Pts 45-77yrs Insurance coverage will need to be confirmed)  08/05/2020    There are no preventive care reminders to display for this patient.   Lab Results  Component Value Date   TSH 0.56 08/16/2018   Lab Results  Component Value Date   WBC 8.9 05/13/2021   HGB 15.5 05/13/2021   HCT 45.9 05/13/2021   MCV 93.9 05/13/2021   PLT 338 05/13/2021   Lab Results  Component Value Date   NA 136 05/13/2021   K 4.1 05/13/2021   CO2 29 05/13/2021   GLUCOSE 99 05/13/2021   BUN 11 05/13/2021   CREATININE 0.99 05/13/2021   BILITOT 0.8 05/13/2021   ALKPHOS 76 05/13/2021   AST 18  05/13/2021   ALT 22 05/13/2021   PROT 8.0 05/13/2021   ALBUMIN 4.8 05/13/2021   CALCIUM 9.9 05/13/2021   ANIONGAP 6 05/13/2021   Lab Results  Component Value Date   CHOL 214 (H) 10/03/2019   Lab Results  Component Value Date   HDL 64 10/03/2019   Lab Results  Component Value Date   LDLCALC 136 (H) 10/03/2019   Lab Results  Component Value Date   TRIG 52 10/03/2019   Lab Results  Component Value Date   CHOLHDL 3.3 10/03/2019   Lab Results  Component Value Date   HGBA1C 5.5 10/03/2019       Assessment & Plan:   1. Reducible right inguinal hernia: As hernia is becoming more painful and larger an ultrasound will be ordered for further evaluation. If symptoms become severe will present to ER. Follow up in 1-2 months.   - US Abdomen Complete; Future  2. Flank pain: Urine today negative for infection, blood work today.  - POCT Urinalysis Dipstick - Basic Metabolic Panel (BMET)  3. Constipation, unspecified constipation type: Discussed increasing hydration and fiber as well as using Miralax. Also recommend colonoscopy as soon as he has insurance due to symptoms and changes in stool caliber.   4. Other male erectile dysfunction: Viagra refilled.   - sildenafil (VIAGRA) 25 MG tablet; Take 1 tablet (25 mg total) by mouth daily as needed for erectile dysfunction.  Dispense: 10 tablet; Refill: 0   Teodora Medici, DO

## 2021-07-31 ENCOUNTER — Telehealth: Payer: Self-pay | Admitting: Family Medicine

## 2021-07-31 LAB — BASIC METABOLIC PANEL
BUN: 9 mg/dL (ref 7–25)
CO2: 30 mmol/L (ref 20–32)
Calcium: 9.8 mg/dL (ref 8.6–10.3)
Chloride: 102 mmol/L (ref 98–110)
Creat: 1.06 mg/dL (ref 0.70–1.35)
Glucose, Bld: 92 mg/dL (ref 65–99)
Potassium: 4.5 mmol/L (ref 3.5–5.3)
Sodium: 138 mmol/L (ref 135–146)

## 2021-07-31 LAB — LIPID PANEL
Cholesterol: 211 mg/dL — ABNORMAL HIGH (ref <200)
HDL: 55 mg/dL (ref >=40)
LDL Cholesterol (Calc): 140 mg/dL (calc) — ABNORMAL HIGH
Non-HDL Cholesterol (Calc): 156 mg/dL (calc) — ABNORMAL HIGH (ref <130)
Total CHOL/HDL Ratio: 3.8 (calc) (ref <5.0)
Triglycerides: 67 mg/dL (ref <150)

## 2021-07-31 LAB — PSA: PSA: 2.2 ng/mL (ref <=4.00)

## 2021-07-31 MED ORDER — ROSUVASTATIN CALCIUM 20 MG PO TABS: 20.0000 mg | ORAL_TABLET | Freq: Every day | ORAL | 3 refills | Status: DC

## 2021-07-31 NOTE — Addendum Note (Signed)
Addended by: Teodora Medici on: 07/31/2021 09:57 AM   Modules accepted: Orders

## 2021-07-31 NOTE — Telephone Encounter (Signed)
Parker from [facility] called in to get clarification on order, she says will be sending back, hernia right angular changes, is this complete? Please call back.

## 2021-07-31 NOTE — Telephone Encounter (Signed)
Please sign off on new order submitted, per radiologist

## 2021-08-04 ENCOUNTER — Ambulatory Visit: Payer: Self-pay | Admitting: *Deleted

## 2021-08-04 NOTE — Telephone Encounter (Signed)
I returned pt's call.   He has questions regarding a procedure did not give the agent details.  I saw Dr. Rosana Berger last week.   Dr. Ancil Boozer had said something to me about having a colonoscopy last Jan.   It has not been scheduled.   I mentioned it to Dr. Rosana Berger last week.     My problem started 4 weeks ago I couldn't go to the bathroom.   I'm scheduled for an ultrasound.   I missed the colonoscopy so can it be rescheduled?   I'm still having BM issues.    I don't have insurance yet.   I wondering why I haven't had a colonoscopy?     Dr. Rosana Berger ordered the ultrasound.    I let him know he was scheduled for an ultrasound tomorrow for the inguinal hernia.   He didn't realize the ultrasound was for the inguinal hernia.     He thanked me for returning his call and answering his questions.

## 2021-08-05 ENCOUNTER — Ambulatory Visit
Admission: RE | Admit: 2021-08-05 | Discharge: 2021-08-05 | Disposition: A | Discharge status: Home / Self Care | Payer: Self-pay | Source: Ambulatory Visit | Attending: Internal Medicine | Admitting: Internal Medicine

## 2021-08-05 ENCOUNTER — Other Ambulatory Visit: Payer: Self-pay

## 2021-08-05 ENCOUNTER — Ambulatory Visit: Payer: Self-pay

## 2021-08-05 DIAGNOSIS — K4090 Unilateral inguinal hernia, without obstruction or gangrene, not specified as recurrent: Secondary | ICD-10-CM | POA: Insufficient documentation

## 2021-08-06 NOTE — Addendum Note (Signed)
Addended by: Teodora Medici on: 08/06/2021 10:35 AM   Modules accepted: Orders

## 2021-08-10 ENCOUNTER — Ambulatory Visit: Payer: Self-pay | Admitting: *Deleted

## 2021-08-10 NOTE — Telephone Encounter (Signed)
°  Chief Complaint: multiple complaints- chest pain, side pain, constipation, decreased appetite, scrotal pain  Symptoms: triaged chest pain, scrotal pain Frequency: 1 weeks ago Pertinent Negatives: Patient denies heart disease Disposition: [x] ED /[] Urgent Care (no appt availability in office) / [] Appointment(In office/virtual)/ []  [facility] [facility]/ [] Home Care/ [] Refused Recommended Disposition /[] [facility]/ []  Follow-up with PCP Additional Notes: Patient declines ED due to insurance coverage- patient does have scheduled appointment in the office

## 2021-08-10 NOTE — Telephone Encounter (Signed)
Reason for Disposition  [1] Chest pain lasts > 5 minutes AND [2] occurred in past 3 days (72 hours) (Exception: feels exactly the same as previously diagnosed heartburn and has accompanying sour taste in mouth)  [1] Constant pain in scrotum or testicle AND [2] present > 1 hour  Answer Assessment - Initial Assessment Questions 1. LOCATION: "Where does it hurt?"       Midline pain-upper- R side pain 2. RADIATION: "Does the pain go anywhere else?" (e.g., into neck, jaw, arms, back)     Some pain in back 3. ONSET: "When did the chest pain begin?" (Minutes, hours or days)     1 weeks 4. PATTERN "Does the pain come and go, or has it been constant since it started?"  "Does it get worse with exertion?"      Comes and goes- lasting minutes 5. DURATION: "How long does it last" (e.g., seconds, minutes, hours)     minutes 6. SEVERITY: "How bad is the pain?"  (e.g., Scale 1-10; mild, moderate, or severe)    - MILD (1-3): doesn't interfere with normal activities     - MODERATE (4-7): interferes with normal activities or awakens from sleep    - SEVERE (8-10): excruciating pain, unable to do any normal activities       Mild- aggravating more then anything 7. CARDIAC RISK FACTORS: "Do you have any history of heart problems or risk factors for heart disease?" (e.g., angina, prior heart attack; diabetes, high blood pressure, high cholesterol, smoker, or strong family history of heart disease)     High chol 8. PULMONARY RISK FACTORS: "Do you have any history of lung disease?"  (e.g., blood clots in lung, asthma, emphysema, birth control pills)     no 9. CAUSE: "What do you think is causing the chest pain?"     unsure 10. OTHER SYMPTOMS: "Do you have any other symptoms?" (e.g., dizziness, nausea, vomiting, sweating, fever, difficulty breathing, cough)       Nausea- off/on, constipation,  11. PREGNANCY: "Is there any chance you are pregnant?" "When was your last menstrual period?"       *No Answer*  Answer  Assessment - Initial Assessment Questions 1. LOCATION and RADIATION: "Where is the pain located?"      Area behind testicle was on left side- now has sensation when sits 2. QUALITY: "What does the pain feel like?"  (e.g., sharp, dull, aching, burning)     aching 3. SEVERITY: "How bad is the pain?"  (Scale 1-10; or mild, moderate, severe)   - MILD (1-3): doesn't interfere with normal activities    - MODERATE (4-7): interferes with normal activities (e.g., work or school) or awakens from sleep   - SEVERE (8-10): excruciating pain, unable to do any normal activities, difficulty walking     moderate 4. ONSET: "When did the pain start?"     Ongoing- last few weeks worse 5. PATTERN: "Does it come and go, or has it been constant since it started?"     *No Answer* 6. SCROTAL APPEARANCE: "What does the scrotum look like?" "Is there any swelling or redness?"      Tender-in area 7. HERNIA: "Has a doctor ever told you that you have a hernia?"     Hernia present 8. OTHER SYMPTOMS: "Do you have any other symptoms?" (e.g., fever, abdominal pain, vomiting, difficulty passing urine)     Swollen prostate  Protocols used: Chest Pain-A-AH, Scrotal Pain-A-AH

## 2021-08-12 ENCOUNTER — Telehealth: Payer: Self-pay | Admitting: Surgery

## 2021-08-12 ENCOUNTER — Encounter: Payer: Self-pay | Admitting: Surgery

## 2021-08-12 ENCOUNTER — Ambulatory Visit (INDEPENDENT_AMBULATORY_CARE_PROVIDER_SITE_OTHER): Payer: Self-pay | Admitting: Surgery

## 2021-08-12 ENCOUNTER — Other Ambulatory Visit: Payer: Self-pay

## 2021-08-12 VITALS — BP 118/74 | HR 84 | Temp 98.0°F | Ht 71.0 in | Wt 177.0 lb

## 2021-08-12 DIAGNOSIS — K4031 Unilateral inguinal hernia, with obstruction, without gangrene, recurrent: Secondary | ICD-10-CM

## 2021-08-12 DIAGNOSIS — K429 Umbilical hernia without obstruction or gangrene: Secondary | ICD-10-CM

## 2021-08-12 DIAGNOSIS — K4090 Unilateral inguinal hernia, without obstruction or gangrene, not specified as recurrent: Secondary | ICD-10-CM

## 2021-08-12 NOTE — Progress Notes (Signed)
Name: Daniel Garner   MRN: [id number]    DOB: Jul 30, 1955   Date:08/13/2021       Progress Note  Subjective  Chief Complaint  Continued Pain in Chest/Side  HPI  Chest pain: he states over the past week he noticed a burning sensation on sub sternal area, at times pain is sharp and lasts seconds at most one minute. He has noticed more at night. He is not sure if aggravated by meals. Denies diaphoresis or SOB. Denies orthopnea. No rashes on his chest. Sometimes feels a sharp feeling on right upper back.  He has a right inguinal hernia : seen by Dr. Hampton Abbot and scheduled for repair in 10-06-22   Anxiety: he stopped taking  Lexapro because he thinks it caused constipation , currently just taking Temazepam for sleep and seems to help at times. He states since he lost his job March 22 he has been consumed by the though of having cancer. He has seen multiple doctors and continues to have pains in different locations. He worries constantly, past couple of weeks he has noticed lack of interested. Not going to church, doesn't want to get out of bed in the mornings to do his devotionals. Stopped going to the gym. During the visit he cried when he talked about his father dying of lung cancer at age 52 and brother died in his late 2022-10-06 of some form of cancer but not sure of what type.  He is constantly worrying about his health. He asked to see GI sooner and is willing to pay for the visit even though he will have insurance in 2 weeks. Discussed importance of getting his mental health under control with medication and therapy. Explained that he will continue to have pains and symptoms until we can control his anxiety.     Patient Active Problem List   Diagnosis Date Noted   Atherosclerosis of aorta ([place]) 06/09/2021   Lower urinary tract symptoms (LUTS) 06/09/2021   Kidney stone on right side 06/09/2021   Right inguinal hernia 06/09/2021   Constipation 04/06/2021   Osteoarthritis of right hip 05/27/2017    Dyslipidemia 08/22/2016   Chondromalacia of knee, 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   Sleep apnea 08/16/2016   History of migraine 08/16/2016   ED (erectile dysfunction) 08/16/2016   GERD without esophagitis 08/16/2016   Prostatism 01/13/2015    Past Surgical History:  Procedure Laterality Date   COLONOSCOPY  October 06, 1998   COLONOSCOPY WITH PROPOFOL N/A 08/06/2015   Procedure: COLONOSCOPY WITH PROPOFOL;  Surgeon: Christene Lye, MD;  Location: [facility] ENDOSCOPY;  Service: Endoscopy;  Laterality: N/A;   HERNIA REPAIR  10/06/2005   TONSILLECTOMY      Family History  Problem Relation Age of Onset   Arthritis Mother    Colon cancer Father    Prostate cancer Father    Lung cancer Father     Social History   Tobacco Use   Smoking status: Never   Smokeless tobacco: Never  Substance Use Topics   Alcohol use: No    Alcohol/week: 0.0 standard drinks     Current Outpatient Medications:    Cholecalciferol (VITAMIN D-3) 1000 units CAPS, Take by mouth., Disp: , Rfl:    sildenafil (VIAGRA) 25 MG tablet, Take 1 tablet (25 mg total) by mouth daily as needed for erectile dysfunction., Disp: 10 tablet, Rfl: 0   temazepam (RESTORIL) 30 MG capsule, Take 1 capsule (30 mg total) by mouth at bedtime as needed for sleep., Disp: 30 capsule, Rfl: 2  vitamin C (ASCORBIC ACID) 500 MG tablet, Take 500 mg by mouth daily., Disp: , Rfl:    Vitamin E 100 units TABS, Take by mouth., Disp: , Rfl:    Na Sulfate-K Sulfate-Mg Sulf 17.5-3.13-1.6 GM/177ML SOLN, Take 1 kit by mouth once for 1 dose., Disp: 354 mL, Rfl: 0   rosuvastatin (CRESTOR) 20 MG tablet, Take 1 tablet (20 mg total) by mouth daily., Disp: 90 tablet, Rfl: 0  No Known Allergies  I personally reviewed active problem list, medication list, allergies, family history, social history, health maintenance with the patient/caregiver today.   ROS  Constitutional: Negative for fever or weight change.  Respiratory: Negative for cough and shortness of breath.    Cardiovascular: Negative for chest pain or palpitations.  Gastrointestinal: Negative for abdominal pain, no bowel changes.  Musculoskeletal: Negative for gait problem or joint swelling.  Skin: Negative for rash.  Neurological: Negative for dizziness or headache.  No other specific complaints in a complete review of systems (except as listed in HPI above).   Objective  Vitals:   08/13/21 1054  BP: 134/86  Pulse: 90  Resp: 16  SpO2: 99%  Weight: 177 lb (80.3 kg)  Height: _0  (1.803 m)    Body mass index is 24.69 kg/m.  Physical Exam  Constitutional: Patient appears well-developed and well-nourished.  No distress.  HEENT: head atraumatic, normocephalic, pupils equal and reactive to light,  neck supple Cardiovascular: Normal rate, regular rhythm and normal heart sounds.  No murmur heard. No BLE edema. Pulmonary/Chest: Effort normal and breath sounds normal. No respiratory distress. Abdominal: Soft.  There is no tenderness. Psychiatric: Patient cried during the visit, normal speech pattern - speaks slowly and sometimes difficulty describing his thoughts  Recent Results (from the past 2160 hour(s))  Basic Metabolic Panel (BMET)     Status: None   Collection Time: 07/30/21 11:37 AM  Result Value Ref Range   Glucose, Bld 92 65 - 99 mg/dL    Comment: .            Fasting reference interval .    BUN 9 7 - 25 mg/dL   Creat 1.06 0.70 - 1.35 mg/dL   BUN/Creatinine Ratio NOT APPLICABLE 6 - 22 (calc)   Sodium 138 135 - 146 mmol/L   Potassium 4.5 3.5 - 5.3 mmol/L   Chloride 102 98 - 110 mmol/L   CO2 30 20 - 32 mmol/L   Calcium 9.8 8.6 - 10.3 mg/dL  Lipid panel     Status: Abnormal   Collection Time: 07/30/21 11:37 AM  Result Value Ref Range   Cholesterol 211 (H) <200 mg/dL   HDL 55 > OR = 40 mg/dL   Triglycerides 67 <150 mg/dL   LDL Cholesterol (Calc) 140 (H) mg/dL (calc)    Comment: Reference range: <100 . Desirable range <100 mg/dL for primary prevention;   <70 mg/dL  for patients with CHD or diabetic patients  with > or = 2 CHD risk factors. Marland Kitchen LDL-C is now calculated using the Martin-Hopkins  calculation, which is a validated novel method providing  better accuracy than the Friedewald equation in the  estimation of LDL-C.  Cresenciano Genre et al. Annamaria Helling. 6599;357(01): 2061-2068  (http://education.[facility].com/faq/FAQ164)    Total CHOL/HDL Ratio 3.8 <5.0 (calc)   Non-HDL Cholesterol (Calc) 156 (H) <130 mg/dL (calc)    Comment: For patients with diabetes plus 1 major ASCVD risk  factor, treating to a non-HDL-C goal of <100 mg/dL  (LDL-C of <70 mg/dL) is considered  a therapeutic  option.   PSA     Status: None   Collection Time: 07/30/21 11:37 AM  Result Value Ref Range   PSA 2.20 < OR = 4.00 ng/mL    Comment: The total PSA value from this assay system is  standardized against the WHO standard. The test  result will be approximately 20% lower when compared  to the equimolar-standardized total PSA (Beckman  Coulter). Comparison of serial PSA results should be  interpreted with this fact in mind. . This test was performed using the Siemens  chemiluminescent method. Values obtained from  different assay methods cannot be used interchangeably. PSA levels, regardless of value, should not be interpreted as absolute evidence of the presence or absence of disease.   POCT Urinalysis Dipstick     Status: None   Collection Time: 07/30/21 11:41 AM  Result Value Ref Range   Color, UA Yellow    Clarity, UA Cloudy    Glucose, UA Negative Negative   Bilirubin, UA Negative    Ketones, UA small    Spec Grav, UA 1.010 1.010 - 1.025   Blood, UA Negative    pH, UA 6.0 5.0 - 8.0   Protein, UA Negative Negative   Urobilinogen, UA 0.2 0.2 or 1.0 E.U./dL   Nitrite, UA Negative    Leukocytes, UA Negative Negative   Appearance Yellow    Odor Foul     PHQ2/9: Depression screen [facility] 2/9 08/13/2021 07/30/2021 06/09/2021 05/14/2021 04/22/2021  Decreased Interest  3 0 _0 Down, Depressed, Hopeless 2 0 _1 PHQ - 2 Score 5 0 _2 Altered sleeping 2 0 _3 Tired, decreased energy 2 0 1 2 0  Change in appetite 0 0 _4 Feeling bad or failure about yourself  0 0 _5 Trouble concentrating 0 0 0 1 1  Moving slowly or fidgety/restless 0 0 0 0 0  Suicidal thoughts 0 0 0 0 0  PHQ-9 Score 9 0 _6 Difficult doing work/chores Very difficult Not difficult at all Somewhat difficult Somewhat difficult -  Some recent data might be hidden    phq 9 is negative   Fall Risk: Fall Risk  08/13/2021 07/30/2021 06/09/2021 05/14/2021 04/22/2021  Falls in the past year? 0 0 0 0 0  Number falls in past yr: 0 0 0 0 0  Injury with Fall? 0 0 0 0 0  Risk for fall due to : No Fall Risks - No Fall Risks - No Fall Risks  Follow up Falls prevention discussed - Falls prevention discussed Falls evaluation completed Falls prevention discussed      Functional Status Survey: Is the patient deaf or have difficulty hearing?: No Does the patient have difficulty seeing, even when wearing glasses/contacts?: No Does the patient have difficulty concentrating, remembering, or making decisions?: No Does the patient have difficulty walking or climbing stairs?: No Does the patient have difficulty dressing or bathing?: No Does the patient have difficulty doing errands alone such as visiting a doctor's office or shopping?: No    Assessment & Plan  1. GERD without esophagitis  - omeprazole (PRILOSEC) 40 MG capsule; Take 1 capsule (40 mg total) by mouth daily.  Dispense: 90 capsule; Refill: 0  2. Colon cancer screening  - Ambulatory referral to Gastroenterology  3. Pure hypercholesterolemia  - rosuvastatin (CRESTOR) 20 MG tablet; Take 1 tablet (20 mg total) by mouth daily.  Dispense: 90 tablet; Refill: 0  4. Atypical chest pain  Likely from GERD  5. Mild episode of recurrent major depressive disorder (HCC)  - sertraline (ZOLOFT) 50 MG tablet; Take 1 tablet  (50 mg total) by mouth daily.  Dispense: 30 tablet; Refill: 0  6. Anxiety about health  - sertraline (ZOLOFT) 50 MG tablet; Take 1 tablet (50 mg total) by mouth daily.  Dispense: 30 tablet; Refill: 0

## 2021-08-12 NOTE — Progress Notes (Signed)
08/12/2021  History of Present Illness: Daniel Garner is a 66 y.o. male presenting for follow up of a right inguinal hernia.  He was last seen on 05/25/21 for this and was found to have a right inguinal hernia, possible a left inguinal hernia, and possibly an umbilical hernia.  He wanted to wait for a new insurance to get started so he presents today to schedule surgery.  He reports recently since around Christmas, he has noted that the right inguinal hernia has become harder and with some more discomfort.  Denies any enlargement in the size.  He denies any bulging sensation in the left groin, but does still report occasional discomfort.  He also reports new onset of right flank discomfort and right upper back discomfort.  Denies any fevers, chills, chest pain, shortness of breath, nausea, vomiting.    He had a right groin U/S on 08/05/21 which showed a moderate right fat and fluid containing inguinal hernia, but was not reducible.    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 Authorizing Provider  Cholecalciferol (VITAMIN D-3) 1000 units CAPS Take by mouth.   Yes [provider]  cyanocobalamin 1000 MCG tablet Take 1,000 mcg by mouth daily.   Yes [provider]  rosuvastatin (CRESTOR) 20 MG tablet Take 1 tablet (20 mg total) by mouth daily. 07/31/21  Yes Teodora Medici, DO  sildenafil (VIAGRA) 25 MG tablet Take 1  tablet (25 mg total) by mouth daily as needed for erectile dysfunction. 07/30/21  Yes Teodora Medici, DO  temazepam (RESTORIL) 30 MG capsule Take 1 capsule (30 mg total) by mouth at bedtime as needed for sleep. 06/26/21  Yes Sowles, Drue Stager, MD  vitamin C (ASCORBIC ACID) 500 MG tablet Take 500 mg by mouth daily.   Yes [provider]  Vitamin E 100 units TABS Take by mouth.   Yes [provider]    Allergies: No Known Allergies  Review of Systems: Review of Systems  Constitutional:  Negative for chills and fever.  HENT:  Negative for hearing loss.   Respiratory:  Negative for shortness of breath.   Cardiovascular:  Negative for chest pain.  Gastrointestinal:  Positive for abdominal pain. Negative for constipation, diarrhea, nausea and vomiting.  Genitourinary:  Negative for dysuria.  Musculoskeletal:  Positive for back pain. Negative for myalgias.  Skin:  Negative for rash.  Neurological:  Negative for dizziness.  Psychiatric/Behavioral:  Negative for depression.    Physical Exam BP 118/74    Pulse 84    Temp 98 F (36.7 C)    Ht 5\' 11"  (1.803 m)    Wt 177 lb (80.3 kg)    SpO2 97%    BMI 24.69 kg/m  CONSTITUTIONAL: No acute distress, well nourished. HEENT:  Normocephalic, atraumatic, extraocular motion intact. NECK:  Trachea is midline, no jugular venous distention. RESPIRATORY:  Lungs are clear, and breath sounds are equal bilaterally. Normal  respiratory effort without pathologic use of accessory muscles. CARDIOVASCULAR: Heart is regular without murmurs, gallops, or rubs. GI: The abdomen is soft, non-distended, with discomfort to palpation in the right groin when trying to reduce his hernia.  The right groin hernia is soft, though incarcerated, and with some discomfort in trying to reduce.  No true bulging palpable on the left groin.  The patient does have a small umbilical hernia, reducible.  MUSCULOSKELETAL:  Normal gait, no peripheral edema. NEUROLOGIC:  Motor  and sensation is grossly normal.  Cranial nerves are grossly intact. PSYCH:  Alert and oriented to person, place and time. Affect is normal.  Labs/Imaging: U/S Right groin 08/05/21: FINDINGS: Limited grayscale and color Doppler sonography of the right inguinal region with and without Valsalva maneuver demonstrates a moderate right inguinal hernia containing mesenteric fat as well as a small amount of encapsulated fluid similar to that noted on prior CT examination. The hernia appears non reducible on this examination.   IMPRESSION: Moderate right fat and fluid containing inguinal hernia.   Labs from 07/30/21: Na 138, K 4.5, Cl 102, CO2 30, BUN 9, Cr 1.06.     Assessment and Plan: This is a 66 y.o. male with a now incarcerated right inguinal hernia, a possible left inguinal hernia, and a reducible umbilical hernia.  --Discussed with the patient the findings on today's exam.  His right inguinal hernia is now incarcerated and he's having discomfort when trying to reduce it.  He reports feeling it harder, but on my exam it is soft, without any skin induration or changes to suggest strangulation.  He does have a reducible small umbilical hernia, and potentially a left inguinal hernia.  Discussed with him that given the changes, we should definitely schedule surgery to prevent further worsening or progression.  His new insurance will start on February 1st.   --Discussed with him the plan for a robotic assisted right inguinal hernia repair, possible bilateral, and possible open umbilical hernia repair.  Reviewed the surgery at length with him including the risks of bleeding, infection, injury to surrounding structures, that this is an outpatient procedure, post-operative activity restrictions, pain control, and he's willing to proceed. --Will schedule him for 09/03/21.  All questions have been answered.  I spent 40 minutes dedicated to the care of this patient on the date of this encounter to include  pre-visit review of records, face-to-face time with the patient discussing diagnosis and management, and any post-visit coordination of care.   Melvyn Neth, [facility]

## 2021-08-12 NOTE — H&P (View-Only) (Signed)
08/12/2021  History of Present Illness: Daniel Garner is a 66 y.o. male presenting for follow up of a right inguinal hernia.  He was last seen on 05/25/21 for this and was found to have a right inguinal hernia, possible a left inguinal hernia, and possibly an umbilical hernia.  He wanted to wait for a new insurance to get started so he presents today to schedule surgery.  He reports recently since around Christmas, he has noted that the right inguinal hernia has become harder and with some more discomfort.  Denies any enlargement in the size.  He denies any bulging sensation in the left groin, but does still report occasional discomfort.  He also reports new onset of right flank discomfort and right upper back discomfort.  Denies any fevers, chills, chest pain, shortness of breath, nausea, vomiting.    He had a right groin U/S on 08/05/21 which showed a moderate right fat and fluid containing inguinal hernia, but was not reducible.    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 Authorizing Provider  Cholecalciferol (VITAMIN D-3) 1000 units CAPS Take by mouth.   Yes [provider]  cyanocobalamin 1000 MCG tablet Take 1,000 mcg by mouth daily.   Yes [provider]  rosuvastatin (CRESTOR) 20 MG tablet Take 1 tablet (20 mg total) by mouth daily. 07/31/21  Yes Teodora Medici, DO  sildenafil (VIAGRA) 25 MG tablet Take 1  tablet (25 mg total) by mouth daily as needed for erectile dysfunction. 07/30/21  Yes Teodora Medici, DO  temazepam (RESTORIL) 30 MG capsule Take 1 capsule (30 mg total) by mouth at bedtime as needed for sleep. 06/26/21  Yes Sowles, Drue Stager, MD  vitamin C (ASCORBIC ACID) 500 MG tablet Take 500 mg by mouth daily.   Yes [provider]  Vitamin E 100 units TABS Take by mouth.   Yes [provider]    Allergies: No Known Allergies  Review of Systems: Review of Systems  Constitutional:  Negative for chills and fever.  HENT:  Negative for hearing loss.   Respiratory:  Negative for shortness of breath.   Cardiovascular:  Negative for chest pain.  Gastrointestinal:  Positive for abdominal pain. Negative for constipation, diarrhea, nausea and vomiting.  Genitourinary:  Negative for dysuria.  Musculoskeletal:  Positive for back pain. Negative for myalgias.  Skin:  Negative for rash.  Neurological:  Negative for dizziness.  Psychiatric/Behavioral:  Negative for depression.    Physical Exam BP 118/74    Pulse 84    Temp 98 F (36.7 C)    Ht 5\' 11"  (1.803 m)    Wt 177 lb (80.3 kg)    SpO2 97%    BMI 24.69 kg/m  CONSTITUTIONAL: No acute distress, well nourished. HEENT:  Normocephalic, atraumatic, extraocular motion intact. NECK:  Trachea is midline, no jugular venous distention. RESPIRATORY:  Lungs are clear, and breath sounds are equal bilaterally. Normal  respiratory effort without pathologic use of accessory muscles. CARDIOVASCULAR: Heart is regular without murmurs, gallops, or rubs. GI: The abdomen is soft, non-distended, with discomfort to palpation in the right groin when trying to reduce his hernia.  The right groin hernia is soft, though incarcerated, and with some discomfort in trying to reduce.  No true bulging palpable on the left groin.  The patient does have a small umbilical hernia, reducible.  MUSCULOSKELETAL:  Normal gait, no peripheral edema. NEUROLOGIC:  Motor  and sensation is grossly normal.  Cranial nerves are grossly intact. PSYCH:  Alert and oriented to person, place and time. Affect is normal.  Labs/Imaging: U/S Right groin 08/05/21: FINDINGS: Limited grayscale and color Doppler sonography of the right inguinal region with and without Valsalva maneuver demonstrates a moderate right inguinal hernia containing mesenteric fat as well as a small amount of encapsulated fluid similar to that noted on prior CT examination. The hernia appears non reducible on this examination.   IMPRESSION: Moderate right fat and fluid containing inguinal hernia.   Labs from 07/30/21: Na 138, K 4.5, Cl 102, CO2 30, BUN 9, Cr 1.06.     Assessment and Plan: This is a 66 y.o. male with a now incarcerated right inguinal hernia, a possible left inguinal hernia, and a reducible umbilical hernia.  --Discussed with the patient the findings on today's exam.  His right inguinal hernia is now incarcerated and he's having discomfort when trying to reduce it.  He reports feeling it harder, but on my exam it is soft, without any skin induration or changes to suggest strangulation.  He does have a reducible small umbilical hernia, and potentially a left inguinal hernia.  Discussed with him that given the changes, we should definitely schedule surgery to prevent further worsening or progression.  His new insurance will start on February 1st.   --Discussed with him the plan for a robotic assisted right inguinal hernia repair, possible bilateral, and possible open umbilical hernia repair.  Reviewed the surgery at length with him including the risks of bleeding, infection, injury to surrounding structures, that this is an outpatient procedure, post-operative activity restrictions, pain control, and he's willing to proceed. --Will schedule him for 09/03/21.  All questions have been answered.  I spent 40 minutes dedicated to the care of this patient on the date of this encounter to include  pre-visit review of records, face-to-face time with the patient discussing diagnosis and management, and any post-visit coordination of care.   Melvyn Neth, [facility]

## 2021-08-12 NOTE — Patient Instructions (Addendum)
You have chose to have your hernia repaired. This will be done by Dr. Hampton Abbot at [facility]. We are looking at 09/02/21 to get this done.   We recommend you take stool softeners and Miralax to prevent any constipation before and especially after surgery.   Please see your (blue) Pre-care information that you have been given today. Our surgery scheduler will call you to look at surgery dates and to go over information.   You will need to arrange to be out of work for 2 weeks and then return with a lifting restrictions for 4 more weeks. Please send any FMLA paperwork prior to surgery and we will fill this out and fax it back to your employer within 3 business days.  You may have a bruise in your groin and also swelling and brusing in your testicle area. You may use ice 4-5 times daily for 15-20 minutes each time. Make sure that you place a barrier between you and the ice pack. To decrease the swelling, you may roll up a bath towel and place it vertically in between your thighs with your testicles resting on the towel. You will want to keep this area elevated as much as possible for several days following surgery.    Inguinal Hernia, Adult Muscles help keep everything in the body in its proper place. But if a weak spot in the muscles develops, something can poke through. That is called a hernia. When this happens in the lower part of the belly (abdomen), it is called an inguinal hernia. (It takes its name from a part of the body in this region called the inguinal canal.) A weak spot in the wall of muscles lets some fat or part of the small intestine bulge through. An inguinal hernia can develop at any age. Men get them more often than women. CAUSES  In adults, an inguinal hernia develops over time. It can be triggered by: Suddenly straining the muscles of the lower abdomen. Lifting heavy objects. Straining to have a bowel movement. Difficult bowel movements (constipation) can lead to this. Constant  coughing. This may be caused by smoking or lung disease. Being overweight. Being pregnant. Working at a job that requires long periods of standing or heavy lifting. Having had an inguinal hernia before. One type can be an emergency situation. It is called a strangulated inguinal hernia. It develops if part of the small intestine slips through the weak spot and cannot get back into the abdomen. The blood supply can be cut off. If that happens, part of the intestine may die. This situation requires emergency surgery. SYMPTOMS  Often, a small inguinal hernia has no symptoms. It is found when a healthcare provider does a physical exam. Larger hernias usually have symptoms.  In adults, symptoms may include: A lump in the groin. This is easier to see when the person is standing. It might disappear when lying down. In men, a lump in the scrotum. Pain or burning in the groin. This occurs especially when lifting, straining or coughing. A dull ache or feeling of pressure in the groin. Signs of a strangulated hernia can include: A bulge in the groin that becomes very painful and tender to the touch. A bulge that turns red or purple. Fever, nausea and vomiting. Inability to have a bowel movement or to pass gas. DIAGNOSIS  To decide if you have an inguinal hernia, a healthcare provider will probably do a physical examination. This will include asking questions about any symptoms you have  noticed. The healthcare provider might feel the groin area and ask you to cough. If an inguinal hernia is felt, the healthcare provider may try to slide it back into the abdomen. Usually no other tests are needed. TREATMENT  Treatments can vary. The size of the hernia makes a difference. Options include: Watchful waiting. This is often suggested if the hernia is small and you have had no symptoms. No medical procedure will be done unless symptoms develop. You will need to watch closely for symptoms. If any occur,  contact your healthcare provider right away. Surgery. This is used if the hernia is larger or you have symptoms. Open surgery. This is usually an outpatient procedure (you will not stay overnight in a hospital). An cut (incision) is made through the skin in the groin. The hernia is put back inside the abdomen. The weak area in the muscles is then repaired by herniorrhaphy or hernioplasty. Herniorrhaphy: in this type of surgery, the weak muscles are sewn back together. Hernioplasty: a patch or mesh is used to close the weak area in the abdominal wall. Laparoscopy. In this procedure, a surgeon makes small incisions. A thin tube with a tiny video camera (called a laparoscope) is put into the abdomen. The surgeon repairs the hernia with mesh by looking with the video camera and using two long instruments. HOME CARE INSTRUCTIONS  After surgery to repair an inguinal hernia: You will need to take pain medicine prescribed by your healthcare provider. Follow all directions carefully. You will need to take care of the wound from the incision. Your activity will be restricted for awhile. This will probably include no heavy lifting for several weeks. You also should not do anything too active for a few weeks. When you can return to work will depend on the type of job that you have. During "watchful waiting" periods, you should: Maintain a healthy weight. Eat a diet high in fiber (fruits, vegetables and whole grains). Drink plenty of fluids to avoid constipation. This means drinking enough water and other liquids to keep your urine clear or pale yellow. Do not lift heavy objects. Do not stand for long periods of time. Quit smoking. This should keep you from developing a frequent cough. SEEK MEDICAL CARE IF:  A bulge develops in your groin area. You feel pain, a burning sensation or pressure in the groin. This might be worse if you are lifting or straining. You develop a fever of more than 100.5 F (38.1  C). SEEK IMMEDIATE MEDICAL CARE IF:  Pain in the groin increases suddenly. A bulge in the groin gets bigger suddenly and does not go down. For men, there is sudden pain in the scrotum. Or, the size of the scrotum increases. A bulge in the groin area becomes red or purple and is painful to touch. You have nausea or vomiting that does not go away. You feel your heart beating much faster than normal. You cannot have a bowel movement or pass gas. You develop a fever of more than 102.0 F (38.9 C).   This information is not intended to replace advice given to you by your health care provider. Make sure you discuss any questions you have with your health care provider.   Document Released: 11/28/2008 Document Revised: 10/04/2011 Document Reviewed: 01/13/2015 Elsevier Interactive Patient Education [employer].

## 2021-08-12 NOTE — Telephone Encounter (Signed)
Left message for patient to call me to discuss another date other than 09/02/21 for surgery.

## 2021-08-13 ENCOUNTER — Telehealth: Payer: Self-pay

## 2021-08-13 ENCOUNTER — Ambulatory Visit: Payer: Self-pay | Admitting: Family Medicine

## 2021-08-13 ENCOUNTER — Other Ambulatory Visit: Payer: Self-pay

## 2021-08-13 ENCOUNTER — Encounter: Payer: Self-pay | Admitting: Family Medicine

## 2021-08-13 ENCOUNTER — Telehealth: Payer: Self-pay | Admitting: Surgery

## 2021-08-13 VITALS — BP 134/86 | HR 90 | Resp 16 | Ht 71.0 in | Wt 177.0 lb

## 2021-08-13 DIAGNOSIS — Z1211 Encounter for screening for malignant neoplasm of colon: Secondary | ICD-10-CM

## 2021-08-13 DIAGNOSIS — R0789 Other chest pain: Secondary | ICD-10-CM

## 2021-08-13 DIAGNOSIS — F418 Other specified anxiety disorders: Secondary | ICD-10-CM

## 2021-08-13 DIAGNOSIS — K219 Gastro-esophageal reflux disease without esophagitis: Secondary | ICD-10-CM

## 2021-08-13 DIAGNOSIS — F330 Major depressive disorder, recurrent, mild: Secondary | ICD-10-CM

## 2021-08-13 DIAGNOSIS — E7800 Pure hypercholesterolemia, unspecified: Secondary | ICD-10-CM

## 2021-08-13 MED ORDER — OMEPRAZOLE 40 MG PO CPDR: 40.0000 mg | DELAYED_RELEASE_CAPSULE | Freq: Every day | ORAL | 0 refills | Status: DC

## 2021-08-13 MED ORDER — SERTRALINE HCL 50 MG PO TABS: 50.0000 mg | ORAL_TABLET | Freq: Every day | ORAL | 3 refills | Status: DC

## 2021-08-13 MED ORDER — ROSUVASTATIN CALCIUM 20 MG PO TABS
20.0000 mg | ORAL_TABLET | Freq: Every day | ORAL | 0 refills | Status: DC
Start: 2021-08-13 — End: 2021-08-26

## 2021-08-13 MED ORDER — NA SULFATE-K SULFATE-MG SULF 17.5-3.13-1.6 GM/177ML PO SOLN: 1.0000 | Freq: Once | ORAL | 0 refills | Status: AC

## 2021-08-13 MED ORDER — SERTRALINE HCL 50 MG PO TABS: 50.0000 mg | ORAL_TABLET | Freq: Every day | ORAL | 0 refills | Status: DC

## 2021-08-13 NOTE — Patient Instructions (Addendum)

## 2021-08-13 NOTE — Telephone Encounter (Signed)
CALLED PATIENT NO ANSWER LEFT VOICEMAIL FOR A CALL BACK To schedule procedure

## 2021-08-13 NOTE — Telephone Encounter (Signed)
Patient has been advised of Pre-Admission date/time, COVID Testing date and Surgery date.  Surgery Date: 09/03/21 Preadmission Testing Date: 08/26/21 (phone 8a-1p) Covid Testing Date: Not  needed.     Patient has been made aware to call [phone], between 1-3:00pm the day before surgery, to find out what time to arrive for surgery.

## 2021-08-13 NOTE — Progress Notes (Unsigned)
Gastroenterology Pre-Procedure Review  Request Date: 09/01/2021 Requesting Physician: Dr. Vicente Males   PATIENT REVIEW QUESTIONS: The patient responded to the following health history questions as indicated:    1. Are you having any GI issues? no 2. Do you have a personal history of Polyps? yes (last colonoscopy) 3. Do you have a family history of Colon Cancer or Polyps? yes (colon cancer dad) 4. Diabetes Mellitus? no 5. Joint replacements in the past 12 months?no 6. Major health problems in the past 3 months?yes (feeling discomfort in back and have hernia ) 7. Any artificial heart valves, MVP, or defibrillator?no    MEDICATIONS & ALLERGIES:    Patient reports the following regarding taking any anticoagulation/antiplatelet therapy:   Plavix, Coumadin, Eliquis, Xarelto, Lovenox, Pradaxa, Brilinta, or Effient? no Aspirin? no  Patient confirms/reports the following medications:  Current Outpatient Medications  Medication Sig Dispense Refill   Cholecalciferol (VITAMIN D-3) 1000 units CAPS Take by mouth.     omeprazole (PRILOSEC) 40 MG capsule Take 1 capsule (40 mg total) by mouth daily. 90 capsule 0   rosuvastatin (CRESTOR) 20 MG tablet Take 1 tablet (20 mg total) by mouth daily. 90 tablet 0   sertraline (ZOLOFT) 50 MG tablet Take 1 tablet (50 mg total) by mouth daily. 30 tablet 3   sildenafil (VIAGRA) 25 MG tablet Take 1 tablet (25 mg total) by mouth daily as needed for erectile dysfunction. 10 tablet 0   temazepam (RESTORIL) 30 MG capsule Take 1 capsule (30 mg total) by mouth at bedtime as needed for sleep. 30 capsule 2   vitamin C (ASCORBIC ACID) 500 MG tablet Take 500 mg by mouth daily.     Vitamin E 100 units TABS Take by mouth.     No current facility-administered medications for this visit.    Patient confirms/reports the following allergies:  No Known Allergies  No orders of the defined types were placed in this encounter.   AUTHORIZATION INFORMATION Primary  Insurance: 1D#: Group #:  Secondary Insurance: 1D#: Group #:  SCHEDULE INFORMATION: Date: 09/01/2021 Time: Location:[facility]

## 2021-08-17 ENCOUNTER — Ambulatory Visit: Payer: Self-pay | Admitting: Surgery

## 2021-08-26 ENCOUNTER — Other Ambulatory Visit: Payer: Self-pay

## 2021-08-26 ENCOUNTER — Encounter
Admission: RE | Admit: 2021-08-26 | Discharge: 2021-08-26 | Disposition: A | Discharge status: Home / Self Care | Payer: 59 | Source: Ambulatory Visit | Attending: Surgery | Admitting: Surgery

## 2021-08-26 HISTORY — DX: Gastro-esophageal reflux disease without esophagitis: K21.9

## 2021-08-26 NOTE — Patient Instructions (Signed)
Your procedure is scheduled on: 09/03/21 Report to [facility]. To find out your arrival time please call [phone] between 1PM - 3PM on 09/02/21.  Remember: Instructions that are not followed completely may result in serious medical risk, up to and including death, or upon the discretion of your surgeon and anesthesiologist your surgery may need to be rescheduled.     _X__ 1. Do not eat food after midnight the night before your procedure.                 No gum chewing or hard candies. You may drink clear liquids up to 2 hours                 before you are scheduled to arrive for your surgery- DO not drink clear                 liquids within 2 hours of the start of your surgery.                 Clear Liquids include:  water, apple juice without pulp, clear carbohydrate                 drink such as Clearfast or Gatorade, Black Coffee or Tea (Do not add                 anything to coffee or tea). Diabetics water only  __X__2.  On the morning of surgery brush your teeth with toothpaste and water, you                 may rinse your mouth with mouthwash if you wish.  Do not swallow any              toothpaste of mouthwash.     _X__ 3.  No Alcohol for 24 hours before or after surgery.   _X__ 4.  Do Not Smoke or use e-cigarettes For 24 Hours Prior to Your Surgery.                 Do not use any chewable tobacco products for at least 6 hours prior to                 surgery.  ____  5.  Bring all medications with you on the day of surgery if instructed.   __X__  6.  Notify your doctor if there is any change in your medical condition      (cold, fever, infections).     Do not wear jewelry, make-up, hairpins, clips or nail polish. Do not wear lotions, powders, or perfumes.  Do not shave 48 hours prior to surgery. Men may shave face and neck. Do not bring valuables to the hospital.    [facility] is not responsible for any belongings or  valuables.  Contacts, dentures/partials or body piercings may not be worn into surgery. Bring a case for your contacts, glasses or hearing aids, a denture cup will be supplied. Leave your suitcase in the car. After surgery it may be brought to your room. For patients admitted to the hospital, discharge time is determined by your treatment team.   Patients discharged the day of surgery will not be allowed to drive home.   Please read over the following fact sheets that you were given:     __X__ Take these medicines the morning of surgery with A SIP OF WATER:  1. omeprazole (PRILOSEC) 40 MG capsule  2. rosuvastatin (CRESTOR) 10 MG tablet  3.   4.  5.  6.  ____ Fleet Enema (as directed)   __X__ Use CHG Soap/SAGE wipes as directed  MAY USE DIAL SOAP OR COME TO OFFICE TO PICK UP CHG  ____ Use inhalers on the day of surgery  ____ Stop metformin/Janumet/Farxiga 2 days prior to surgery    ____ Take 1/2 of usual insulin dose the night before surgery. No insulin the morning          of surgery.   ____ Stop Blood Thinners Coumadin/Plavix/Xarelto/Pleta/Pradaxa/Eliquis/Effient/Aspirin  on   Or contact your Surgeon, Cardiologist or Medical Doctor regarding  ability to stop your blood thinners  __X__ Stop Anti-inflammatories 7 days before surgery such as Advil, Ibuprofen, Motrin,  BC or Goodies Powder, Naprosyn, Naproxen, Aleve, Aspirin    __X__ Stop all herbals and supplements, fish oil or vitamins  until after surgery.    ____ Bring C-Pap to the hospital.

## 2021-09-01 ENCOUNTER — Encounter: Payer: Self-pay | Admitting: Anesthesiology

## 2021-09-01 ENCOUNTER — Ambulatory Visit
Admission: RE | Admit: 2021-09-01 | Discharge: 2021-09-01 | Disposition: A | Discharge status: Home / Self Care | Payer: 59 | Source: Ambulatory Visit | Attending: Gastroenterology | Admitting: Gastroenterology

## 2021-09-01 ENCOUNTER — Telehealth: Payer: Self-pay

## 2021-09-01 ENCOUNTER — Encounter
Admission: RE | Disposition: A | Discharge status: Home / Self Care | Payer: Self-pay | Source: Ambulatory Visit | Attending: Gastroenterology

## 2021-09-01 DIAGNOSIS — Z1211 Encounter for screening for malignant neoplasm of colon: Secondary | ICD-10-CM

## 2021-09-01 SURGERY — COLONOSCOPY WITH PROPOFOL
Anesthesia: General

## 2021-09-01 MED ORDER — PEG 3350-KCL-NA BICARB-NACL 420 G PO SOLR: ORAL | 0 refills | Status: DC

## 2021-09-01 MED ORDER — SODIUM CHLORIDE 0.9 % IV SOLN: INTRAVENOUS | Status: DC

## 2021-09-01 NOTE — Telephone Encounter (Signed)
Dr. Vicente Males sent me a message letting me know that the patient already knows to come in tomorrow at 10 AM but to go ahead and send him a Golytely prep to his pharmacy. I called the patient back and left him another message letting him know that he is already set for tomorrow and that I would be sending the prescription of his prep to his pharmacy.

## 2021-09-01 NOTE — Telephone Encounter (Signed)
Called patient and left him a voicemail to call me back if he agreed to have his colonoscopy tomorrow or reschedule another date.

## 2021-09-02 ENCOUNTER — Ambulatory Visit
Admission: RE | Admit: 2021-09-02 | Discharge: 2021-09-02 | Disposition: A | Discharge status: Home / Self Care | Payer: 59 | Attending: Gastroenterology | Admitting: Gastroenterology

## 2021-09-02 ENCOUNTER — Ambulatory Visit: Payer: 59 | Admitting: Anesthesiology

## 2021-09-02 ENCOUNTER — Encounter
Admission: RE | Disposition: A | Discharge status: Home / Self Care | Payer: Self-pay | Source: Home / Self Care | Attending: Gastroenterology

## 2021-09-02 ENCOUNTER — Encounter: Payer: Self-pay | Admitting: Gastroenterology

## 2021-09-02 ENCOUNTER — Other Ambulatory Visit: Payer: Self-pay

## 2021-09-02 DIAGNOSIS — K635 Polyp of colon: Secondary | ICD-10-CM | POA: Insufficient documentation

## 2021-09-02 DIAGNOSIS — G4730 Sleep apnea, unspecified: Secondary | ICD-10-CM | POA: Insufficient documentation

## 2021-09-02 DIAGNOSIS — K219 Gastro-esophageal reflux disease without esophagitis: Secondary | ICD-10-CM | POA: Insufficient documentation

## 2021-09-02 DIAGNOSIS — D120 Benign neoplasm of cecum: Secondary | ICD-10-CM | POA: Insufficient documentation

## 2021-09-02 DIAGNOSIS — Z8616 Personal history of COVID-19: Secondary | ICD-10-CM | POA: Diagnosis not present

## 2021-09-02 DIAGNOSIS — Z1211 Encounter for screening for malignant neoplasm of colon: Secondary | ICD-10-CM | POA: Insufficient documentation

## 2021-09-02 HISTORY — PX: COLONOSCOPY WITH PROPOFOL: SHX5780

## 2021-09-02 SURGERY — COLONOSCOPY WITH PROPOFOL
Anesthesia: General

## 2021-09-02 MED ORDER — PROPOFOL 10 MG/ML IV BOLUS
INTRAVENOUS | Status: AC
  Filled 2021-09-02: qty 40

## 2021-09-02 MED ORDER — SODIUM CHLORIDE 0.9 % IV SOLN: INTRAVENOUS | Status: DC

## 2021-09-02 MED ORDER — STERILE WATER FOR IRRIGATION IR SOLN
Status: DC | PRN
  Administered 2021-09-02 (×4): 60 mL

## 2021-09-02 MED ORDER — PROPOFOL 10 MG/ML IV BOLUS
INTRAVENOUS | Status: DC | PRN
  Administered 2021-09-02: 100 mg via INTRAVENOUS

## 2021-09-02 MED ORDER — PROPOFOL 500 MG/50ML IV EMUL
INTRAVENOUS | Status: DC | PRN
  Administered 2021-09-02: 175 ug/kg/min via INTRAVENOUS

## 2021-09-02 NOTE — Anesthesia Preprocedure Evaluation (Signed)
Anesthesia Evaluation  Patient identified by MRN, date of birth, ID band Patient awake    Reviewed: Allergy & Precautions, NPO status , Patient's Chart, lab work & pertinent test results  History of Anesthesia Complications Negative for: history of anesthetic complications  Airway Mallampati: III  TM Distance: >3 FB Neck ROM: full    Dental  (+) Chipped, Poor Dentition, Missing   Pulmonary neg shortness of breath, sleep apnea ,    Pulmonary exam normal        Cardiovascular Exercise Tolerance: Good (-) angina(-) Past MI negative cardio ROS Normal cardiovascular exam     Neuro/Psych negative neurological ROS  negative psych ROS   GI/Hepatic Neg liver ROS, GERD  Controlled,  Endo/Other  negative endocrine ROS  Renal/GU Renal disease  negative genitourinary   Musculoskeletal  (+) Arthritis ,   Abdominal   Peds  Hematology negative hematology ROS (+)   Anesthesia Other Findings Past Medical History: No date: Arthritis 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 2020: COVID No date: GERD (gastroesophageal reflux disease) 01/13/2015: Prostatism No date: Sleep apnea  Past Surgical History: 2000: COLONOSCOPY 08/06/2015: COLONOSCOPY WITH PROPOFOL; N/A     Comment:  Procedure: COLONOSCOPY WITH PROPOFOL;  Surgeon:               Christene Lye, MD;  Location: [facility] ENDOSCOPY;                Service: Endoscopy;  Laterality: N/A; [id number]: HERNIA REPAIR No date: TONSILLECTOMY  BMI    Body Mass Index: 24.69 kg/m      Reproductive/Obstetrics negative OB ROS                             Anesthesia Physical Anesthesia Plan  ASA: 3  Anesthesia Plan: General   Post-op Pain Management:    Induction: Intravenous  PONV Risk Score and Plan: Propofol infusion and TIVA  Airway Management Planned: Natural Airway and Nasal Cannula  Additional Equipment:    Intra-op Plan:   Post-operative Plan:   Informed Consent: I have reviewed the patients History and Physical, chart, labs and discussed the procedure including the risks, benefits and alternatives for the proposed anesthesia with the patient or authorized representative who has indicated his/her understanding and acceptance.     Dental Advisory Given  Plan Discussed with: Anesthesiologist, CRNA and Surgeon  Anesthesia Plan Comments: (Patient consented for risks of anesthesia including but not limited to:  - adverse reactions to medications - risk of airway placement if required - damage to eyes, teeth, lips or other oral mucosa - nerve damage due to positioning  - sore throat or hoarseness - Damage to heart, brain, nerves, lungs, other parts of body or loss of life  Patient voiced understanding.)        Anesthesia Quick Evaluation

## 2021-09-02 NOTE — Anesthesia Postprocedure Evaluation (Signed)
Anesthesia Post Note  Patient: Daniel Garner  Procedure(s) Performed: COLONOSCOPY WITH PROPOFOL  Patient location during evaluation: Endoscopy Anesthesia Type: General Level of consciousness: awake and alert Pain management: pain level controlled Vital Signs Assessment: post-procedure vital signs reviewed and stable Respiratory status: spontaneous breathing, nonlabored ventilation, respiratory function stable and patient connected to nasal cannula oxygen Cardiovascular status: blood pressure returned to baseline and stable Postop Assessment: no apparent nausea or vomiting Anesthetic complications: no   No notable events documented.   Last Vitals:  Vitals:   09/02/21 1220 09/02/21 1230  BP: 121/82 (!) 128/96  Pulse: 70 69  Resp: 19 18  Temp: 36.6 C   SpO2: 100% 100%    Last Pain:  Vitals:   09/02/21 1230  TempSrc:   PainSc: 0-No pain                 Precious Haws Erinne Gillentine

## 2021-09-02 NOTE — Transfer of Care (Signed)
Immediate Anesthesia Transfer of Care Note  Patient: Daniel Garner  Procedure(s) Performed: COLONOSCOPY WITH PROPOFOL  Patient Location: Endoscopy Unit  Anesthesia Type:General  Level of Consciousness: awake  Airway & Oxygen Therapy: Patient Spontanous Breathing  Post-op Assessment: Report given to RN  Post vital signs: Reviewed and stable  Last Vitals:  Vitals Value Taken Time  BP 109/64 09/02/21 1209  Temp    Pulse 80 09/02/21 1210  Resp 20 09/02/21 1210  SpO2 100 % 09/02/21 1210  Vitals shown include unvalidated device data.  Last Pain:  Vitals:   09/02/21 1209  TempSrc:   PainSc: Asleep         Complications: No notable events documented.

## 2021-09-02 NOTE — Op Note (Signed)
[facility] Gastroenterology Patient Name: Daniel Garner Procedure Date: 09/02/2021 11:39 AM MRN: [id number] Account #: [id number] Date of Birth: 1956-05-19 Admit Type: Outpatient Age: 66 Room: [facility] ENDO ROOM 3 Gender: Male Note Status: Finalized Instrument Name: Jasper Riling [id number] Procedure:             Colonoscopy Indications:           Screening for colorectal malignant neoplasm Providers:             Jonathon Bellows MD, MD Referring MD:          Bethena Roys. Sowles, MD (Referring MD) Medicines:             Monitored Anesthesia Care Complications:         No immediate complications. Procedure:             Pre-Anesthesia Assessment:                        - Prior to the procedure, a History and Physical was                         performed, and patient medications, allergies and                         sensitivities were reviewed. The patient's tolerance                         of previous anesthesia was reviewed.                        - The risks and benefits of the procedure and the                         sedation options and risks were discussed with the                         patient. All questions were answered and informed                         consent was obtained.                        - ASA Grade Assessment: II - A patient with mild                         systemic disease.                        After obtaining informed consent, the colonoscope was                         passed under direct vision. Throughout the procedure,                         the patient's blood pressure, pulse, and oxygen                         saturations were monitored continuously. The                         Colonoscope was  introduced through the anus and                         advanced to the the cecum, identified by the                         appendiceal orifice. The colonoscopy was performed                         with ease. The patient tolerated the procedure well.                          The quality of the bowel preparation was excellent. Findings:      The perianal and digital rectal examinations were normal.      Three sessile polyps were found in the sigmoid colon, transverse colon       and cecum. The polyps were 4 to 5 mm in size. These polyps were removed       with a cold snare. Resection and retrieval were complete.      The exam was otherwise without abnormality on direct and retroflexion       views. Impression:            - Three 4 to 5 mm polyps in the sigmoid colon, in the                         transverse colon and in the cecum, removed with a cold                         snare. Resected and retrieved.                        - The examination was otherwise normal on direct and                         retroflexion views. Recommendation:        - Discharge patient to home (with escort).                        - Resume previous diet.                        - Continue present medications.                        - Await pathology results.                        - Repeat colonoscopy for surveillance based on                         pathology results. Procedure Code(s):     --- Professional ---                        [phone], Colonoscopy, flexible; with removal of                         tumor(s), polyp(s), or other lesion(s) by snare  technique Diagnosis Code(s):     --- Professional ---                        Z12.11, Encounter for screening for malignant neoplasm                         of colon                        K63.5, Polyp of colon CPT copyright 2019 American [employer] Association. All rights reserved. The codes documented in this report are preliminary and upon coder review may  be revised to meet current compliance requirements. Jonathon Bellows, MD Jonathon Bellows MD, MD 09/02/2021 12:06:54 PM This report has been signed electronically. Number of Addenda: 0 Note Initiated On: 09/02/2021 11:39 AM Scope Withdrawal  Time: 0 hours 13 minutes 36 seconds  Total Procedure Duration: 0 hours 17 minutes 36 seconds  Estimated Blood Loss:  Estimated blood loss: none.      [facility]

## 2021-09-02 NOTE — Anesthesia Preprocedure Evaluation (Addendum)
Anesthesia Evaluation  Patient identified by MRN, date of birth, ID band Patient awake    Reviewed: Allergy & Precautions, NPO status , Patient's Chart, lab work & pertinent test results  History of Anesthesia Complications Negative for: history of anesthetic complications  Airway Mallampati: II  TM Distance: >3 FB Neck ROM: full    Dental  (+) Missing,    Pulmonary neg shortness of breath, sleep apnea and Continuous Positive Airway Pressure Ventilation ,    Pulmonary exam normal        Cardiovascular Exercise Tolerance: Good (-) angina(-) Past MI negative cardio ROS Normal cardiovascular exam     Neuro/Psych negative neurological ROS  negative psych ROS   GI/Hepatic Neg liver ROS, GERD  Controlled and Medicated,  Endo/Other  negative endocrine ROS  Renal/GU negative Renal ROS  negative genitourinary   Musculoskeletal  (+) Arthritis ,   Abdominal Normal abdominal exam  (+)   Peds  Hematology negative hematology ROS (+)   Anesthesia Other Findings Right inguinal hernia  Past Medical History: No date: Arthritis 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 2020: COVID No date: GERD (gastroesophageal reflux disease) 01/13/2015: Prostatism No date: Sleep apnea  Past Surgical History: 2000: COLONOSCOPY 08/06/2015: COLONOSCOPY WITH PROPOFOL; N/A     Comment:  Procedure: COLONOSCOPY WITH PROPOFOL;  Surgeon:               Christene Lye, MD;  Location: [facility];                Service: Endoscopy;  Laterality: N/A; 6568,1275: HERNIA REPAIR No date: TONSILLECTOMY  BMI    Body Mass Index: 24.69 kg/m      Reproductive/Obstetrics negative OB ROS                            Anesthesia Physical  Anesthesia Plan  ASA: 2  Anesthesia Plan: General   Post-op Pain Management: Tylenol PO (pre-op), Gabapentin PO (pre-op) and Toradol IV (intra-op)   Induction:  Intravenous  PONV Risk Score and Plan: Midazolam, Dexamethasone and Ondansetron  Airway Management Planned: Oral ETT  Additional Equipment:   Intra-op Plan:   Post-operative Plan:   Informed Consent: I have reviewed the patients History and Physical, chart, labs and discussed the procedure including the risks, benefits and alternatives for the proposed anesthesia with the patient or authorized representative who has indicated his/her understanding and acceptance.     Dental advisory given  Plan Discussed with: Anesthesiologist, CRNA and Surgeon  Anesthesia Plan Comments: (Patient consented for risks of anesthesia including but not limited to:  - adverse reactions to medications - risk of airway placement if required - damage to eyes, teeth, lips or other oral mucosa - nerve damage due to positioning  - sore throat or hoarseness - Damage to heart, brain, nerves, lungs, other parts of body or loss of life  Patient voiced understanding.)      Anesthesia Quick Evaluation

## 2021-09-02 NOTE — H&P (Signed)
Jonathon Bellows, MD [address], [place], [place], [place], [address] [address], [place], [place], [place], [address] Phone: [phone]  Fax: [phone]  Primary Care Physician:  Steele Sizer, MD   Pre-Procedure History & Physical: HPI:  ZEALAND BOYETT is a 66 y.o. male is here for an colonoscopy.   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   COVID 2020   GERD (gastroesophageal reflux disease)    Prostatism 01/13/2015   Sleep apnea     Past Surgical History:  Procedure Laterality Date   COLONOSCOPY  2000   COLONOSCOPY WITH PROPOFOL N/A 08/06/2015   Procedure: COLONOSCOPY WITH PROPOFOL;  Surgeon: Christene Lye, MD;  Location: [facility];  Service: Endoscopy;  Laterality: N/A;   HERNIA REPAIR  2007,2012   TONSILLECTOMY      Prior to Admission medications   Medication Sig Start Date End Date Taking? Authorizing Provider  Ascorbic Acid (VITAMIN C PO) Take 1 tablet by mouth daily.   Yes [provider]  Cyanocobalamin (B-12 PO) Take 1 capsule by mouth daily.   Yes [provider]  Omega-3 Fatty Acids (FISH OIL PO) Take 2 capsules by mouth daily.   Yes [provider]  omeprazole (PRILOSEC) 40 MG capsule Take 1 capsule (40 mg total) by mouth daily. 08/13/21  Yes Sowles, Drue Stager, MD  rosuvastatin (CRESTOR) 10 MG tablet Take 10 mg by mouth daily.   Yes [provider]  temazepam (RESTORIL) 30 MG capsule Take 1 capsule (30 mg total) by mouth at bedtime as needed for sleep. 06/26/21  Yes Sowles, Drue Stager, MD  VITAMIN D PO Take 1 capsule by mouth daily.   Yes [provider]  polyethylene glycol-electrolytes (NULYTELY) 420 g solution Prepare according to package instructions. Starting at 5:00 PM: Drink one 8 oz glass of mixture every 15 minutes until you finish half of the jug. Five hours prior to procedure, drink 8 oz glass of mixture every 15  minutes until it is all gone. Make sure you do not drink anything 4 hours prior to your procedure. 09/01/21   Jonathon Bellows, MD  sildenafil (VIAGRA) 25 MG tablet Take 1 tablet (25 mg total) by mouth daily as needed for erectile dysfunction. 07/30/21   Teodora Medici, DO  VITAMIN E PO Take 1-2 capsules by mouth daily.    [provider]    Allergies as of 09/01/2021   (No Known Allergies)    Family History  Problem Relation Age of Onset   Arthritis Mother    Colon cancer Father    Prostate cancer Father    Lung cancer Father     Social History   Socioeconomic History   Marital status: Married    Spouse name: Verl Bangs   Number of children: 3   Years of education: Not on file   Highest education level: Some college, no degree  Occupational History   Occupation: Librarian, academic   Tobacco Use   Smoking status: Never   Smokeless tobacco: Never  Vaping Use   Vaping Use: Never used  Substance and Sexual Activity   Alcohol use: Not Currently    Comment: > 36 yrs   Drug use: Not Currently    Comment: > 36 yrs   Sexual activity: Yes    Partners: Female    Birth control/protection: None  Other Topics Concern   Not on file  Social History Narrative  Not on file   Social Determinants of Health   Financial Resource Strain: Low Risk    Difficulty of Paying Living Expenses: Not very hard  Food Insecurity: No Food Insecurity   Worried About Running Out of Food in the Last Year: Never true   Ran Out of Food in the Last Year: Never true  Transportation Needs: No Transportation Needs   Lack of Transportation (Medical): No   Lack of Transportation (Non-Medical): No  Physical Activity: Insufficiently Active   Days of Exercise per Week: 2 days   Minutes of Exercise per Session: 40 min  Stress: No Stress Concern Present   Feeling of Stress : Only a little  Social Connections: Engineer, building services of Communication with Friends and Family: More than three times a  week   Frequency of Social Gatherings with Friends and Family: Once a week   Attends Religious Services: 1 to 4 times per year   Active Member of [employer] or Organizations: Yes   Attends Archivist Meetings: 1 to 4 times per year   Marital Status: Married  Human resources officer Violence: Not At Risk   Fear of Current or Ex-Partner: No   Emotionally Abused: No   Physically Abused: No   Sexually Abused: No    Review of Systems: See HPI, otherwise negative ROS  Physical Exam: BP 138/87    Pulse 80    Temp 98.1 F (36.7 C) (Temporal)    Resp 15    Ht 5\' 11"  (1.803 m)    Wt 80.3 kg    SpO2 99%    BMI 24.69 kg/m  General:   Alert,  pleasant and cooperative in NAD Head:  Normocephalic and atraumatic. Neck:  Supple; no masses or thyromegaly. Lungs:  Clear throughout to auscultation, normal respiratory effort.    Heart:  +S1, +S2, Regular rate and rhythm, No edema. Abdomen:  Soft, nontender and nondistended. Normal bowel sounds, without guarding, and without rebound.   Neurologic:  Alert and  oriented x4;  grossly normal neurologically.  Impression/Plan: AMOL DOMANSKI is here for an colonoscopy to be performed for Screening colonoscopy average risk   Risks, benefits, limitations, and alternatives regarding  colonoscopy have been reviewed with the patient.  Questions have been answered.  All parties agreeable.   Jonathon Bellows, MD  09/02/2021, 11:36 AM

## 2021-09-03 ENCOUNTER — Encounter
Admission: RE | Disposition: A | Discharge status: Home / Self Care | Payer: Self-pay | Source: Ambulatory Visit | Attending: Surgery

## 2021-09-03 ENCOUNTER — Ambulatory Visit
Admission: RE | Admit: 2021-09-03 | Discharge: 2021-09-03 | Disposition: A | Discharge status: Home / Self Care | Payer: 59 | Source: Ambulatory Visit | Attending: Surgery | Admitting: Surgery

## 2021-09-03 ENCOUNTER — Ambulatory Visit: Payer: 59 | Admitting: Anesthesiology

## 2021-09-03 ENCOUNTER — Encounter: Payer: Self-pay | Admitting: Surgery

## 2021-09-03 ENCOUNTER — Other Ambulatory Visit: Payer: Self-pay

## 2021-09-03 DIAGNOSIS — Z79899 Other long term (current) drug therapy: Secondary | ICD-10-CM | POA: Insufficient documentation

## 2021-09-03 DIAGNOSIS — K219 Gastro-esophageal reflux disease without esophagitis: Secondary | ICD-10-CM | POA: Diagnosis not present

## 2021-09-03 DIAGNOSIS — K429 Umbilical hernia without obstruction or gangrene: Secondary | ICD-10-CM | POA: Diagnosis not present

## 2021-09-03 DIAGNOSIS — G4730 Sleep apnea, unspecified: Secondary | ICD-10-CM | POA: Diagnosis not present

## 2021-09-03 DIAGNOSIS — K4031 Unilateral inguinal hernia, with obstruction, without gangrene, recurrent: Secondary | ICD-10-CM | POA: Diagnosis not present

## 2021-09-03 DIAGNOSIS — K4090 Unilateral inguinal hernia, without obstruction or gangrene, not specified as recurrent: Secondary | ICD-10-CM | POA: Diagnosis not present

## 2021-09-03 DIAGNOSIS — K4091 Unilateral inguinal hernia, without obstruction or gangrene, recurrent: Secondary | ICD-10-CM

## 2021-09-03 DIAGNOSIS — K4001 Bilateral inguinal hernia, with obstruction, without gangrene, recurrent: Secondary | ICD-10-CM

## 2021-09-03 DIAGNOSIS — K420 Umbilical hernia with obstruction, without gangrene: Secondary | ICD-10-CM | POA: Diagnosis not present

## 2021-09-03 HISTORY — PX: UMBILICAL HERNIA REPAIR: SHX196

## 2021-09-03 HISTORY — PX: INSERTION OF MESH: SHX5868

## 2021-09-03 LAB — SURGICAL PATHOLOGY

## 2021-09-03 SURGERY — HERNIORRHAPHY, INGUINAL, ROBOT-ASSISTED, LAPAROSCOPIC
Anesthesia: General

## 2021-09-03 MED ORDER — CHLORHEXIDINE GLUCONATE CLOTH 2 % EX PADS: 6.0000 | MEDICATED_PAD | Freq: Once | CUTANEOUS | Status: DC

## 2021-09-03 MED ORDER — SUGAMMADEX SODIUM 500 MG/5ML IV SOLN
INTRAVENOUS | Status: DC | PRN
  Administered 2021-09-03: 320 mg via INTRAVENOUS

## 2021-09-03 MED ORDER — OXYCODONE HCL 5 MG/5ML PO SOLN: 5.0000 mg | Freq: Once | ORAL | Status: AC | PRN

## 2021-09-03 MED ORDER — HYDROMORPHONE HCL 1 MG/ML IJ SOLN
INTRAMUSCULAR | Status: AC
  Filled 2021-09-03: qty 1

## 2021-09-03 MED ORDER — FAMOTIDINE 20 MG PO TABS: 20.0000 mg | ORAL_TABLET | Freq: Once | ORAL | Status: AC

## 2021-09-03 MED ORDER — HYDROMORPHONE HCL 1 MG/ML IJ SOLN
INTRAMUSCULAR | Status: DC | PRN
  Administered 2021-09-03: .5 mg via INTRAVENOUS

## 2021-09-03 MED ORDER — FENTANYL CITRATE (PF) 100 MCG/2ML IJ SOLN
INTRAMUSCULAR | Status: AC
  Filled 2021-09-03: qty 2

## 2021-09-03 MED ORDER — SUCCINYLCHOLINE CHLORIDE 200 MG/10ML IV SOSY
PREFILLED_SYRINGE | INTRAVENOUS | Status: DC | PRN
  Administered 2021-09-03: 100 mg via INTRAVENOUS

## 2021-09-03 MED ORDER — PHENYLEPHRINE HCL-NACL 20-0.9 MG/250ML-% IV SOLN
INTRAVENOUS | Status: AC
  Filled 2021-09-03: qty 250

## 2021-09-03 MED ORDER — ACETAMINOPHEN 500 MG PO TABS: 1000.0000 mg | ORAL_TABLET | Freq: Four times a day (QID) | ORAL | Status: DC | PRN

## 2021-09-03 MED ORDER — IBUPROFEN 600 MG PO TABS: 600.0000 mg | ORAL_TABLET | Freq: Three times a day (TID) | ORAL | 1 refills | Status: DC | PRN

## 2021-09-03 MED ORDER — PHENYLEPHRINE HCL-NACL 20-0.9 MG/250ML-% IV SOLN
INTRAVENOUS | Status: DC | PRN
  Administered 2021-09-03: 48 ug/min via INTRAVENOUS

## 2021-09-03 MED ORDER — DEXMEDETOMIDINE (PRECEDEX) IN NS 20 MCG/5ML (4 MCG/ML) IV SYRINGE
PREFILLED_SYRINGE | INTRAVENOUS | Status: DC | PRN
  Administered 2021-09-03: 20 ug via INTRAVENOUS

## 2021-09-03 MED ORDER — ACETAMINOPHEN 10 MG/ML IV SOLN: 1000.0000 mg | Freq: Once | INTRAVENOUS | Status: DC | PRN

## 2021-09-03 MED ORDER — GABAPENTIN 300 MG PO CAPS
ORAL_CAPSULE | ORAL | Status: AC
  Filled 2021-09-03: qty 1

## 2021-09-03 MED ORDER — FAMOTIDINE 20 MG PO TABS
ORAL_TABLET | ORAL | Status: AC
  Administered 2021-09-03: 20 mg via ORAL
  Filled 2021-09-03: qty 1

## 2021-09-03 MED ORDER — LACTATED RINGERS IV SOLN: INTRAVENOUS | Status: DC | PRN

## 2021-09-03 MED ORDER — PROPOFOL 500 MG/50ML IV EMUL
INTRAVENOUS | Status: AC
  Filled 2021-09-03: qty 50

## 2021-09-03 MED ORDER — CHLORHEXIDINE GLUCONATE 0.12 % MT SOLN
OROMUCOSAL | Status: AC
  Administered 2021-09-03: 15 mL via OROMUCOSAL
  Filled 2021-09-03: qty 15

## 2021-09-03 MED ORDER — ACETAMINOPHEN 500 MG PO TABS: 1000.0000 mg | ORAL_TABLET | ORAL | Status: AC

## 2021-09-03 MED ORDER — BUPIVACAINE LIPOSOME 1.3 % IJ SUSP: 20.0000 mL | Freq: Once | INTRAMUSCULAR | Status: DC

## 2021-09-03 MED ORDER — LACTATED RINGERS IV SOLN
INTRAVENOUS | Status: DC
Start: 2021-09-03 — End: 2021-09-03

## 2021-09-03 MED ORDER — BUPIVACAINE LIPOSOME 1.3 % IJ SUSP
INTRAMUSCULAR | Status: DC | PRN
  Administered 2021-09-03: 20 mL

## 2021-09-03 MED ORDER — OXYCODONE HCL 5 MG PO TABS: 5.0000 mg | ORAL_TABLET | ORAL | 0 refills | Status: DC | PRN

## 2021-09-03 MED ORDER — FENTANYL CITRATE (PF) 100 MCG/2ML IJ SOLN
INTRAMUSCULAR | Status: DC | PRN
  Administered 2021-09-03: 100 ug via INTRAVENOUS

## 2021-09-03 MED ORDER — CHLORHEXIDINE GLUCONATE 0.12 % MT SOLN: 15.0000 mL | Freq: Once | OROMUCOSAL | Status: AC

## 2021-09-03 MED ORDER — PHENYLEPHRINE HCL (PRESSORS) 10 MG/ML IV SOLN
INTRAVENOUS | Status: AC
  Filled 2021-09-03: qty 1

## 2021-09-03 MED ORDER — CEFAZOLIN SODIUM-DEXTROSE 2-4 GM/100ML-% IV SOLN
INTRAVENOUS | Status: AC
  Filled 2021-09-03: qty 100

## 2021-09-03 MED ORDER — OXYCODONE HCL 5 MG PO TABS
ORAL_TABLET | ORAL | Status: AC
  Administered 2021-09-03: 5 mg via ORAL
  Filled 2021-09-03: qty 1

## 2021-09-03 MED ORDER — BUPIVACAINE-EPINEPHRINE (PF) 0.5% -1:200000 IJ SOLN
INTRAMUSCULAR | Status: DC | PRN
  Administered 2021-09-03: 30 mL

## 2021-09-03 MED ORDER — FENTANYL CITRATE (PF) 100 MCG/2ML IJ SOLN
25.0000 ug | INTRAMUSCULAR | Status: DC | PRN
  Administered 2021-09-03: 25 ug via INTRAVENOUS

## 2021-09-03 MED ORDER — KETOROLAC TROMETHAMINE 30 MG/ML IJ SOLN
INTRAMUSCULAR | Status: DC | PRN
  Administered 2021-09-03: 15 mg via INTRAVENOUS

## 2021-09-03 MED ORDER — CEFAZOLIN SODIUM-DEXTROSE 2-4 GM/100ML-% IV SOLN
2.0000 g | INTRAVENOUS | Status: AC
  Administered 2021-09-03: 2 g via INTRAVENOUS

## 2021-09-03 MED ORDER — LIDOCAINE HCL (CARDIAC) PF 100 MG/5ML IV SOSY
PREFILLED_SYRINGE | INTRAVENOUS | Status: DC | PRN
  Administered 2021-09-03: 50 mg via INTRAVENOUS

## 2021-09-03 MED ORDER — BUPIVACAINE LIPOSOME 1.3 % IJ SUSP
INTRAMUSCULAR | Status: AC
  Filled 2021-09-03: qty 20

## 2021-09-03 MED ORDER — DEXAMETHASONE SODIUM PHOSPHATE 10 MG/ML IJ SOLN
INTRAMUSCULAR | Status: DC | PRN
  Administered 2021-09-03: 10 mg via INTRAVENOUS

## 2021-09-03 MED ORDER — OXYCODONE HCL 5 MG PO TABS: 5.0000 mg | ORAL_TABLET | Freq: Once | ORAL | Status: AC | PRN

## 2021-09-03 MED ORDER — PROPOFOL 10 MG/ML IV BOLUS
INTRAVENOUS | Status: DC | PRN
  Administered 2021-09-03: 200 mg via INTRAVENOUS

## 2021-09-03 MED ORDER — ACETAMINOPHEN 500 MG PO TABS
ORAL_TABLET | ORAL | Status: AC
  Administered 2021-09-03: 1000 mg via ORAL
  Filled 2021-09-03: qty 2

## 2021-09-03 MED ORDER — GABAPENTIN 300 MG PO CAPS
300.0000 mg | ORAL_CAPSULE | ORAL | Status: AC
  Administered 2021-09-03: 300 mg via ORAL

## 2021-09-03 MED ORDER — ORAL CARE MOUTH RINSE: 15.0000 mL | Freq: Once | OROMUCOSAL | Status: AC

## 2021-09-03 MED ORDER — PROMETHAZINE HCL 25 MG/ML IJ SOLN: 6.2500 mg | INTRAMUSCULAR | Status: DC | PRN

## 2021-09-03 MED ORDER — ROCURONIUM BROMIDE 100 MG/10ML IV SOLN
INTRAVENOUS | Status: DC | PRN
  Administered 2021-09-03: 50 mg via INTRAVENOUS
  Administered 2021-09-03 (×2): 20 mg via INTRAVENOUS

## 2021-09-03 MED ORDER — ONDANSETRON HCL 4 MG/2ML IJ SOLN
INTRAMUSCULAR | Status: DC | PRN
Start: 2021-09-03 — End: 2021-09-03
  Administered 2021-09-03: 4 mg via INTRAVENOUS

## 2021-09-03 MED ORDER — BUPIVACAINE-EPINEPHRINE (PF) 0.5% -1:200000 IJ SOLN
INTRAMUSCULAR | Status: AC
  Filled 2021-09-03: qty 30

## 2021-09-03 MED ORDER — SEVOFLURANE IN SOLN
RESPIRATORY_TRACT | Status: AC
  Filled 2021-09-03: qty 250

## 2021-09-03 SURGICAL SUPPLY — 74 items
ADH SKN CLS APL DERMABOND .7 (GAUZE/BANDAGES/DRESSINGS) ×3
APL PRP STRL LF DISP 70% ISPRP (MISCELLANEOUS) ×3
BLADE SURG 15 STRL LF DISP TIS (BLADE) ×3 IMPLANT
BLADE SURG 15 STRL SS (BLADE) ×4
CANNULA REDUC XI 12-8 STAPL (CANNULA) ×1
CANNULA REDUCER 12-8 DVNC XI (CANNULA) ×3 IMPLANT
CHLORAPREP W/TINT 26 (MISCELLANEOUS) ×4 IMPLANT
COVER TIP SHEARS 8 DVNC (MISCELLANEOUS) ×3 IMPLANT
COVER TIP SHEARS 8MM DA VINCI (MISCELLANEOUS) ×1
COVER WAND RF STERILE (DRAPES) ×4 IMPLANT
DEFOGGER SCOPE WARMER CLEARIFY (MISCELLANEOUS) ×4 IMPLANT
DERMABOND ADVANCED (GAUZE/BANDAGES/DRESSINGS) ×1
DERMABOND ADVANCED .7 DNX12 (GAUZE/BANDAGES/DRESSINGS) ×3 IMPLANT
DRAPE ARM DVNC X/XI (DISPOSABLE) ×9 IMPLANT
DRAPE COLUMN DVNC XI (DISPOSABLE) ×3 IMPLANT
DRAPE DA VINCI XI ARM (DISPOSABLE) ×3
DRAPE DA VINCI XI COLUMN (DISPOSABLE) ×1
DRAPE LAPAROTOMY 77X122 PED (DRAPES) ×3 IMPLANT
ELECT CAUTERY BLADE TIP 2.5 (TIP) ×4
ELECT REM PT RETURN 9FT ADLT (ELECTROSURGICAL) ×4
ELECTRODE CAUTERY BLDE TIP 2.5 (TIP) ×3 IMPLANT
ELECTRODE REM PT RTRN 9FT ADLT (ELECTROSURGICAL) ×3 IMPLANT
GAUZE 4X4 16PLY ~~LOC~~+RFID DBL (SPONGE) ×4 IMPLANT
GLOVE SURG SYN 7.0 (GLOVE) ×16 IMPLANT
GLOVE SURG SYN 7.0 PF PI (GLOVE) ×6 IMPLANT
GLOVE SURG SYN 7.5  E (GLOVE) ×4
GLOVE SURG SYN 7.5 E (GLOVE) ×12 IMPLANT
GLOVE SURG SYN 7.5 PF PI (GLOVE) ×6 IMPLANT
GOWN STRL REUS W/ TWL LRG LVL3 (GOWN DISPOSABLE) ×12 IMPLANT
GOWN STRL REUS W/TWL LRG LVL3 (GOWN DISPOSABLE) ×16
IRRIGATION STRYKERFLOW (MISCELLANEOUS) ×3 IMPLANT
IRRIGATOR STRYKERFLOW (MISCELLANEOUS) ×4
IV NS 1000ML (IV SOLUTION)
IV NS 1000ML BAXH (IV SOLUTION) IMPLANT
KIT PINK PAD W/HEAD ARE REST (MISCELLANEOUS) ×4
KIT PINK PAD W/HEAD ARM REST (MISCELLANEOUS) ×3 IMPLANT
LABEL OR SOLS (LABEL) ×4 IMPLANT
MANIFOLD NEPTUNE II (INSTRUMENTS) ×4 IMPLANT
MESH 3DMAX 4X6 LT LRG (Mesh General) ×1 IMPLANT
MESH 3DMAX 4X6 RT LRG (Mesh General) ×1 IMPLANT
MESH 3DMAX MID 4X6 LT LRG (Mesh General) IMPLANT
MESH 3DMAX MID 4X6 RT LRG (Mesh General) IMPLANT
NDL INSUFFLATION 14GA 120MM (NEEDLE) ×3 IMPLANT
NEEDLE HYPO 22GX1.5 SAFETY (NEEDLE) ×4 IMPLANT
NEEDLE INSUFFLATION 14GA 120MM (NEEDLE) ×4 IMPLANT
NS IRRIG 500ML POUR BTL (IV SOLUTION) ×4 IMPLANT
OBTURATOR OPTICAL STANDARD 8MM (TROCAR) ×1
OBTURATOR OPTICAL STND 8 DVNC (TROCAR) ×3
OBTURATOR OPTICALSTD 8 DVNC (TROCAR) ×3 IMPLANT
PACK BASIN MINOR ARMC (MISCELLANEOUS) ×3 IMPLANT
PACK LAP CHOLECYSTECTOMY (MISCELLANEOUS) ×4 IMPLANT
PENCIL ELECTRO HAND CTR (MISCELLANEOUS) ×4 IMPLANT
POUCH ENDO CATCH 10MM SPEC (MISCELLANEOUS) ×1 IMPLANT
SEAL CANN UNIV 5-8 DVNC XI (MISCELLANEOUS) ×9 IMPLANT
SEAL XI 5MM-8MM UNIVERSAL (MISCELLANEOUS) ×3
SET TUBE SMOKE EVAC HIGH FLOW (TUBING) ×4 IMPLANT
SOLUTION ELECTROLUBE (MISCELLANEOUS) ×4 IMPLANT
SPONGE T-LAP 18X18 ~~LOC~~+RFID (SPONGE) ×4 IMPLANT
STAPLER CANNULA SEAL DVNC XI (STAPLE) ×3 IMPLANT
STAPLER CANNULA SEAL XI (STAPLE) ×1
SUT ETHIBOND 0 MO6 C/R (SUTURE) ×4 IMPLANT
SUT MNCRL AB 4-0 PS2 18 (SUTURE) ×4 IMPLANT
SUT VIC AB 2-0 SH 27 (SUTURE) ×8
SUT VIC AB 2-0 SH 27XBRD (SUTURE) ×6 IMPLANT
SUT VIC AB 3-0 SH 27 (SUTURE) ×4
SUT VIC AB 3-0 SH 27X BRD (SUTURE) ×3 IMPLANT
SUT VICRYL 0 AB UR-6 (SUTURE) ×8 IMPLANT
SUT VLOC 90 S/L VL9 GS22 (SUTURE) ×5 IMPLANT
SYR 20ML LL LF (SYRINGE) ×4 IMPLANT
SYR BULB IRRIG 60ML STRL (SYRINGE) ×3 IMPLANT
TAPE TRANSPORE STRL 2 31045 (GAUZE/BANDAGES/DRESSINGS) ×4 IMPLANT
TRAY FOLEY SLVR 16FR LF STAT (SET/KITS/TRAYS/PACK) ×4 IMPLANT
TROCAR BALLN GELPORT 12X130M (ENDOMECHANICALS) ×4 IMPLANT
WATER STERILE IRR 500ML POUR (IV SOLUTION) ×3 IMPLANT

## 2021-09-03 NOTE — Interval H&P Note (Signed)
History and Physical Interval Note:  09/03/2021 7:12 AM  Daniel Garner  has presented today for surgery, with the diagnosis of right inguinal hernia, umbilical hernia incarcerated.  The various methods of treatment have been discussed with the patient and family. After consideration of risks, benefits and other options for treatment, the patient has consented to  Procedure(s): XI ROBOTIC ASSISTED INGUINAL HERNIA (Right) HERNIA REPAIR UMBILICAL ADULT, incarcerated, open (N/A) as a surgical intervention.  The patient's history has been reviewed, patient examined, no change in status, stable for surgery.  I have reviewed the patient's chart and labs.  Questions were answered to the patient's satisfaction.     [name]

## 2021-09-03 NOTE — Anesthesia Postprocedure Evaluation (Signed)
Anesthesia Post Note  Patient: LIZZIE COKLEY  Procedure(s) Performed: XI ROBOTIC ASSISTED INGUINAL HERNIA (Bilateral) HERNIA REPAIR UMBILICAL ADULT, incarcerated, open INSERTION OF MESH  Patient location during evaluation: PACU Anesthesia Type: General Level of consciousness: awake and alert Pain management: pain level controlled Vital Signs Assessment: post-procedure vital signs reviewed and stable Respiratory status: spontaneous breathing, nonlabored ventilation and respiratory function stable Cardiovascular status: blood pressure returned to baseline and stable Postop Assessment: no apparent nausea or vomiting Anesthetic complications: no   No notable events documented.   Last Vitals:  Vitals:   09/03/21 1115 09/03/21 1146  BP: 121/62 118/81  Pulse:  74  Resp:  14  Temp: (!) 36.1 C (!) 36.2 C  SpO2:  95%    Last Pain:  Vitals:   09/03/21 1146  TempSrc: Temporal  PainSc: 0-No pain                 Iran Ouch

## 2021-09-03 NOTE — Transfer of Care (Signed)
Immediate Anesthesia Transfer of Care Note  Patient: Daniel Garner  Procedure(s) Performed: XI ROBOTIC ASSISTED INGUINAL HERNIA (Bilateral) HERNIA REPAIR UMBILICAL ADULT, incarcerated, open INSERTION OF MESH  Patient Location: PACU  Anesthesia Type:General  Level of Consciousness: drowsy  Airway & Oxygen Therapy: Patient Spontanous Breathing and Patient connected to face mask oxygen  Post-op Assessment: Report given to RN and Post -op Vital signs reviewed and stable  Post vital signs: Reviewed and stable  Last Vitals:  Vitals Value Taken Time  BP 129/81 09/03/21 1045  Temp    Pulse 70 09/03/21 1049  Resp 18 09/03/21 1049  SpO2 100 % 09/03/21 1049  Vitals shown include unvalidated device data.  Last Pain:  Vitals:   09/03/21 1041  TempSrc:   PainSc: Asleep         Complications: No notable events documented.

## 2021-09-03 NOTE — Anesthesia Procedure Notes (Signed)
Procedure Name: Intubation Date/Time: 09/03/2021 7:41 AM Performed by: Beverely Low, CRNA Pre-anesthesia Checklist: Patient identified, Patient being monitored, Timeout performed, Emergency Drugs available and Suction available Patient Re-evaluated:Patient Re-evaluated prior to induction Oxygen Delivery Method: Circle system utilized Preoxygenation: Pre-oxygenation with 100% oxygen Induction Type: IV induction Ventilation: Mask ventilation without difficulty Laryngoscope Size: Mac and 3 Grade View: Grade I Tube type: Oral Tube size: 7.5 mm Number of attempts: 1 Placement Confirmation: ETT inserted through vocal cords under direct vision, positive ETCO2 and breath sounds checked- equal and bilateral Secured at: 21 cm Tube secured with: Tape Dental Injury: Teeth and Oropharynx as per pre-operative assessment

## 2021-09-03 NOTE — Op Note (Addendum)
Procedure Date:  09/03/2021  Pre-operative Diagnosis:  Recurrent Incarcerated Right inguinal hernia, reducible umbilical hernia  Post-operative Diagnosis: Recurrent Incarcerated right inguinal hernia, recurrent reducible left inguinal hernia, reducible umbilical hernia  Procedure: 1.  Robotic assisted Bilateral Inguinal Hernia Repair 2.  Creation of Bilateral Posterior Rectus-Transversalis Fascia Advancment Flap for Coverage of Pelvic Wound (200 cm) 3.  Open umbilical hernia repair  Surgeon:  Melvyn Neth, MD  Anesthesia:  General endotracheal  Estimated Blood Loss:  15 ml  Specimens:  None  Complications:  None  Indications for Procedure:  This is a 66 y.o. male with a history of prior open right inguinal hernia repair in 2007 and open left inguinal hernia repair in 2012.  On exam, he has an incarcerated recurrent right inguinal hernia, which is tender when trying to reduce.  There's concern on his exam that he may also have a recurrent left inguinal hernia, and he also has a small umbilical hernia. The options of surgery versus observation were reviewed with the patient and/or family. The risks of bleeding, abscess or infection, recurrence of symptoms, potential for an open procedure, injury to surrounding structures, and chronic pain were all discussed with the patient and was he willing to proceed.  We have planned this transabdominal procedure with the creation of right vs bilateral peritoneal flap based on the posterior rectus sheath and transversalis fascia in order to fully cover the mesh, creating a natural tisssue barrier for the bowel and peritoneal cavity.  Description of Procedure: The patient was correctly identified in the preoperative area and brought into the operating room.  The patient was placed supine with VTE prophylaxis in place.  Appropriate time-outs were performed.  Anesthesia was induced and the patient was intubated.  Foley catheter was placed.  Appropriate  antibiotics were infused.  The abdomen was prepped and draped in a sterile fashion. A supraumbilical incision was made. Cautery was used to dissect along the umbilical stalk and to separate the stalk from the fascia.  This revealed an 8 mm hernia defect.  The fascial edges were cleared and the defect was enlarged to fit a 12 mm robotic port.  Pneumoperitoneum was obtained with appropriate opening pressures.  A Veress needle was used to start dissecting the peritoneal flap.  Two 8-mm robotic ports were placed in the right and left lateral positions under direct visualization.  A large left and right 3D Max Mid Bard Mesh, a 2-0 Vicryl, and two 2-0 vloc suture were placed through the umbilical port under direct visualization.  The [employer] platform was docked onto the patient, the camera was inserted and targeted, and the instruments were placed under direct visualization.  Both inguinal regions were inspected for hernias and it was confirmed that the patient had bilateral direct inguinal hernias.  We started on the right side.  Using electocautery, the peritoneal and posterior rectus tissue flap was created.  The peritoneum on the right side was scored from the median umbilical ligament laterally towards the ASIS.  The flap was mobilized using robotic scissors and the bipolar instruments, creating a plane along the posterior rectus sheath and transversalis fascia down to the pubic tubercle medially. It was then further mobilized laterally across the inguinal canal and femoral vessels and onto the psoas muscle. The inferior epigastric vessels were identified and preserved. This created a posterior rectus and peritoneal flap measuring roughly 17 cm x 12 cm.  The hernia sac and contents were reduced preserving all structures.    We  then proceeded with dissection of the left side.  Using electocautery, the peritoneal and posterior rectus tissue flap was created.  The peritoneum on the left side was scored from the  median umbilical ligament laterally towards the ASIS.  The flap was mobilized using robotic scissors and the bipolar instruments, creating a plane along the posterior rectus sheath and transversalis fascia down to the pubic tubercle medially. It was then further mobilized laterally across the inguinal canal and femoral vessels and onto the psoas muscle. The inferior epigastric vessels were identified and preserved. This created a posterior rectus and peritoneal flap measuring roughly 17 cm x 12 cm.  The hernia sac and contents were reduced preserving all structures.  A right and left large Bard 3D Max Mid mesh were placed with good overlap along all the potential hernia defects and secured in place with 2-0 Vicryl along the medial superomedial and superolateral aspects.  Then, the peritoneal flaps were advanced over the mesh and carried over to close the defect. A running 2-0 V lock suture was used to approximate the edge of the flap onto the peritoneum on each side.  All needles were removed under direct visualization.  The 8- mm ports were removed under direct visualization and the Hasson trocar was removed.  The hernia defect was closed using 0 vicryl sutures.  Local anesthetic was infused in all incisions as well as a bilateral ilioinguinal block.  The umbilical incision was closed with 3-0 Vicryl and 4-0 Monocryl.  The remaining port incisions were closed with 4-0 Monocryl.  The wounds were cleaned and sealed with DermaBond.  Foley catheter was removed and the patient was emerged from anesthesia and extubated and brought to the recovery room for further management.  The patient tolerated the procedure well and all counts were correct at the end of the case.   Melvyn Neth, MD

## 2021-09-07 ENCOUNTER — Encounter: Payer: Self-pay | Admitting: Gastroenterology

## 2021-09-08 NOTE — Progress Notes (Signed)
Name: Daniel Garner   MRN: [id number]    DOB: 10-08-1955   Date:09/09/2021       Progress Note  Subjective  Chief Complaint  Follow Up  HPI  Testicular pain/LUTS: he states symptoms has been going on since Fall 2022. Initially some pain - aching like on left testicle. He also had a high IPSS score during his CPE 11/22, we gave him flomax and symptoms improved but he stopped because it cause insomnia. He states testicular pain continues to happen, when sitting it feels like a soreness, also states also bothersome when laying on his back at nigh, causes pain, he has left testicular redness and swelling.    He has a right inguinal hernia : seen by Dr. Hampton Abbot and had it repaired last week.   Anxiety: he stopped taking  Lexapro because he thinks it caused constipation , currently just taking Temazepam for sleep and seems to help at times however afraid of getting dependent on it and would like to try a lower dose. Marland Kitchen He states since he lost his job March 22 he has been consumed by the though of having cancer. He has seen multiple doctors and continues to have pains in different locations. He worries constantly, past couple of weeks he has noticed lack of interested. Not going to church, doesn't want to get out of bed in the mornings to do his devotionals. Stopped going to the gym. During the visit he cried when he talked about his father dying of lung cancer at age 65 and brother died in his late October 10, 2022 of some form of cancer but not sure of what type.  He is constantly worrying about his health. . We gave him zoloft on his last visit but he never filled it . Phq 9 still positive. Today he is very worried about his testicle   Atherosclerosis of aorta: taking statin therapy and denies side effects, last LDL was 140 and he just started taking medication, goal is below 70   Patient Active Problem List   Diagnosis Date Noted   Recurrent right inguinal hernia    Recurrent left inguinal hernia     Umbilical hernia without obstruction and without gangrene    Atherosclerosis of aorta ([place]) 06/09/2021   Lower urinary tract symptoms (LUTS) 06/09/2021   Kidney stone on right side 06/09/2021   Right inguinal hernia 06/09/2021   Constipation 04/06/2021   Osteoarthritis of right hip 05/27/2017   Dyslipidemia 08/22/2016   Chondromalacia of knee, 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   Sleep apnea 08/16/2016   History of migraine 08/16/2016   ED (erectile dysfunction) 08/16/2016   GERD without esophagitis 08/16/2016   Prostatism 01/13/2015    Past Surgical History:  Procedure Laterality Date   COLONOSCOPY  10-Oct-1998   COLONOSCOPY WITH PROPOFOL N/A 08/06/2015   Procedure: COLONOSCOPY WITH PROPOFOL;  Surgeon: Christene Lye, MD;  Location: [facility] ENDOSCOPY;  Service: Endoscopy;  Laterality: N/A;   COLONOSCOPY WITH PROPOFOL N/A 09/02/2021   Procedure: COLONOSCOPY WITH PROPOFOL;  Surgeon: Jonathon Bellows, MD;  Location: [facility] ENDOSCOPY;  Service: Gastroenterology;  Laterality: N/A;   HERNIA REPAIR  [id number]   INSERTION OF MESH  09/03/2021   Procedure: INSERTION OF MESH;  Surgeon: Olean Ree, MD;  Location: [facility] ORS;  Service: General;;   TONSILLECTOMY     UMBILICAL HERNIA REPAIR N/A 09/03/2021   Procedure: HERNIA REPAIR UMBILICAL ADULT, incarcerated, open;  Surgeon: Olean Ree, MD;  Location: [facility] ORS;  Service: General;  Laterality: N/A;    Family  History  Problem Relation Age of Onset   Arthritis Mother    Colon cancer Father    Prostate cancer Father    Lung cancer Father     Social History   Tobacco Use   Smoking status: Never   Smokeless tobacco: Never  Substance Use Topics   Alcohol use: Not Currently    Comment: > 36 yrs     Current Outpatient Medications:    acetaminophen (TYLENOL) 500 MG tablet, Take 2 tablets (1,000 mg total) by mouth every 6 (six) hours as needed for mild pain., Disp: , Rfl:    Ascorbic Acid (VITAMIN C PO), Take 1 tablet by mouth daily., Disp: , Rfl:    Cyanocobalamin  (B-12 PO), Take 1 capsule by mouth daily., Disp: , Rfl:    ibuprofen (ADVIL) 600 MG tablet, Take 1 tablet (600 mg total) by mouth every 8 (eight) hours as needed for moderate pain., Disp: 60 tablet, Rfl: 1   Omega-3 Fatty Acids (FISH OIL PO), Take 2 capsules by mouth daily., Disp: , Rfl:    omeprazole (PRILOSEC) 40 MG capsule, Take 1 capsule (40 mg total) by mouth daily., Disp: 90 capsule, Rfl: 0   oxyCODONE (OXY IR/ROXICODONE) 5 MG immediate release tablet, Take 1 tablet (5 mg total) by mouth every 4 (four) hours as needed for severe pain., Disp: 30 tablet, Rfl: 0   polyethylene glycol-electrolytes (NULYTELY) 420 g solution, Prepare according to package instructions. Starting at 5:00 PM: Drink one 8 oz glass of mixture every 15 minutes until you finish half of the jug. Five hours prior to procedure, drink 8 oz glass of mixture every 15 minutes until it is all gone. Make sure you do not drink anything 4 hours prior to your procedure., Disp: 4000 mL, Rfl: 0   rosuvastatin (CRESTOR) 10 MG tablet, Take 10 mg by mouth daily., Disp: , Rfl:    sildenafil (VIAGRA) 25 MG tablet, Take 1 tablet (25 mg total) by mouth daily as needed for erectile dysfunction., Disp: 10 tablet, Rfl: 0   temazepam (RESTORIL) 30 MG capsule, Take 1 capsule (30 mg total) by mouth at bedtime as needed for sleep., Disp: 30 capsule, Rfl: 2   VITAMIN D PO, Take 1 capsule by mouth daily., Disp: , Rfl:    VITAMIN E PO, Take 1-2 capsules by mouth daily., Disp: , Rfl:    Zoster Vaccine Adjuvanted [facility]) injection, Inject 0.5 mLs into the muscle once for 1 dose., Disp: 0.5 mL, Rfl: 0  No Known Allergies  I personally reviewed active problem list, medication list, allergies, family history, social history, health maintenance with the patient/caregiver today.   ROS  Ten systems reviewed and is negative except as mentioned in HPI   Objective  Vitals:   09/09/21 1049  BP: 116/68  Pulse: 73  Resp: 16  SpO2: 98%  Weight: 175 lb  (79.4 kg)  Height: 5\' 10"  (1.778 m)    Body mass index is 25.11 kg/m.  Physical Exam  Constitutional: Patient appears well-developed and well-nourished.  No distress.  HEENT: head atraumatic, normocephalic, pupils equal and reactive to light, neck supple Cardiovascular: Normal rate, regular rhythm and normal heart sounds.  No murmur heard. No BLE edema. Pulmonary/Chest: Effort normal and breath sounds normal. No respiratory distress. Abdominal: Soft.  There is no tenderness. Genitalia: normal testicles, no pain , no redness  Psychiatric: Patient has a normal mood and affect. behavior is normal. Judgment and thought content normal.   Recent Results (from the past  2160 hour(s))  Basic Metabolic Panel (BMET)     Status: None   Collection Time: 07/30/21 11:37 AM  Result Value Ref Range   Glucose, Bld 92 65 - 99 mg/dL    Comment: .            Fasting reference interval .    BUN 9 7 - 25 mg/dL   Creat 1.06 0.70 - 1.35 mg/dL   BUN/Creatinine Ratio NOT APPLICABLE 6 - 22 (calc)   Sodium 138 135 - 146 mmol/L   Potassium 4.5 3.5 - 5.3 mmol/L   Chloride 102 98 - 110 mmol/L   CO2 30 20 - 32 mmol/L   Calcium 9.8 8.6 - 10.3 mg/dL  Lipid panel     Status: Abnormal   Collection Time: 07/30/21 11:37 AM  Result Value Ref Range   Cholesterol 211 (H) <200 mg/dL   HDL 55 > OR = 40 mg/dL   Triglycerides 67 <150 mg/dL   LDL Cholesterol (Calc) 140 (H) mg/dL (calc)    Comment: Reference range: <100 . Desirable range <100 mg/dL for primary prevention;   <70 mg/dL for patients with CHD or diabetic patients  with > or = 2 CHD risk factors. Marland Kitchen LDL-C is now calculated using the Martin-Hopkins  calculation, which is a validated novel method providing  better accuracy than the Friedewald equation in the  estimation of LDL-C.  Cresenciano Genre et al. Annamaria Helling. 2725;366(44): 2061-2068  (http://education.[employer].com/faq/FAQ164)    Total CHOL/HDL Ratio 3.8 <5.0 (calc)   Non-HDL Cholesterol (Calc) 156  (H) <130 mg/dL (calc)    Comment: For patients with diabetes plus 1 major ASCVD risk  factor, treating to a non-HDL-C goal of <100 mg/dL  (LDL-C of <70 mg/dL) is considered a therapeutic  option.   PSA     Status: None   Collection Time: 07/30/21 11:37 AM  Result Value Ref Range   PSA 2.20 < OR = 4.00 ng/mL    Comment: The total PSA value from this assay system is  standardized against the WHO standard. The test  result will be approximately 20% lower when compared  to the equimolar-standardized total PSA (Beckman  Coulter). Comparison of serial PSA results should be  interpreted with this fact in mind. . This test was performed using the Siemens  chemiluminescent method. Values obtained from  different assay methods cannot be used interchangeably. PSA levels, regardless of value, should not be interpreted as absolute evidence of the presence or absence of disease.   POCT Urinalysis Dipstick     Status: None   Collection Time: 07/30/21 11:41 AM  Result Value Ref Range   Color, UA Yellow    Clarity, UA Cloudy    Glucose, UA Negative Negative   Bilirubin, UA Negative    Ketones, UA small    Spec Grav, UA 1.010 1.010 - 1.025   Blood, UA Negative    pH, UA 6.0 5.0 - 8.0   Protein, UA Negative Negative   Urobilinogen, UA 0.2 0.2 or 1.0 E.U./dL   Nitrite, UA Negative    Leukocytes, UA Negative Negative   Appearance Yellow    Odor Foul   Surgical pathology     Status: None   Collection Time: 09/02/21 11:53 AM  Result Value Ref Range   SURGICAL PATHOLOGY      SURGICAL PATHOLOGY CASE: [id number] PATIENT: Mcarthur Rossetti Surgical Pathology Report     Specimen Submitted: A. Colon polyp, cecum; cold snare B. Colon polyp, transverse; cold snare C. Colon polyp,  sigmoid; cold snare  Clinical History: Screening.  Colon polyps    DIAGNOSIS: A. COLON POLYP, CECUM; COLD SNARE: - TUBULAR ADENOMA. - NEGATIVE FOR HIGH-GRADE DYSPLASIA AND MALIGNANCY.  B. COLON POLYP,  TRANSVERSE; COLD SNARE: - POLYPOID FRAGMENT OF BENIGN COLONIC MUCOSA WITH SUPERFICIAL REACTIVE CHANGES AND PROMINENT LYMPHOID AGGREGATE. - NEGATIVE FOR DYSPLASIA AND MALIGNANCY.  C. COLON POLYP, SIGMOID; COLD SNARE: - POLYPOID FRAGMENT OF BENIGN COLONIC MUCOSA WITH SUPERFICIAL REACTIVE CHANGES. - NEGATIVE FOR DYSPLASIA AND MALIGNANCY.  Comment: Multiple additional deeper recut levels were examined for parts B and C.  GROSS DESCRIPTION: A. Labeled: Cold snare polyp cecum Received: Formalin Collection time: 11:53 AM on 09/02/2021 Placed into formalin time: 1 1:53 AM on 09/02/2021 Tissue fragment(s): Multiple Size: Aggregate, 0.7 x 0.3 x 0.1 cm Description: Received are tan soft tissue fragments, admixed with intestinal debris.  The ratio of soft tissue to intestinal debris is 70: 30. Entirely submitted in 1 cassette.  B. Labeled: Cold snare polyp transverse colon Received: Formalin Collection time: 11:59 AM on 09/02/2021 Placed into formalin time: 11:59 AM on 09/02/2021 Tissue fragment(s): Multiple Size: Aggregate, 1.2 x 0.3 x 0.1 cm Description: Received are tan soft tissue fragments, admixed with intestinal debris.  The ratio of soft tissue to intestinal debris is 80: 20. Entirely submitted in 1 cassette.  C. Labeled: Cold snare polyp sigmoid colon Received: Formalin Collection time: 12:03 PM on 09/02/2021 Placed into formalin time: 12:03 PM on 09/02/2021 Tissue fragment(s): Multiple Size: Aggregate, 0.6 x 0.5 x 0.1 cm Description: Received are tan soft tissue fragments, admixed with intestinal debris.  The ratio of soft  tissue to intestinal debris is 90: 10. Entirely submitted in 1 cassette.  CM 09/02/2021  Final Diagnosis performed by Allena Napoleon, MD.   Electronically signed 09/03/2021 12:47:14PM The electronic signature indicates that the named Attending Pathologist has evaluated the specimen Technical component performed at [facility], [address], [place], [place] [address]  Lab: [id number] Dir: Rush Farmer, MD, MMM  Professional component performed at [facility], [facility], [place], [place],  [address] Lab: [id number] Dir: Kathi Simpers, MD     PHQ2/9: Depression screen [facility] 2/9 09/09/2021 08/13/2021 07/30/2021 06/09/2021 05/14/2021  Decreased Interest 1 3 0 1 2  Down, Depressed, Hopeless 1 2 0 1 2  PHQ - 2 Score 2 5 0 2 4  Altered sleeping 3 2 0 3 3  Tired, decreased energy 3 2 0 1 2  Change in appetite 0 0 0 2 2  Feeling bad or failure about yourself  1 0 0 1 1  Trouble concentrating 0 0 0 0 1  Moving slowly or fidgety/restless 0 0 0 0 0  Suicidal thoughts 0 0 0 0 0  PHQ-9 Score 9 9 0 9 13  Difficult doing work/chores - Very difficult Not difficult at all Somewhat difficult Somewhat difficult  Some recent data might be hidden    phq 9 is positive   Fall Risk: Fall Risk  09/09/2021 08/13/2021 07/30/2021 06/09/2021 05/14/2021  Falls in the past year? 0 0 0 0 0  Number falls in past yr: 0 0 0 0 0  Injury with Fall? 0 0 0 0 0  Risk for fall due to : No Fall Risks No Fall Risks - No Fall Risks -  Follow up Falls prevention discussed Falls prevention discussed - Falls prevention discussed Falls evaluation completed      Functional Status Survey: Is the patient deaf or have difficulty hearing?: No Does  the patient have difficulty seeing, even when wearing glasses/contacts?: No Does the patient have difficulty concentrating, remembering, or making decisions?: No Does the patient have difficulty walking or climbing stairs?: No Does the patient have difficulty dressing or bathing?: No Does the patient have difficulty doing errands alone such as visiting a doctor's office or shopping?: No    Assessment & Plan  1. Testicular pain, left  - US Scrotum; Future - Ambulatory referral to Urology  2. Lower urinary tract symptoms (LUTS)  - Ambulatory referral to Urology  3. Atherosclerosis of aorta ([place])   4.  Psychophysiological insomnia  - temazepam (RESTORIL) 15 MG capsule; Take 1 capsule (15 mg total) by mouth at bedtime as needed for sleep.  Dispense: 30 capsule; Refill: 1  5. Need for shingles vaccine  - Zoster Vaccine Adjuvanted [facility]) injection; Inject 0.5 mLs into the muscle once for 1 dose.  Dispense: 0.5 mL; Refill: 0

## 2021-09-09 ENCOUNTER — Ambulatory Visit (INDEPENDENT_AMBULATORY_CARE_PROVIDER_SITE_OTHER): Payer: 59 | Admitting: Family Medicine

## 2021-09-09 ENCOUNTER — Other Ambulatory Visit: Payer: Self-pay

## 2021-09-09 ENCOUNTER — Encounter: Payer: Self-pay | Admitting: Family Medicine

## 2021-09-09 VITALS — BP 116/68 | HR 73 | Resp 16 | Ht 70.0 in | Wt 175.0 lb

## 2021-09-09 DIAGNOSIS — I700 Atherosclerosis of aorta: Secondary | ICD-10-CM | POA: Diagnosis not present

## 2021-09-09 DIAGNOSIS — R399 Unspecified symptoms and signs involving the genitourinary system: Secondary | ICD-10-CM | POA: Diagnosis not present

## 2021-09-09 DIAGNOSIS — R69 Illness, unspecified: Secondary | ICD-10-CM | POA: Diagnosis not present

## 2021-09-09 DIAGNOSIS — Z23 Encounter for immunization: Secondary | ICD-10-CM | POA: Diagnosis not present

## 2021-09-09 DIAGNOSIS — N50812 Left testicular pain: Secondary | ICD-10-CM | POA: Diagnosis not present

## 2021-09-09 DIAGNOSIS — F5104 Psychophysiologic insomnia: Secondary | ICD-10-CM

## 2021-09-09 MED ORDER — TEMAZEPAM 15 MG PO CAPS: 15.0000 mg | ORAL_CAPSULE | Freq: Every evening | ORAL | 1 refills | Status: DC | PRN

## 2021-09-09 MED ORDER — SHINGRIX 50 MCG/0.5ML IM SUSR: 0.5000 mL | Freq: Once | INTRAMUSCULAR | 0 refills | Status: AC

## 2021-09-14 ENCOUNTER — Other Ambulatory Visit: Payer: Self-pay

## 2021-09-14 ENCOUNTER — Ambulatory Visit
Admission: RE | Admit: 2021-09-14 | Discharge: 2021-09-14 | Disposition: A | Discharge status: Home / Self Care | Payer: 59 | Source: Ambulatory Visit | Attending: Family Medicine | Admitting: Family Medicine

## 2021-09-14 DIAGNOSIS — I861 Scrotal varices: Secondary | ICD-10-CM | POA: Diagnosis not present

## 2021-09-14 DIAGNOSIS — N433 Hydrocele, unspecified: Secondary | ICD-10-CM | POA: Diagnosis not present

## 2021-09-14 DIAGNOSIS — N50812 Left testicular pain: Secondary | ICD-10-CM | POA: Insufficient documentation

## 2021-09-15 ENCOUNTER — Ambulatory Visit: Payer: 59

## 2021-09-15 ENCOUNTER — Ambulatory Visit: Payer: 59 | Admitting: Urology

## 2021-09-15 ENCOUNTER — Other Ambulatory Visit
Admission: RE | Admit: 2021-09-15 | Discharge: 2021-09-15 | Disposition: A | Discharge status: Home / Self Care | Payer: 59 | Attending: Urology | Admitting: Urology

## 2021-09-15 ENCOUNTER — Other Ambulatory Visit: Payer: Self-pay | Admitting: *Deleted

## 2021-09-15 ENCOUNTER — Encounter: Payer: Self-pay | Admitting: Urology

## 2021-09-15 VITALS — BP 121/74 | HR 84 | Ht 70.5 in | Wt 174.6 lb

## 2021-09-15 DIAGNOSIS — N50819 Testicular pain, unspecified: Secondary | ICD-10-CM

## 2021-09-15 DIAGNOSIS — R102 Pelvic and perineal pain: Secondary | ICD-10-CM | POA: Diagnosis not present

## 2021-09-15 DIAGNOSIS — Z125 Encounter for screening for malignant neoplasm of prostate: Secondary | ICD-10-CM | POA: Diagnosis not present

## 2021-09-15 LAB — URINALYSIS, COMPLETE (UACMP) WITH MICROSCOPIC
Bacteria, UA: NONE SEEN
Bilirubin Urine: NEGATIVE
Glucose, UA: NEGATIVE mg/dL
Hgb urine dipstick: NEGATIVE
Ketones, ur: NEGATIVE mg/dL
Leukocytes,Ua: NEGATIVE
Nitrite: NEGATIVE
Protein, ur: NEGATIVE mg/dL
Specific Gravity, Urine: 1.015 (ref 1.005–1.030)
pH: 5.5 (ref 5.0–8.0)

## 2021-09-15 MED ORDER — CELECOXIB 200 MG PO CAPS: 200.0000 mg | ORAL_CAPSULE | Freq: Every morning | ORAL | 0 refills | Status: DC

## 2021-09-15 NOTE — Progress Notes (Signed)
09/15/21 3:06 PM   Marjory Lies Tally Joe [birth year] [id number]  CC: Scrotal pain, urinary symptoms, PSA screening  HPI: I saw Mr. Feria today for the above issues.  He has a number of scrotal complaints today, including some occasional discomfort with sitting, as well as his perception that the testicles " hang differently."  He recently underwent a right inguinal hernia repair with Dr. Hampton Abbot just a few weeks ago.  This has been a long-term issue for him, and he reportedly was on a medication in the past that helps, but he is unsure what this was.  Scrotal ultrasound yesterday was benign with no testicular masses, and showed some postoperative changes on the right side after hernia repair.  The referral note mentions some problem with urinary symptoms and that he was previously on Flomax, but he denies urinary symptoms to me today.  He denies any gross hematuria or dysuria.  Urinalysis today is completely benign.  PSA stable and normal at 2.2 in January 2023.   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   COVID 2020   GERD (gastroesophageal reflux disease)    Prostatism 01/13/2015   Sleep apnea     Surgical History: Past Surgical History:  Procedure Laterality Date   COLONOSCOPY  2000   COLONOSCOPY WITH PROPOFOL N/A 08/06/2015   Procedure: COLONOSCOPY WITH PROPOFOL;  Surgeon: Christene Lye, MD;  Location: [facility] ENDOSCOPY;  Service: Endoscopy;  Laterality: N/A;   COLONOSCOPY WITH PROPOFOL N/A 09/02/2021   Procedure: COLONOSCOPY WITH PROPOFOL;  Surgeon: Jonathon Bellows, MD;  Location: [facility] ENDOSCOPY;  Service: Gastroenterology;  Laterality: N/A;   HERNIA REPAIR  [phone]   INSERTION OF MESH  09/03/2021   Procedure: INSERTION OF MESH;  Surgeon: Olean Ree, MD;  Location: [facility] ORS;  Service: General;;   TONSILLECTOMY     UMBILICAL HERNIA REPAIR N/A 09/03/2021   Procedure: HERNIA REPAIR UMBILICAL ADULT,  incarcerated, open;  Surgeon: Olean Ree, MD;  Location: [facility] ORS;  Service: General;  Laterality: N/A;   Family History: Family History  Problem Relation Age of Onset   Arthritis Mother    Colon cancer Father    Prostate cancer Father    Lung cancer Father     Social History:  reports that he has never smoked. He has never used smokeless tobacco. He reports that he does not currently use alcohol. He reports that he does not currently use drugs.  Physical Exam: BP 121/74 (BP Location: Left Arm, Patient Position: Sitting, Cuff Size: Large)    Pulse 84    Ht 5' 10.5" (1.791 m)    Wt 174 lb 9.6 oz (79.2 kg)    BMI 24.70 kg/m    Constitutional:  Alert and oriented, No acute distress. Cardiovascular: No clubbing, cyanosis, or edema. Respiratory: Normal respiratory effort, no increased work of breathing. GI: Abdomen is soft, nontender, nondistended, no abdominal masses GU: Phallus with patent meatus, no lesions, testicles 20 cc and descended bilaterally without masses, nontender   Pertinent Imaging: I have personally viewed and interpreted the scrotal ultrasound and CT abdomen pelvis with contrast from October 2022 that shows no significant urologic abnormalities, normal-appearing prostate, bladder, and testicles without masses.  Assessment & Plan:   66 year old male with some scrotal discomfort of unclear etiology.  All imaging studies and physical exam, as well as urinalysis have been reassuring.  He recently underwent a right hernia repair.  Suspect his symptoms may be  related more to anxiety or pelvic floor dysfunction.  I recommended a trial of Celebrex x2 weeks, and snug fitting underwear.  Consider pelvic floor physical therapy in the future if persistent symptoms.  -Celebrex x2 weeks, recommend sling fitting underwear, behavioral strategies discussed, reassurance provided regarding imaging and laboratory findings -Follow-up with urology as needed, persistent symptoms okay to place  referral to pelvic floor physical therapy   Nickolas Madrid, MD 09/15/2021  [place] [address], [place], [place] [address] [phone]

## 2021-09-15 NOTE — Patient Instructions (Signed)
Pelvic Floor Dysfunction, Male   Pelvic floor dysfunction (PFD) is a condition that results when the group of muscles and connective tissues that support the organs in the pelvis (pelvic floor muscles) do not work well. These muscles and their connections form a sling that supports the colon and bladder. In men, these muscles also support the prostate gland. PFD causes pelvic floor muscles to be too weak, too tight, or both. In PFD, muscle movements are not coordinated. This may cause bowel or bladder problems. It may also cause pain. What are the causes? This condition may be caused by an injury to the pelvic area or by a weakening of pelvic muscles. In many cases, the exact cause is not known. What increases the risk? The following factors may make you more likely to develop PFD: Having chronic bladder tissue inflammation (interstitial cystitis). Being an older person. Being overweight. History of radiation treatment for cancer in the pelvic region. Previous pelvic surgery, such as removal of the prostate gland (prostatectomy). What are the signs or symptoms? Symptoms of this condition vary and may include: Bladder symptoms, such as: Trouble starting urination and emptying the bladder. Frequent urinary tract infections. Leaking urine when coughing, laughing, or exercising (stress incontinence). Having to pass urine urgently or frequently. Pain when passing urine. Bowel symptoms, such as: Constipation. Urgent or frequent bowel movements. Incomplete bowel movements. Painful bowel movements. Leaking stool or gas. Unexplained genital or rectal pain. Genital or rectal muscle spasms. Low back pain. Sexual dysfunction, such as erectile dysfunction, premature ejaculation, or pain during or after sexual activity. How is this diagnosed? This condition is diagnosed based on: Your symptoms and medical history. A physical exam. During the exam, your health care provider may check your pelvic  muscles for tightness, spasm, pain, or weakness. This may include a rectal exam. In some cases, you may have diagnostic tests, such as: Electrical muscle function tests. Urine flow testing. X-ray tests of bowel function. Ultrasound of the pelvic organs. How is this treated? Treatment for this condition depends on your symptoms. Treatment options include: Physical therapy. This may include Kegel exercises to help relax or strengthen the pelvic floor muscles. Biofeedback. This type of therapy provides feedback on how tight your pelvic floor muscles are so that you can learn to control them. Massage therapy. A treatment that involves electrical stimulation of the pelvic floor muscles to help control pain (transcutaneous electrical nerve stimulation, or TENS). Sound wave therapy (ultrasound) to reduce muscle spasms. Medicines, such as: Muscle relaxants. Bladder control medicines. Surgery to reconstruct or support pelvic floor muscles may be an option if other treatments do not help. Follow these instructions at home: Activity Do your usual activities as told by your health care provider. Ask your health care provider if you should modify any activities. Do pelvic floor strengthening or relaxing exercises at home as told by your physical therapist. Lifestyle Maintain a healthy weight. Eat foods that are high in fiber, such as beans, whole grains, and fresh fruits and vegetables. Limit foods that are high in fat and processed sugars, such as fried or sweet foods. Manage stress with relaxation techniques such as yoga or meditation. General instructions If you have problems with leakage: Use absorbable pads or wear padded underwear. Wash your genital and anal area frequently with mild soap. Keep your genital and anal area as clean and dry as possible. Ask your health care provider if you should try a barrier cream to prevent skin irritation. Take warm baths to relieve  pelvic muscle tension or  spasms. Take over-the-counter and prescription medicines only as told by your health care provider. Keep all follow-up visits. How is this prevented? The cause of PFD is not always known, but there are a few things you can do to reduce the risk of developing this condition, including: Staying at a healthy weight. Getting regular exercise. Managing stress. Contact a health care provider if: Your symptoms are not improving with home care. You have signs or symptoms of PFD that get worse. You develop new signs or symptoms. You have signs of a urinary tract infection, such as: Fever. Chills. Increased urinary frequency. A burning feeling when urinating. You have not had a bowel movement in 3 days (constipation). Summary Pelvic floor dysfunction results when the muscles and connective tissues in your pelvic floor do not work well. These muscles and their connections form a sling that supports your colon and bladder. In men, these muscles also support the prostate gland. PFD may be caused by an injury to the pelvic area or by a weakening of pelvic muscles. PFD causes pelvic floor muscles to be too weak, too tight, or a combination of both. Symptoms may vary from person to person. In most cases, PFD can be treated with physical therapies and medicines. Surgery may be an option if other treatments do not help. This information is not intended to replace advice given to you by your health care provider. Make sure you discuss any questions you have with your health care provider. Document Revised: 11/19/2020 Document Reviewed: 11/19/2020 Elsevier Patient Education  [place].  Pelvic Pain, Male Pelvic pain is pain in your lower abdomen, below your belly button and between your hips. The pain may start suddenly (be acute), keep coming back (recur), or last a long time (become chronic). Pelvic pain that lasts longer than six months is considered chronic. There are many possible causes of  pelvic pain. Sometimes, the cause is not known. Pelvic pain may affect your: Prostate gland. Urinary system. Digestive tract. Musculoskeletal system. Strained muscles or ligaments may cause pelvic pain. Follow these instructions at home: Medicines Take over-the-counter and prescription medicines only as told by your health care provider. If you were prescribed an antibiotic medicine, take it as told by your health care provider. Do not stop taking the antibiotic even if you start to feel better. Managing pain, stiffness, and swelling  Take warm water baths (sitz baths). Sitz baths help with relaxing your pelvic floor muscles. For a sitz bath, the water only comes up to your hips and covers your buttocks. A sitz bath may done at home in a bathtub or with a portable sitz bath that fits over the toilet. If directed, apply heat to the affected area before you exercise. Use the heat source that your health care provider recommends, such as a moist heat pack or a heating pad. Place a towel between your skin and the heat source. Leave the heat on for 20-30 minutes. Remove the heat if your skin turns bright red. This is especially important if you are unable to feel pain, heat, or cold. You may have a greater risk of getting burned. General instructions Rest as told by your health care provider. Keep a journal of your pelvic pain. Write down: When the pain started. Where the pain is located. What seems to make the pain better or worse. Any symptoms you have along with the pain. Follow your treatment plan as told by your health care provider. This  may include: Pelvic physical therapy. Yoga, meditation, and exercise. Biofeedback. This process trains you to manage your body's response (physiological response) through breathing techniques and relaxation methods. You will work with a therapist while machines are used to monitor your physical symptoms. Acupuncture. This is a type of treatment that  involves stimulating specific points on your body by inserting thin needles through your skin to treat pain. Keep all follow-up visits as told by your health care provider. This is important. Contact a health care provider if: Medicine does not help your pain. Your pain comes back. You have new symptoms. You have a fever or chills. You are constipated. You have blood in your urine or stool. You feel weak or light-headed. Get help right away if: You have sudden severe pain. Your pain steadily gets worse. You have severe pain along with fever, nausea, vomiting, or excessive sweating. Summary Pelvic pain is pain in your lower abdomen, below your belly button and between your hips. There are many possible causes of pelvic pain. Sometimes, the cause is not known. Take over-the-counter and prescription medicines only as told by your health care provider. If you were prescribed an antibiotic medicine, take it as told by your health care provider. Do not stop taking the antibiotic even if you start to feel better. Contact a health care provider if you have new or worsening symptoms. Get help right away if you have severe pain along with fever, nausea, vomiting, or excessive sweating. Keep all follow-up visits as told by your health care provider. This is important. This information is not intended to replace advice given to you by your health care provider. Make sure you discuss any questions you have with your health care provider. Document Revised: 11/30/2017 Document Reviewed: 11/30/2017 Elsevier Patient Education  [place].

## 2021-09-18 ENCOUNTER — Encounter: Payer: 59 | Admitting: Surgery

## 2021-09-21 ENCOUNTER — Ambulatory Visit (INDEPENDENT_AMBULATORY_CARE_PROVIDER_SITE_OTHER): Payer: 59 | Admitting: Surgery

## 2021-09-21 ENCOUNTER — Encounter: Payer: Self-pay | Admitting: Surgery

## 2021-09-21 ENCOUNTER — Other Ambulatory Visit: Payer: Self-pay

## 2021-09-21 VITALS — BP 131/82 | HR 88 | Temp 98.6°F | Ht 70.5 in | Wt 175.4 lb

## 2021-09-21 DIAGNOSIS — K429 Umbilical hernia without obstruction or gangrene: Secondary | ICD-10-CM

## 2021-09-21 DIAGNOSIS — K4090 Unilateral inguinal hernia, without obstruction or gangrene, not specified as recurrent: Secondary | ICD-10-CM

## 2021-09-21 DIAGNOSIS — Z09 Encounter for follow-up examination after completed treatment for conditions other than malignant neoplasm: Secondary | ICD-10-CM

## 2021-09-21 NOTE — Patient Instructions (Signed)
Please call our office if you have any questions or concerns.   GENERAL POST-OPERATIVE PATIENT INSTRUCTIONS   WOUND CARE INSTRUCTIONS:  Keep a dry clean dressing on the wound if there is drainage. The initial bandage may be removed after 24 hours.  Once the wound has quit draining you may leave it open to air.  If clothing rubs against the wound or causes irritation and the wound is not draining you may cover it with a dry dressing during the daytime.  Try to keep the wound dry and avoid ointments on the wound unless directed to do so.  If the wound becomes bright red and painful or starts to drain infected material that is not clear, please contact your physician immediately.  If the wound is mildly pink and has a thick firm ridge underneath it, this is normal, and is referred to as a healing ridge.  This will resolve over the next 4-6 weeks.  BATHING: You may shower if you have been informed of this by your surgeon. However, Please do not submerge in a tub, hot tub, or pool until incisions are completely sealed or have been told by your surgeon that you may do so.  DIET:  You may eat any foods that you can tolerate.  It is a good idea to eat a high fiber diet and take in plenty of fluids to prevent constipation.  If you do become constipated you may want to take a mild laxative or take ducolax tablets on a daily basis until your bowel habits are regular.  Constipation can be very uncomfortable, along with straining, after recent surgery.  ACTIVITY:  You are encouraged to cough and deep breath or use your incentive spirometer if you were given one, every 15-30 minutes when awake.  This will help prevent respiratory complications and low grade fevers post-operatively if you had a general anesthetic.  You may want to hug a pillow when coughing and sneezing to add additional support to the surgical area, if you had abdominal or chest surgery, which will decrease pain during these times.  You are encouraged  to walk and engage in light activity for the next two weeks.  You should not lift more than 20 pounds, until 10/15/2021 as it could put you at increased risk for complications.  Twenty pounds is roughly equivalent to a plastic bag of groceries. At that time- Listen to your body when lifting, if you have pain when lifting, stop and then try again in a few days. Soreness after doing exercises or activities of daily living is normal as you get back in to your normal routine.  MEDICATIONS:  Try to take narcotic medications and anti-inflammatory medications, such as tylenol, ibuprofen, naprosyn, etc., with food.  This will minimize stomach upset from the medication.  Should you develop nausea and vomiting from the pain medication, or develop a rash, please discontinue the medication and contact your physician.  You should not drive, make important decisions, or operate machinery when taking narcotic pain medication.  SUNBLOCK Use sun block to incision area over the next year if this area will be exposed to sun. This helps decrease scarring and will allow you avoid a permanent darkened area over your incision.  QUESTIONS:  Please feel free to call our office if you have any questions, and we will be glad to assist you. [phone]

## 2021-09-21 NOTE — Progress Notes (Signed)
09/21/2021  HPI: Daniel Garner is a 66 y.o. male s/p robotic assisted recurrent bilateral inguinal hernia repair and open umbilical hernia repair on 09/03/21.  His right inguinal hernia was incarcerated and the left inguinal hernia was reducible.  The umbilical hernia was also reducible.  Patient presents for follow up.  Reports that he's doing well with some minor irritation in the lower groin areas bilaterally but otherwise no significant pain.  Denies any significant swelling at this point.  He did present to urology on 09/15/2021 with reports of scrotal pain.  He had an ultrasound done which only showed minimally complex bilateral hydroceles and also a small loculated fluid in the right inguinal canal consistent with seroma from his recent surgery.  Vital signs: BP 131/82    Pulse 88    Temp 98.6 F (37 C) (Oral)    Ht 5' 10.5" (1.791 m)    Wt 175 lb 6.4 oz (79.6 kg)    SpO2 95%    BMI 24.81 kg/m    Physical Exam: Constitutional: No acute distress Abdomen: Soft, nondistended, nontender to palpation.  There is minimal swelling at bilateral groin areas.  On palpation there is no evidence of any hernia recurrence.  Incisions are healing well and are clean, dry, intact.  No evidence of recurrent umbilical hernia.  Assessment/Plan: This is a 66 y.o. male s/p robotic assisted bilateral recurrent inguinal hernia repair and open umbilical hernia repair.  - Discussed with patient that symptoms that he is having with the discomfort or irritation of the lower groin areas are likely related to the dissection and inflammation from the surgery itself.  This will continue to improve with time.  On exam, there is no evidence of any hernia recurrence or any complication from the surgery at this point.  Incisions are healing well without any issues. - Patient still has no heavy lifting or pushing restriction for 2 more weeks. - Follow-up as needed.   Melvyn Neth, [facility]

## 2021-10-14 ENCOUNTER — Other Ambulatory Visit: Payer: Self-pay | Admitting: Family Medicine

## 2021-10-14 ENCOUNTER — Ambulatory Visit: Payer: Self-pay | Admitting: *Deleted

## 2021-10-14 DIAGNOSIS — G4709 Other insomnia: Secondary | ICD-10-CM

## 2021-10-14 MED ORDER — TEMAZEPAM 7.5 MG PO CAPS: 7.5000 mg | ORAL_CAPSULE | Freq: Every evening | ORAL | 0 refills | Status: DC | PRN

## 2021-10-14 NOTE — Telephone Encounter (Signed)
Per agent: ?"Pt requested to speak with a nurse regarding temazepam (RESTORIL) 15 MG capsule, pt wanted to get off this medication, please advise." ? ?Chief Complaint: Trying to come off med ?Symptoms: cannot sleep on '15mg'$  ?Frequency:  ?Pertinent Negatives: Patient denies  ?Disposition: '[]'$ ED /'[]'$ Urgent Care (no appt availability in office) / '[]'$ Appointment(In office/virtual)/ '[]'$  [facility]/ '[]'$ Home Care/ '[]'$ Refused Recommended Disposition /'[]'$ [facility]/ '[x]'$  Follow-up with PCP ?Additional Notes: Pt would like Dr. Ancil Boozer to know he has been alternating med '15mg'$ /'30mg'$  and cannot sleep at all on the '15mg'$ . States "Get about 5.5-6hrs of sleep with the '30mg'$ . Questioning if there is an alternate med that is not habit forming. Expresses concern he has been on Temazepam for a long time. Also would like Dr. Ancil Boozer to know he has been taking the Sertraline for 3 weeks and it is helping.  ?Please advise. ? ?Reason for Disposition ? [1] Caller has URGENT medicine question about med that PCP or specialist prescribed AND [2] triager unable to answer question ? ?Answer Assessment - Initial Assessment Questions ?1. NAME of MEDICATION: "What medicine are you calling about?" ?    Temazepam ?2. QUESTION: "What is your question?" (e.g., double dose of medicine, side effect) ?    Any alternate med not habit forming ?3. PRESCRIBING HCP: "Who prescribed it?" Reason: if prescribed by specialist, call should be referred to that group. ?    pcp ? ?Protocols used: Medication Question Call-A-AH ? ?

## 2021-10-15 ENCOUNTER — Telehealth: Payer: Self-pay

## 2021-10-15 NOTE — Telephone Encounter (Signed)
Copied from [place] [id number] [id number]. Topic: General - Other >> Oct 15, 2021 11:32 AM Pawlus, Brayton Layman A wrote: Reason for CRM: Pt spoke with NT yesterday 3/22 and was following up, pt requested a call back with more information regarding his medication, please advise.

## 2021-10-15 NOTE — Telephone Encounter (Signed)
Pt states has more info to talk to Cassandra about, pls return call [phone]. Wants to speak directly about another med that worked?[phone] ?

## 2021-10-16 NOTE — Telephone Encounter (Signed)
Pt is calling back asking for the status of the medication refill.  He is worried because it is getting late in the day and he would like something before the weekend. ?

## 2021-10-16 NOTE — Telephone Encounter (Signed)
Pt made an additional call regarding this Rx refill, pt would like a call back.  ?

## 2021-10-16 NOTE — Telephone Encounter (Signed)
Pt is calling back to state that the temazepam (RESTORIL) 7.5 MG capsule [[id number]] is not approved by his insurance. ? ?Pt states that in the past the '30mg'$  Temazepam was approved by [facility] at [facility]. ? ?Pt states that he is completely out of medication.  ? ?Preferred Pharmacy [facility] or [facility] where the medication can be approved. ? ?CB- [phone] ?

## 2021-10-16 NOTE — Telephone Encounter (Signed)
Notified pt Dr. Ancil Boozer is out of town today and she will refill when she is back in town. Pt states he went ahead and got the 7.'5mg'$  to hold him over until then. ?

## 2021-10-19 ENCOUNTER — Other Ambulatory Visit: Payer: Self-pay

## 2021-10-19 DIAGNOSIS — G4709 Other insomnia: Secondary | ICD-10-CM

## 2021-10-19 MED ORDER — TEMAZEPAM 15 MG PO CAPS: 15.0000 mg | ORAL_CAPSULE | Freq: Every evening | ORAL | 0 refills | Status: DC | PRN

## 2021-10-19 NOTE — Telephone Encounter (Signed)
Pt informed prescription has been sent to pharmacy 

## 2021-10-21 NOTE — Progress Notes (Signed)
? ?Name: Daniel Garner   MRN: [id number]    DOB: 10-Nov-1955   Date:10/22/2021 ? ?     Progress Note ? ?Subjective ? ?Chief Complaint ? ?Discuss Sleep Medicine ? ?I connected with  Evlyn Courier  on 10/22/21 at 10:00 AM EDT by a video enabled telemedicine application and verified that I am speaking with the correct person using two identifiers.  I discussed the limitations of evaluation and management by telemedicine and the availability of in person appointments. The patient expressed understanding and agreed to proceed with the virtual visit  Staff also discussed with the patient that there may be a patient responsible charge related to this service. ?Patient Location: at home  ?Provider Location: [place] ?Additional Individuals present: alone  ? ?HPI ? ?He was seen Summer 2022 with increase of anxiety/depression. He was having difficulty falling and staying asleep. We started him on Trazodone September 22 but he stopped because he felt drowsy in am, since than he tried Seroquel, [place] Distance was given ( not sure if filled ), and Temazepam. He states he is now finishing first bottle of zoloft and his anxiety/depression has improved, he does not want to continue taking a controlled medication for sleep, specially because when he tried going down on the dose of Temazepam he was unable to fall and stay asleep. Discussed options and we will try Hydroxyzine 10-50 mg qhs .  ? ? ? ?Patient Active Problem List  ? Diagnosis Date Noted  ? Atherosclerosis of aorta ([place]) 06/09/2021  ? Lower urinary tract symptoms (LUTS) 06/09/2021  ? Kidney stone on right side 06/09/2021  ? Constipation 04/06/2021  ? Osteoarthritis of right hip 05/27/2017  ? Dyslipidemia 08/22/2016  ? 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 Sleep apnea 08/16/2016  ? History of migraine 08/16/2016  ? ED (erectile dysfunction) 08/16/2016  ? GERD without esophagitis 08/16/2016  ? Prostatism 01/13/2015  ? ? ?Past Surgical History:  ?Procedure Laterality Date  ?  COLONOSCOPY  2000  ? COLONOSCOPY WITH PROPOFOL N/A 08/06/2015  ? Procedure: COLONOSCOPY WITH PROPOFOL;  Surgeon: Christene Lye, MD;  Location: [facility] ENDOSCOPY;  Service: Endoscopy;  Laterality: N/A;  ? COLONOSCOPY WITH PROPOFOL N/A 09/02/2021  ? Procedure: COLONOSCOPY WITH PROPOFOL;  Surgeon: Jonathon Bellows, MD;  Location: [facility] ENDOSCOPY;  Service: Gastroenterology;  Laterality: N/A;  ? HERNIA REPAIR  2007,2012  ? INSERTION OF MESH  09/03/2021  ? Procedure: INSERTION OF MESH;  Surgeon: Olean Ree, MD;  Location: [facility] ORS;  Service: General;;  ? TONSILLECTOMY    ? UMBILICAL HERNIA REPAIR N/A 09/03/2021  ? Procedure: HERNIA REPAIR UMBILICAL ADULT, incarcerated, open;  Surgeon: Olean Ree, MD;  Location: [facility] ORS;  Service: General;  Laterality: N/A;  ? ? ?Family History  ?Problem Relation Age of Onset  ? Arthritis Mother   ? Colon cancer Father   ? Prostate cancer Father   ? Lung cancer Father   ? ? ?Social History  ? ?Socioeconomic History  ? Marital status: Married  ?  Spouse name: Verl Bangs  ? Number of children: 3  ? Years of education: Not on file  ? Highest education level: Some college, no degree  ?Occupational History  ? Occupation: Librarian, academic   ?Tobacco Use  ? Smoking status: Never  ? Smokeless tobacco: Never  ?Vaping Use  ? Vaping Use: Never used  ?Substance and Sexual Activity  ? Alcohol use: Not Currently  ?  Comment: > 36 yrs  ? Drug use: Not Currently  ?  Comment: > 36 yrs  ? Sexual activity: Yes  ?  Partners: Female  ?  Birth control/protection: None  ?Other Topics Concern  ? Not on file  ?Social History Narrative  ? Not on file  ? ?Social Determinants of Health  ? ?Financial Resource Strain: Low Risk   ? Difficulty of Paying Living Expenses: Not very hard  ?Food Insecurity: No Food Insecurity  ? Worried About Charity fundraiser in the Last Year: Never true  ? Ran Out of Food in the Last Year: Never true  ?Transportation Needs: No Transportation Needs  ? Lack of Transportation (Medical): No  ?  Lack of Transportation (Non-Medical): No  ?Physical Activity: Insufficiently Active  ? Days of Exercise per Week: 2 days  ? Minutes of Exercise per Session: 40 min  ?Stress: No Stress Concern Present  ? Feeling of Stress : Only a little  ?Social Connections: Socially Integrated  ? Frequency of Communication with Friends and Family: More than three times a week  ? Frequency of Social Gatherings with Friends and Family: Once a week  ? Attends Religious Services: 1 to 4 times per year  ? Active Member of Clubs or Organizations: Yes  ? Attends Archivist Meetings: 1 to 4 times per year  ? Marital Status: Married  ?Intimate Partner Violence: Not At Risk  ? Fear of Current or Ex-Partner: No  ? Emotionally Abused: No  ? Physically Abused: No  ? Sexually Abused: No  ? ? ? ?Current Outpatient Medications:  ?  Ascorbic Acid (VITAMIN C PO), Take 1 tablet by mouth daily., Disp: , Rfl:  ?  Cyanocobalamin (B-12 PO), Take 1 capsule by mouth daily., Disp: , Rfl:  ?  hydrOXYzine (ATARAX) 10 MG tablet, Take 1-5 tablets (10-50 mg total) by mouth at bedtime as needed., Disp: 60 tablet, Rfl: 0 ?  Omega-3 Fatty Acids (FISH OIL PO), Take 2 capsules by mouth daily., Disp: , Rfl:  ?  omeprazole (PRILOSEC) 40 MG capsule, Take 1 capsule (40 mg total) by mouth daily., Disp: 90 capsule, Rfl: 0 ?  rosuvastatin (CRESTOR) 10 MG tablet, Take 10 mg by mouth daily., Disp: , Rfl:  ?  sertraline (ZOLOFT) 50 MG tablet, Take 50 mg by mouth daily., Disp: , Rfl:  ?  sildenafil (VIAGRA) 25 MG tablet, Take 1 tablet (25 mg total) by mouth daily as needed for erectile dysfunction., Disp: 10 tablet, Rfl: 0 ?  VITAMIN D PO, Take 1 capsule by mouth daily., Disp: , Rfl:  ?  VITAMIN E PO, Take 1-2 capsules by mouth daily., Disp: , Rfl:  ? ?No Known Allergies ? ?I personally reviewed active problem list, medication list, allergies, family history, social history, health maintenance with the patient/caregiver today. ? ? ?ROS ? ?Ten systems reviewed and is  negative except as mentioned in HPI  ? ?Objective ? ?Virtual encounter, vitals not obtained. ? ?There is no height or weight on file to calculate BMI. ? ?Physical Exam ? ?Awake, alert and oriented ? ?PHQ2/9: ? ?  10/22/2021  ?  9:58 AM 09/09/2021  ? 10:48 AM 08/13/2021  ? 10:54 AM 07/30/2021  ? 10:53 AM 06/09/2021  ?  3:14 PM  ?Depression screen PHQ 2/9  ?Decreased Interest 0 1 3 0 1  ?Down, Depressed, Hopeless 0 1 2 0 1  ?PHQ - 2 Score 0 2 5 0 2  ?Altered sleeping 0 3 2 0 3  ?Tired, decreased energy 0 3 2 0 1  ?Change in  appetite 0 0 0 0 2  ?Feeling bad or failure about yourself  0 1 0 0 1  ?Trouble concentrating 0 0 0 0 0  ?Moving slowly or fidgety/restless 0 0 0 0 0  ?Suicidal thoughts 0 0 0 0 0  ?PHQ-9 Score 0 9 9 0 9  ?Difficult doing work/chores Not difficult at all  Very difficult Not difficult at all Somewhat difficult  ? ?PHQ-2/9 Result is negative.   ? ?Fall Risk: ? ?  10/22/2021  ?  9:58 AM 09/21/2021  ?  2:32 PM 09/09/2021  ? 10:47 AM 08/13/2021  ? 10:53 AM 07/30/2021  ? 10:44 AM  ?Fall Risk   ?Falls in the past year? 0 0 0 0 0  ?Number falls in past yr: 0  0 0 0  ?Injury with Fall? 0  0 0 0  ?Risk for fall due to : No Fall Risks  No Fall Risks No Fall Risks   ?Follow up Falls prevention discussed  Falls prevention discussed Falls prevention discussed   ? ? ? ?Assessment & Plan ? ?1. Moderate episode of recurrent major depressive disorder ([place]) ? ?- sertraline (ZOLOFT) 50 MG tablet; Take 50 mg by mouth daily. ? ?2. Other insomnia ? ?- hydrOXYzine (ATARAX) 10 MG tablet; Take 1-5 tablets (10-50 mg total) by mouth at bedtime as needed.  Dispense: 60 tablet; Refill: 0  ? ? ?I discussed the assessment and treatment plan with the patient. The patient was provided an opportunity to ask questions and all were answered. The patient agreed with the plan and demonstrated an understanding of the instructions. ? ?The patient was advised to call back or seek an in-person evaluation if the symptoms worsen or if the condition  fails to improve as anticipated. ? ?I provided 15 minutes of non-face-to-face time during this encounter.  ?

## 2021-10-22 ENCOUNTER — Encounter: Payer: Self-pay | Admitting: Family Medicine

## 2021-10-22 ENCOUNTER — Telehealth (INDEPENDENT_AMBULATORY_CARE_PROVIDER_SITE_OTHER): Payer: 59 | Admitting: Family Medicine

## 2021-10-22 DIAGNOSIS — G4709 Other insomnia: Secondary | ICD-10-CM | POA: Diagnosis not present

## 2021-10-22 DIAGNOSIS — F331 Major depressive disorder, recurrent, moderate: Secondary | ICD-10-CM

## 2021-10-22 DIAGNOSIS — R69 Illness, unspecified: Secondary | ICD-10-CM | POA: Diagnosis not present

## 2021-10-22 MED ORDER — HYDROXYZINE HCL 10 MG PO TABS: 10.0000 mg | ORAL_TABLET | Freq: Every evening | ORAL | 0 refills | Status: DC | PRN

## 2021-11-04 ENCOUNTER — Ambulatory Visit: Payer: Self-pay | Admitting: *Deleted

## 2021-11-04 DIAGNOSIS — G4709 Other insomnia: Secondary | ICD-10-CM

## 2021-11-04 NOTE — Telephone Encounter (Signed)
?  Chief Complaint: insomnia- medication change not helping ?Symptoms: unable to sleep ?Frequency: chronic ?Pertinent Negatives: Patient denies   ?Disposition: '[]'$ ED /'[]'$ Urgent Care (no appt availability in office) / '[]'$ Appointment(In office/virtual)/ '[]'$  [place] [facility]/ '[]'$ Home Care/ '[]'$ Refused Recommended Disposition /'[]'$ [place] [facility]/ '[]'$  Follow-up with PCP ?Additional Notes: Patient was seen in office 3/30 and was given new Rx for sleep because the medication he was taking(trazodone) is habit forming and he wanted to try something different. Patient finished out his old Rx and started new Rx last night- hydroxyzine- patient reports he had to take 3 pills and still did not sleep- calling for help. Advised PCP not in office- he request Dr Ouida Sills review- he has seen her. ?

## 2021-11-04 NOTE — Telephone Encounter (Signed)
Summary: pt stated medication is not working  ? Pt stated the hydrOXYzine (ATARAX) 10 MG tablet is not working and he needs to speak with a nurse. Pt request call back at [phone].   ?  ? ?Reason for Disposition ? [1] Caller has URGENT medicine question about med that PCP or specialist prescribed AND [2] triager unable to answer question ? ?Answer Assessment - Initial Assessment Questions ?1. NAME of MEDICATION: "What medicine are you calling about?" ?    Hydroxyzine not working ?2. QUESTION: "What is your question?" (e.g., double dose of medicine, side effect) ?    Not helping with sleep- requesting alternative ?3. PRESCRIBING HCP: "Who prescribed it?" Reason: if prescribed by specialist, call should be referred to that group. ?    PCP ?4. SYMPTOMS: "Do you have any symptoms?" ?    Insomnia- just started last night- had been finishing other Rx- patient states he did not sleep at all- took 3 pills last night ?5. SEVERITY: If symptoms are present, ask "Are they mild, moderate or severe?" ?    severe ?6. PREGNANCY:  "Is there any chance that you are pregnant?" "When was your last menstrual period?" ? ?Protocols used: Medication Question Call-A-AH ? ?

## 2021-11-05 ENCOUNTER — Ambulatory Visit: Payer: Self-pay | Admitting: *Deleted

## 2021-11-05 ENCOUNTER — Encounter: Payer: Self-pay | Admitting: Family Medicine

## 2021-11-05 NOTE — Telephone Encounter (Signed)
Pt calling again. States he will wiat to speak with Dr. Ancil Boozer but would like Temazepam '15mg'$  called in as that has helped in past, until he can reach out to Dr. Ancil Boozer. If appropriate, [facility] on [address] [phone] ?

## 2021-11-05 NOTE — Telephone Encounter (Signed)
ERROR

## 2021-11-05 NOTE — Telephone Encounter (Signed)
Per agent: ?"Pt stated he did not receive a call back yesterday from a nurse as he requested. Pt requests that a nurse call him back asap. Cb# [phone]" ? ? ?Chief Complaint: Insomnia ?Symptoms: Cannot sleep with Hydroxyzine. Took Temazepam '30mg'$  last night and "Slept through the night" States had "[facility] tabs [facility] but too expensive." ?Frequency:  ?Pertinent Negatives: Patient denies  ?Disposition: '[]'$ ED /'[]'$ Urgent Care (no appt availability in office) / '[]'$ Appointment(In office/virtual)/ '[]'$  [place] [facility]/ '[]'$ Home Care/ '[]'$ Refused Recommended Disposition /'[]'$ [place] [facility]/ '[]'$  Follow-up with PCP ?Additional Notes:  Pt questioning is there is an alternate med for sleep. "One that is not habit forming." Assured pt NT would route to practice for PCPs review. ?Please advise. ?

## 2021-11-05 NOTE — Telephone Encounter (Signed)
Addressed in earlier encounter

## 2021-11-06 ENCOUNTER — Telehealth: Payer: Self-pay

## 2021-11-06 NOTE — Telephone Encounter (Signed)
Copied from [place] [phone]. Topic: General - Other ?>> Nov 06, 2021  4:16 PM Oneta Rack wrote: ?Patient was calling to follow up on 11/04/2021 Nurse Triage encounter, advised patient of what Dr. Rosana Berger noted. Patient will wait until Dr. Ancil Boozer returns in office on Monday 4/17 regarding refilling his medication. ?

## 2021-11-09 ENCOUNTER — Telehealth: Payer: Self-pay | Admitting: Family Medicine

## 2021-11-09 NOTE — Telephone Encounter (Signed)
Requested medication (s) are due for refill today: no  ? ?Requested medication (s) are on the active medication list: yes ? ?Last refill:  unknown ? ?Future visit scheduled: no ? ?Notes to clinic:  Unable to refill per protocol, cannot delegate. Historical  medication. ? ? ? ?  ?Requested Prescriptions  ?Pending Prescriptions Disp Refills  ? temazepam (RESTORIL) 7.5 MG capsule 30 capsule   ?  Sig: Take 1 capsule (7.5 mg total) by mouth at bedtime as needed.  ?  ? Not Delegated - Psychiatry: Anxiolytics/Hypnotics 2 Failed - 11/09/2021 11:28 AM  ?  ?  Failed - This refill cannot be delegated  ?  ?  Failed - Urine Drug Screen completed in last 360 days  ?  ?  Passed - Patient is not pregnant  ?  ?  Passed - Valid encounter within last 6 months  ?  Recent Outpatient Visits   ? ?      ? 2 weeks ago Moderate episode of recurrent major depressive disorder ([place])  ? [facility] [place], Drue Stager, MD  ? 2 months ago Atherosclerosis of aorta [facility])  ? [facility] [place], Drue Stager, MD  ? 2 months ago GERD without esophagitis  ? [facility] [place], Drue Stager, MD  ? 3 months ago Reducible right inguinal hernia  ? [place], DO  ? 5 months ago Well adult exam  ? [facility] Steele Sizer, MD  ? ?  ?  ? ? ?  ?  ?  ?Signed Prescriptions Disp Refills  ? temazepam (RESTORIL) 7.5 MG capsule    ?  Sig: Take 7.5 mg by mouth at bedtime as needed.  ?  ? There is no refill protocol information for this order  ?  ? ? ?

## 2021-11-09 NOTE — Telephone Encounter (Signed)
Pt is calling back to follow up pt is requesting a call back today, if possible.  ?Pt would really like to discuss medication with Dr.Sowles or her nurse.  ? ? ?

## 2021-11-09 NOTE — Telephone Encounter (Signed)
Medication Refill - Medication:  ?temazepam (RESTORIL) 7.5 MG capsule  ? ?Has the patient contacted their pharmacy? Yes.   ?Contact PCP ? ?Preferred Pharmacy (with phone number or street name):  ?[id number] [address], [place] AT [address] [place]  ?[address] [place], [place] [address]  ?Phone:  [phone]  Fax:  [phone]  ? ?Has the patient been seen for an appointment in the last year OR does the patient have an upcoming appointment? Yes ? ?Agent: Please be advised that RX refills may take up to 3 business days. We ask that you follow-up with your pharmacy. ?

## 2021-11-10 ENCOUNTER — Other Ambulatory Visit: Payer: Self-pay | Admitting: Family Medicine

## 2021-11-10 MED ORDER — TEMAZEPAM 7.5 MG PO CAPS
7.5000 mg | ORAL_CAPSULE | Freq: Every evening | ORAL | 0 refills | Status: DC | PRN
Start: 2021-11-10 — End: 2021-12-03

## 2021-11-10 NOTE — Telephone Encounter (Signed)
Patient notified

## 2021-11-10 NOTE — Telephone Encounter (Signed)
Pt called back in. He was on Tenazepam 30 mg and was trying to taper off. He was given '15mg'$  but it was not enough. However this weekend (Saturday and Sunday) he took 7.5 and a '15mg'$  and it worked. He would like to know if possible if Dr Ancil Boozer would give him a prescription for 7.5 mg as he already have some '15mg'$  at home making it equal to 22.'5mg'$  together. [facility]

## 2021-11-10 NOTE — Telephone Encounter (Signed)
Pt made an additional call, pt stated he has been calling multiple times and has not been able to get any assistance with his meds, pt requests a call back.  ?

## 2021-11-10 NOTE — Telephone Encounter (Signed)
Awaiting response from Dr. Ancil Boozer.  ?

## 2021-11-10 NOTE — Telephone Encounter (Signed)
Pt called back to follow up. Pt requesting a call back today, if possible to discuss medication with Dr.Sowles or her nurse ?

## 2021-11-27 ENCOUNTER — Ambulatory Visit: Payer: Self-pay

## 2021-11-27 NOTE — Telephone Encounter (Signed)
Left voicemail to relay message.  ?

## 2021-11-27 NOTE — Telephone Encounter (Signed)
?  Chief Complaint: Medication question ?Symptoms: none ?Frequency:  ?Pertinent Negatives: Patient denies  ?Disposition: '[]'$ ED /'[]'$ Urgent Care (no appt availability in office) / '[]'$ Appointment(In office/virtual)/ '[]'$  [place] [facility]/ '[]'$ Home Care/ '[]'$ Refused Recommended Disposition /'[]'$ [place] [facility]/ '[x]'$  Follow-up with PCP ?Additional Notes: Pt would like to know if it is time to reduce the dose for this medication.  ?On Sunday it will be 2 weeks since he has been on the '15mg'$  dose. Pt states that the first night or 2 it was a little difficult to fall asleep, but now is doing well. ?Pt would like a call back. ? ? ?Summary: reducing dose of temazepam  ? Pt has been taking temazepam and weaning off of it and is now taking '15MG'$  for the last two weeks / he was taking '30MG'$  before / he needs to know if Dr. Ancil Boozer thinks he is ready to go down to 7.'5MG'$  or if he needs to take the '15mg'$  a little bit longer / please advise / pt has enough pills to last until Monday   ?  ? ?Reason for Disposition ? [1] Caller has NON-URGENT medicine question about med that PCP prescribed AND [2] triager unable to answer question ? ?Answer Assessment - Initial Assessment Questions ?1. NAME of MEDICATION: "What medicine are you calling about?" ?    Temazepam ?2. QUESTION: "What is your question?" (e.g., double dose of medicine, side effect) ?    Should dose be reduced now? ?3. PRESCRIBING HCP: "Who prescribed it?" Reason: if prescribed by specialist, call should be referred to that group. ?    Dr. Ancil Boozer ?4. SYMPTOMS: "Do you have any symptoms?" ?     ?5. SEVERITY: If symptoms are present, ask "Are they mild, moderate or severe?" ?     ?6. PREGNANCY:  "Is there any chance that you are pregnant?" "When was your last menstrual period?" ? ?Protocols used: Medication Question Call-A-AH ? ?

## 2021-12-01 NOTE — Telephone Encounter (Signed)
Pt has called and states was to let Dr Ancil Boozer know that the downgrade in dosage went very well as he was to let her know and now he is almost out and wants a refill on this 7.5 again. temazepam (RESTORIL) 7.5 MG capsule ?[id number] [address], [place]  ?[place] [address]  ?Phone: [phone] Fax: [phone]  ?Hours: Not open 24 hours  ? ?

## 2021-12-03 ENCOUNTER — Telehealth: Payer: Self-pay | Admitting: Family Medicine

## 2021-12-03 ENCOUNTER — Other Ambulatory Visit: Payer: Self-pay

## 2021-12-03 NOTE — Telephone Encounter (Signed)
Pt is calling to request  a refill on temazepam ? ?reducing dose of temazepam  ?  Pt has been taking temazepam and weaning off of it and is now taking '15MG'$  for the last two weeks / he was taking '30MG'$  before / he needs to know if Dr. Ancil Boozer thinks he is ready to go down to 7.'5MG'$  or if he needs to take the '15mg'$  a little bit longer / please advise / pt has enough pills to last until Monday   ?  ? ? ?Please advise CB- [phone] ?

## 2021-12-04 ENCOUNTER — Other Ambulatory Visit: Payer: Self-pay | Admitting: Family Medicine

## 2021-12-04 MED ORDER — TEMAZEPAM 7.5 MG PO CAPS
7.5000 mg | ORAL_CAPSULE | Freq: Every evening | ORAL | 0 refills | Status: DC | PRN
Start: 2021-12-04 — End: 2022-02-02

## 2021-12-04 NOTE — Telephone Encounter (Signed)
Spoke with patient and informed him that Dr. Ancil Boozer has sent in his rx for Temazepam 7.'5mg'$  to [place].  Pt verbalized understanding.

## 2021-12-04 NOTE — Telephone Encounter (Signed)
Pt states he is completley out of this medication  ?Temazepam 7.5 mg. ?Pt would like the nurse call to make sure he gets this Rx today. He wants the nurse to call him back asap. ? ? ?

## 2021-12-15 ENCOUNTER — Ambulatory Visit: Payer: Self-pay | Admitting: *Deleted

## 2021-12-15 NOTE — Telephone Encounter (Signed)
I returned pt's call.   He is tapering off of Restoril and needing further advice.  He is at 7.5 mg.   Reason for Disposition  [1] Caller has URGENT medicine question about med that PCP or specialist prescribed AND [2] triager unable to answer question    Pertaining to Restoril taper  Answer Assessment - Initial Assessment Questions 1. NAME of MEDICATION: "What medicine are you calling about?"     Restoril 2. QUESTION: "What is your question?" (e.g., double dose of medicine, side effect)     After the 7.5 mg do I go down lower?   For 2 wks I've taken 7.5 mg.   How long do I need to stay on the 7.5 mg?    First week on 7.5 mg I had nightmares every night for first 4-5 nights.    Not having them.    I'm having headaches too but they are not too bad. 3. PRESCRIBING HCP: "Who prescribed it?" Reason: if prescribed by specialist, call should be referred to that group.     Dr. Ancil Boozer 4. SYMPTOMS: "Do you have any symptoms?"     Headaches.      The nightmares have stopped. 5. SEVERITY: If symptoms are present, ask "Are they mild, moderate or severe?"     "The headaches aren't too bad compared to having migraines which I have sometimes".  6. PREGNANCY:  "Is there any chance that you are pregnant?" "When was your last menstrual period?"     N/A  Protocols used: Medication Question Call-A-AH

## 2021-12-15 NOTE — Telephone Encounter (Signed)
  Chief Complaint: Has questions about tapering down on the Restoril 7.5 mg to a lower dose. Symptoms: He was having bad nightmares the first 4-5 nights on the 7.5 mg but they have stopped.   Having headaches too. Frequency: N/A Pertinent Negatives: Patient denies having any more nightmares.   Disposition: '[]'$ ED /'[]'$ Urgent Care (no appt availability in office) / '[]'$ Appointment(In office/virtual)/ '[]'$  [place] [facility]/ '[]'$ Home Care/ '[]'$ Refused Recommended Disposition /'[]'$ [place] [facility]/ '[x]'$  Follow-up with PCP Additional Notes: Message sent to [facility] for Dr. Ancil Boozer.   Pt agreeable to someone calling him back.

## 2021-12-16 ENCOUNTER — Other Ambulatory Visit: Payer: Self-pay | Admitting: Family Medicine

## 2021-12-16 NOTE — Telephone Encounter (Signed)
Spoke with patient and he is going to try and taper his dosage to completely stop taking the Restoril and is not interested in pursuing any other sleep aid medication at this time. He will follow up with [place] if he has any questions or concerns doing so.

## 2022-02-01 ENCOUNTER — Ambulatory Visit: Payer: Self-pay

## 2022-02-01 ENCOUNTER — Ambulatory Visit: Payer: 59 | Admitting: Nurse Practitioner

## 2022-02-01 NOTE — Telephone Encounter (Signed)
  Chief Complaint: Depression - no motivation to do anything Symptoms: no motivation, nightmares Frequency: Ongoing Pertinent Negatives: Patient denies self harm Disposition: '[]'$ ED /'[]'$ Urgent Care (no appt availability in office) / '[x]'$ Appointment(In office/virtual)/ '[]'$  [facility]/ '[]'$ Home Care/ '[]'$ Refused Recommended Disposition /'[]'$ [facility]/ '[]'$  Follow-up with PCP Additional Notes: Pt recently discontinued Restoril, and is having nightmares since.  PT is having depression where is is finding it difficult yo get out of bed, shower and go to church.  Pt was resistant to appt., but agreed to be seen today.  Summary: Medication advice with dizzines and stomach cramping   Pt is calling to report that he has stopped taking the temazepam (RESTORIL) 7.5 MG capsule [[id number]] for 1 month. Pt reports having bad dreams at night with dizziness, with stomach cramping. Please advise      Reason for Disposition  [1] Depression AND [2] worsening (e.g.,sleeping poorly, less able to do activities of daily living)  Answer Assessment - Initial Assessment Questions 1. CONCERN: "What happened that made you call today?"     Spoke with wife. 2. DEPRESSION SYMPTOM SCREENING: "How are you feeling overall?" (e.g., decreased energy, increased sleeping or difficulty sleeping, difficulty concentrating, feelings of sadness, guilt, hopelessness, or worthlessness)     Doesn't want to get out 3. RISK OF HARM - SUICIDAL IDEATION:  "Do you ever have thoughts of hurting or killing yourself?"  (e.g., yes, no, no but preoccupation with thoughts about death)   - INTENT:  "Do you have thoughts of hurting or killing yourself right NOW?" (e.g., yes, no, N/A)   - PLAN: "Do you have a specific plan for how you would do this?" (e.g., gun, knife, overdose, no plan, N/A)     no 4. RISK OF HARM - HOMICIDAL IDEATION:  "Do you ever have thoughts of hurting or killing someone else?"  (e.g., yes, no, no but preoccupation  with thoughts about death)   - INTENT:  "Do you have thoughts of hurting or killing someone right NOW?" (e.g., yes, no, N/A)   - PLAN: "Do you have a specific plan for how you would do this?" (e.g., gun, knife, no plan, N/A)      na 5. FUNCTIONAL IMPAIRMENT: "How have things been going for you overall? Have you had more difficulty than usual doing your normal daily activities?"  (e.g., better, same, worse; self-care, school, work, interactions)     Yes 6. SUPPORT: "Who is with you now?" "Who do you live with?" "Do you have family or friends who you can talk to?"      Wife, pastor 7. THERAPIST: "Do you have a counselor or therapist? Name?"     no 8. STRESSORS: "Has there been any new stress or recent changes in your life?"     Medication taper 9. ALCOHOL USE OR SUBSTANCE USE (DRUG USE): "Do you drink alcohol or use any illegal drugs?"     no 10. OTHER: "Do you have any other physical symptoms right now?" (e.g., fever)       yes 11. PREGNANCY: "Is there any chance you are pregnant?" "When was your last menstrual period?"       na  Protocols used: Depression-A-AH

## 2022-02-02 ENCOUNTER — Encounter: Payer: Self-pay | Admitting: Family Medicine

## 2022-02-02 ENCOUNTER — Ambulatory Visit (INDEPENDENT_AMBULATORY_CARE_PROVIDER_SITE_OTHER): Payer: 59 | Admitting: Family Medicine

## 2022-02-02 VITALS — BP 124/68 | HR 85 | Temp 98.3°F | Resp 16 | Ht 70.5 in | Wt 186.2 lb

## 2022-02-02 DIAGNOSIS — F418 Other specified anxiety disorders: Secondary | ICD-10-CM

## 2022-02-02 DIAGNOSIS — F330 Major depressive disorder, recurrent, mild: Secondary | ICD-10-CM | POA: Diagnosis not present

## 2022-02-02 DIAGNOSIS — G4709 Other insomnia: Secondary | ICD-10-CM

## 2022-02-02 DIAGNOSIS — R69 Illness, unspecified: Secondary | ICD-10-CM | POA: Diagnosis not present

## 2022-02-02 MED ORDER — BUPROPION HCL ER (SR) 150 MG PO TB12: 150.0000 mg | ORAL_TABLET | Freq: Every day | ORAL | 1 refills | Status: DC

## 2022-02-02 NOTE — Patient Instructions (Signed)
Add wellbutrin to your zoloft in the am

## 2022-02-02 NOTE — Progress Notes (Signed)
Patient ID: Daniel Garner, male    DOB: Jan 11, 1956, 66 y.o.   MRN: [id number]  PCP: Steele Sizer, MD  Chief Complaint  Patient presents with   Follow-up   Depression    Pt has been feeling less motivated, pt states it has been at least a year    Subjective:   Daniel Garner is a 66 y.o. male, presents to clinic with CC of the following:  HPI  Pt new to me, his PCP is out of town, presents with depressed mood and decreased motivation for over a year    02/02/2022    1:22 PM 10/22/2021    9:58 AM 09/09/2021   10:48 AM  Depression screen PHQ 2/9  Decreased Interest 1 0 1  Down, Depressed, Hopeless 1 0 1  PHQ - 2 Score 2 0 2  Altered sleeping 0 0 3  Tired, decreased energy 1 0 3  Change in appetite 0 0 0  Feeling bad or failure about yourself  1 0 1  Trouble concentrating 0 0 0  Moving slowly or fidgety/restless 0 0 0  Suicidal thoughts 0 0 0  PHQ-9 Score 4 0 9  Difficult doing work/chores Somewhat difficult Not difficult at all       02/02/2022    1:23 PM 05/14/2021    3:33 PM 08/16/2018    1:32 PM  GAD 7 : Generalized Anxiety Score  Nervous, Anxious, on Edge 2 1 0  Control/stop worrying 0 1 0  Worry too much - different things 0 1 0  Trouble relaxing 1 1 0  Restless 1 1 0  Easily annoyed or irritable 0 0 0  Afraid - awful might happen 0 1 0  Total GAD 7 Score 4 6 0  Anxiety Difficulty Somewhat difficult Somewhat difficult Not difficult at all   He has previously been prescribed zoloft 50 mg and hydrozyxine prn and temazepam for insomnia  Seen  04/2021 for insomnia and MDD: Depression and Insomnia: He says he has been really concerned about his health and just stressing about everything. His PHQ9 is 13 today.  He says he is having trouble falling asleep for about a month. He says he was taking temazepam but it was not really working for him.  He also tried melatonin with really no success.  Will try trazodone and will follow-up in a month.   Trazodone trial  with 4 week f/up  Sertraline for the past 5+ months, it helped with his anxiety sx, and he feels that is well controlled, but he is having more depressive sx Not as anxious around people Mood is down, appetite is down Feels he is sleeping well - after weaning off temazepam gradually over months -  Got off temazepam and 2 weeks after he developed HA's in the am  He does have several stressors - out of work, needs to get out and look for a job    [facility] Readings from Last 5 Encounters:  02/02/22 186 lb 3.2 oz (84.5 kg)  09/21/21 175 lb 6.4 oz (79.6 kg)  09/15/21 174 lb 9.6 oz (79.2 kg)  09/09/21 175 lb (79.4 kg)  09/03/21 177 lb (80.3 kg)   BMI Readings from Last 5 Encounters:  02/02/22 26.34 kg/m  09/21/21 24.81 kg/m  09/15/21 24.70 kg/m  09/09/21 25.11 kg/m  09/03/21 25.40 kg/m       Patient Active Problem List   Diagnosis Date Noted   Atherosclerosis of aorta ([place]) 06/09/2021  Lower urinary tract symptoms (LUTS) 06/09/2021   Kidney stone on right side 06/09/2021   Constipation 04/06/2021   Osteoarthritis of right hip 05/27/2017   Dyslipidemia 08/22/2016   Chondromalacia of knee, 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   Sleep apnea 08/16/2016   History of migraine 08/16/2016   ED (erectile dysfunction) 08/16/2016   GERD without esophagitis 08/16/2016   Prostatism 01/13/2015      Current Outpatient Medications:    Ascorbic Acid (VITAMIN C PO), Take 1 tablet by mouth daily., Disp: , Rfl:    Cyanocobalamin (B-12 PO), Take 1 capsule by mouth daily., Disp: , Rfl:    hydrOXYzine (ATARAX) 10 MG tablet, Take 1-5 tablets (10-50 mg total) by mouth at bedtime as needed., Disp: 60 tablet, Rfl: 0   Omega-3 Fatty Acids (FISH OIL PO), Take 2 capsules by mouth daily., Disp: , Rfl:    omeprazole (PRILOSEC) 40 MG capsule, Take 1 capsule (40 mg total) by mouth daily., Disp: 90 capsule, Rfl: 0   rosuvastatin (CRESTOR) 10 MG tablet, Take 10 mg by mouth daily., Disp: , Rfl:    sertraline (ZOLOFT) 50 MG  tablet, Take 50 mg by mouth daily., Disp: , Rfl:    sildenafil (VIAGRA) 25 MG tablet, Take 1 tablet (25 mg total) by mouth daily as needed for erectile dysfunction., Disp: 10 tablet, Rfl: 0   VITAMIN D PO, Take 1 capsule by mouth daily., Disp: , Rfl:    VITAMIN E PO, Take 1-2 capsules by mouth daily., Disp: , Rfl:    temazepam (RESTORIL) 7.5 MG capsule, Take 1 capsule (7.5 mg total) by mouth at bedtime as needed. (Patient not taking: Reported on 02/02/2022), Disp: 30 capsule, Rfl: 0   No Known Allergies   Social History   Tobacco Use   Smoking status: Never   Smokeless tobacco: Never  Vaping Use   Vaping Use: Never used  Substance Use Topics   Alcohol use: Not Currently    Comment: > 36 yrs   Drug use: Not Currently    Comment: > 38 yrs      Chart Review Today: I personally reviewed active problem list, medication list, allergies, family history, social history, health maintenance, notes from last encounter, lab results, imaging with the patient/caregiver today.   Review of Systems  Constitutional: Negative.   HENT: Negative.    Eyes: Negative.   Respiratory: Negative.    Cardiovascular: Negative.   Gastrointestinal: Negative.   Endocrine: Negative.   Genitourinary: Negative.   Musculoskeletal: Negative.   Skin: Negative.   Allergic/Immunologic: Negative.   Neurological: Negative.   Hematological: Negative.   Psychiatric/Behavioral: Negative.    All other systems reviewed and are negative.      Objective:   Vitals:   02/02/22 1325  BP: 124/68  Pulse: 85  Resp: 16  Temp: 98.3 F (36.8 C)  TempSrc: Oral  SpO2: 94%  Weight: 186 lb 3.2 oz (84.5 kg)  Height: 5' 10.5" (1.791 m)    Body mass index is 26.34 kg/m.  Physical Exam Vitals and nursing note reviewed.  Constitutional:      General: He is not in acute distress.    Appearance: Normal appearance. He is well-developed, well-groomed and normal weight. He is not ill-appearing, toxic-appearing or  diaphoretic.  HENT:     Head: Normocephalic and atraumatic.     Nose: Nose normal.  Eyes:     General:        Right eye: No discharge.        Left eye:  No discharge.     Conjunctiva/sclera: Conjunctivae normal.  Neck:     Trachea: No tracheal deviation.  Cardiovascular:     Rate and Rhythm: Normal rate and regular rhythm.  Pulmonary:     Effort: Pulmonary effort is normal. No respiratory distress.     Breath sounds: No stridor.  Musculoskeletal:        General: Normal range of motion.  Skin:    General: Skin is warm and dry.     Findings: No rash.  Neurological:     Mental Status: He is alert.     Motor: No abnormal muscle tone.     Coordination: Coordination normal.  Psychiatric:        Attention and Perception: Attention and perception normal.        Mood and Affect: Mood is anxious and depressed.        Speech: Speech normal.        Behavior: Behavior normal. Behavior is cooperative.        Thought Content: Thought content normal.      Results for orders placed or performed during the hospital encounter of 09/15/21  Urinalysis, Complete w Microscopic  Result Value Ref Range   Color, Urine YELLOW YELLOW   APPearance CLEAR CLEAR   Specific Gravity, Urine 1.015 1.005 - 1.030   pH 5.5 5.0 - 8.0   Glucose, UA NEGATIVE NEGATIVE mg/dL   Hgb urine dipstick NEGATIVE NEGATIVE   Bilirubin Urine NEGATIVE NEGATIVE   Ketones, ur NEGATIVE NEGATIVE mg/dL   Protein, ur NEGATIVE NEGATIVE mg/dL   Nitrite NEGATIVE NEGATIVE   Leukocytes,Ua NEGATIVE NEGATIVE   Squamous Epithelial / LPF 0-5 0 - 5   WBC, UA 0-5 0 - 5 WBC/hpf   RBC / HPF 0-5 0 - 5 RBC/hpf   Bacteria, UA NONE SEEN NONE SEEN       Assessment & Plan:     ICD-10-CM   1. Mild episode of recurrent major depressive disorder (HCC)  F33.0 buPROPion (WELLBUTRIN SR) 150 MG 12 hr tablet    TSH   phq score 4 - he expresses lack of motivation, down mood, screen TSH, trial of adding wellbutrin to current zoloft med    2.  Anxiety about health  F41.8    some expressed GAD and social anxiety - better with sertraline, will keep med the same, encouraged him to work with psychiatry/therapist    3. Other insomnia  G47.09    feels hes sleeping well after tapering off meds, but despite that energy/mood/motivation down      4-6 week f/up with me or PCP for med check SE of wellbutrin explained thoroughly to pt, encouraged to take upon waking, warned some pt find it activating or can have some increase anxiety or insomnia  Will try adding this instead of changing SSRI  Again encouraged est with psych    Delsa Grana, PA-C 02/02/22 1:33 PM

## 2022-02-03 LAB — TSH: TSH: 0.91 mIU/L (ref 0.40–4.50)

## 2022-02-14 ENCOUNTER — Other Ambulatory Visit: Payer: Self-pay | Admitting: Family Medicine

## 2022-02-14 DIAGNOSIS — F331 Major depressive disorder, recurrent, moderate: Secondary | ICD-10-CM

## 2022-02-22 ENCOUNTER — Other Ambulatory Visit: Payer: Self-pay | Admitting: Family Medicine

## 2022-02-22 DIAGNOSIS — F331 Major depressive disorder, recurrent, moderate: Secondary | ICD-10-CM

## 2022-02-22 MED ORDER — SERTRALINE HCL 50 MG PO TABS: 75.0000 mg | ORAL_TABLET | Freq: Every day | ORAL | 0 refills | Status: DC

## 2022-02-22 NOTE — Progress Notes (Signed)
Med dose changed and refilled - zoloft D/c wellbutrin Pt has f/up with PCP

## 2022-03-14 ENCOUNTER — Other Ambulatory Visit: Payer: Self-pay | Admitting: Family Medicine

## 2022-03-14 DIAGNOSIS — F331 Major depressive disorder, recurrent, moderate: Secondary | ICD-10-CM

## 2022-03-16 NOTE — Progress Notes (Unsigned)
Name: Daniel Garner   MRN: [id number]    DOB: 02/04/1956   Date:03/17/2022       Progress Note  Subjective  Chief Complaint  Medication Follow-Up  HPI  MDD: he is currently only taking 50 mg of Zoloft, able to wean self off Temazepam but over the past two months  he has been noticing vivid dreams, he woke up once on his floor because he was dreaming about a football gave and leaped to do a touchdown, talking during his sleep and wakes his wife up. He has been walking up during the night but able to fall back asleep quickly most of the time. He was seen by Delsa Grana and was advised to add wellbutrin but he states it kept him up at night , she advised him to go up on sertraline to 75 mg but he has not adjusted the dose yet. He skipped two doses of sertraline last week and could not sleep the second night. He has lack of motivation, does not want to get out of bed, has not been working out, states lately does not even have the motivation to go to church. He denies suicidal thoughts or ideation Stress factor - lost his job and not working/affecting them financially   Insomnia: he has tried seroquel, hdyroxizine, quiviqui, Temazepam and Trazodone.   Headaches: episodes of headaches started 3 weeks ago ( one week prior to falling off his bed while asleep) , pain is described as nagging and aching pain, not severe enough to take medications, usually frontal and sometimes on nuchal area, it resolves within hours , does not wake him up during the night   Dizziness: he also noticed episodes about 3 weeks ago , he states it happened this am, he does not know the triggers, feels lightheaded even when sitting down , it is intermittent. No other neuro deficits.   Atherosclerosis of aorta: he stopped taking crestor on his own    Patient Active Problem List   Diagnosis Date Noted   Vivid dream 03/17/2022   Mild episode of recurrent major depressive disorder ([place]) 03/17/2022   Atherosclerosis of aorta  ([place]) 06/09/2021   Lower urinary tract symptoms (LUTS) 06/09/2021   Kidney stone on right side 06/09/2021   Constipation 04/06/2021   Osteoarthritis of right hip 05/27/2017   Dyslipidemia 08/22/2016   Chondromalacia of knee, 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   Sleep apnea 08/16/2016   History of migraine 08/16/2016   ED (erectile dysfunction) 08/16/2016   GERD without esophagitis 08/16/2016   Prostatism 01/13/2015    Past Surgical History:  Procedure Laterality Date   COLONOSCOPY  2000   COLONOSCOPY WITH PROPOFOL N/A 08/06/2015   Procedure: COLONOSCOPY WITH PROPOFOL;  Surgeon: Christene Lye, MD;  Location: [facility] ENDOSCOPY;  Service: Endoscopy;  Laterality: N/A;   COLONOSCOPY WITH PROPOFOL N/A 09/02/2021   Procedure: COLONOSCOPY WITH PROPOFOL;  Surgeon: Jonathon Bellows, MD;  Location: [facility] ENDOSCOPY;  Service: Gastroenterology;  Laterality: N/A;   HERNIA REPAIR  [id number]   INSERTION OF MESH  09/03/2021   Procedure: INSERTION OF MESH;  Surgeon: Olean Ree, MD;  Location: [facility] ORS;  Service: General;;   TONSILLECTOMY     UMBILICAL HERNIA REPAIR N/A 09/03/2021   Procedure: HERNIA REPAIR UMBILICAL ADULT, incarcerated, open;  Surgeon: Olean Ree, MD;  Location: [facility] ORS;  Service: General;  Laterality: N/A;    Family History  Problem Relation Age of Onset   Arthritis Mother    Colon cancer Father    Prostate cancer  Father    Lung cancer Father     Social History   Tobacco Use   Smoking status: Never   Smokeless tobacco: Never  Substance Use Topics   Alcohol use: Not Currently    Comment: > 38 yrs     Current Outpatient Medications:    Ascorbic Acid (VITAMIN C PO), Take 1 tablet by mouth daily., Disp: , Rfl:    Cyanocobalamin (B-12 PO), Take 1 capsule by mouth daily., Disp: , Rfl:    Omega-3 Fatty Acids (FISH OIL PO), Take 2 capsules by mouth daily., Disp: , Rfl:    omeprazole (PRILOSEC) 40 MG capsule, Take 1 capsule (40 mg total) by mouth daily., Disp: 90 capsule, Rfl: 0   sertraline  (ZOLOFT) 50 MG tablet, Take 1.5 tablets (75 mg total) by mouth daily., Disp: 135 tablet, Rfl: 0   sildenafil (VIAGRA) 25 MG tablet, Take 1 tablet (25 mg total) by mouth daily as needed for erectile dysfunction., Disp: 10 tablet, Rfl: 0   VITAMIN D PO, Take 1 capsule by mouth daily., Disp: , Rfl:    VITAMIN E PO, Take 1-2 capsules by mouth daily., Disp: , Rfl:   No Known Allergies  I personally reviewed active problem list, medication list, allergies, family history, social history, health maintenance with the patient/caregiver today.   ROS  Constitutional: Negative for fever or weight change.  Respiratory: Negative for cough and shortness of breath.   Cardiovascular: Negative for chest pain or palpitations.  Gastrointestinal: Negative for abdominal pain, no bowel changes.  Musculoskeletal: Negative for gait problem or joint swelling.  Skin: Negative for rash.  Neurological: positive  for dizziness or headache.  No other specific complaints in a complete review of systems (except as listed in HPI above).   Objective  Vitals:   03/17/22 1341  BP: 130/84  Pulse: 82  Resp: 16  SpO2: 98%  Weight: 186 lb (84.4 kg)  Height: '5\' 10"'$  (1.778 m)    Body mass index is 26.69 kg/m.  Physical Exam  Constitutional: Patient appears well-developed and well-nourished.  No distress.  HEENT: head atraumatic, normocephalic, pupils equal and reactive to light, neck supple Cardiovascular: Normal rate, regular rhythm and normal heart sounds.  No murmur heard. No BLE edema. Pulmonary/Chest: Effort normal and breath sounds normal. No respiratory distress. Abdominal: Soft.  There is no tenderness. Psychiatric: Patient has a normal mood and affect. behavior is normal. Judgment and thought content normal.   Recent Results (from the past 2160 hour(s))  TSH     Status: None   Collection Time: 02/02/22  2:17 PM  Result Value Ref Range   TSH 0.91 0.40 - 4.50 mIU/L    PHQ2/9:    03/17/2022    1:45  PM 02/02/2022    1:22 PM 10/22/2021    9:58 AM 09/09/2021   10:48 AM 08/13/2021   10:54 AM  Depression screen PHQ 2/9  Decreased Interest 2 1 0 1 3  Down, Depressed, Hopeless 2 1 0 1 2  PHQ - 2 Score 4 2 0 2 5  Altered sleeping 3 0 0 3 2  Tired, decreased energy 2 1 0 3 2  Change in appetite 0 0 0 0 0  Feeling bad or failure about yourself  1 1 0 1 0  Trouble concentrating 0 0 0 0 0  Moving slowly or fidgety/restless 0 0 0 0 0  Suicidal thoughts 0 0 0 0 0  PHQ-9 Score 10 4 0 9 9  Difficult  doing work/chores  Somewhat difficult Not difficult at all  Very difficult    phq 9 is positive   Fall Risk:    03/17/2022    1:40 PM 02/02/2022    1:21 PM 10/22/2021    9:58 AM 09/21/2021    2:32 PM 09/09/2021   10:47 AM  Fall Risk   Falls in the past year? 0 0 0 0 0  Number falls in past yr: 0 0 0  0  Injury with Fall? 0 0 0  0  Risk for fall due to : No Fall Risks No Fall Risks No Fall Risks  No Fall Risks  Follow up Falls prevention discussed Falls prevention discussed;Education provided Falls prevention discussed  Falls prevention discussed      Functional Status Survey: Is the patient deaf or have difficulty hearing?: No Does the patient have difficulty seeing, even when wearing glasses/contacts?: No Does the patient have difficulty concentrating, remembering, or making decisions?: No Does the patient have difficulty walking or climbing stairs?: No Does the patient have difficulty dressing or bathing?: No Does the patient have difficulty doing errands alone such as visiting a doctor's office or shopping?: No    Assessment & Plan  1. Vivid dream  Referral to psychiatrist   2. Mild episode of recurrent major depressive disorder (HCC)  - busPIRone (BUSPAR) 5 MG tablet; Take 1-2 tablets (5-10 mg total) by mouth 3 (three) times daily as needed.  Dispense: 90 tablet; Refill: 0 - Ambulatory referral to Psychiatry  3. Atherosclerosis of aorta (HCC)  Refuses statin therapy  4.  Anxiety  - busPIRone (BUSPAR) 5 MG tablet; Take 1-2 tablets (5-10 mg total) by mouth 3 (three) times daily as needed.  Dispense: 90 tablet; Refill: 0 - Ambulatory referral to Psychiatry  5. Other insomnia  - Ambulatory referral to Psychiatry

## 2022-03-17 ENCOUNTER — Ambulatory Visit (INDEPENDENT_AMBULATORY_CARE_PROVIDER_SITE_OTHER): Payer: 59 | Admitting: Family Medicine

## 2022-03-17 ENCOUNTER — Encounter: Payer: Self-pay | Admitting: Family Medicine

## 2022-03-17 ENCOUNTER — Other Ambulatory Visit: Payer: Self-pay

## 2022-03-17 ENCOUNTER — Telehealth: Payer: Self-pay | Admitting: Family Medicine

## 2022-03-17 VITALS — BP 130/84 | HR 82 | Resp 16 | Ht 70.0 in | Wt 186.0 lb

## 2022-03-17 DIAGNOSIS — R69 Illness, unspecified: Secondary | ICD-10-CM | POA: Diagnosis not present

## 2022-03-17 DIAGNOSIS — F419 Anxiety disorder, unspecified: Secondary | ICD-10-CM | POA: Diagnosis not present

## 2022-03-17 DIAGNOSIS — F330 Major depressive disorder, recurrent, mild: Secondary | ICD-10-CM | POA: Diagnosis not present

## 2022-03-17 DIAGNOSIS — G4709 Other insomnia: Secondary | ICD-10-CM | POA: Diagnosis not present

## 2022-03-17 DIAGNOSIS — I700 Atherosclerosis of aorta: Secondary | ICD-10-CM | POA: Diagnosis not present

## 2022-03-17 DIAGNOSIS — R6889 Other general symptoms and signs: Secondary | ICD-10-CM | POA: Diagnosis not present

## 2022-03-17 MED ORDER — BUSPIRONE HCL 5 MG PO TABS: 5.0000 mg | ORAL_TABLET | Freq: Three times a day (TID) | ORAL | 0 refills | Status: DC | PRN

## 2022-03-17 NOTE — Addendum Note (Signed)
Addended by: Carlene Coria on: 03/17/2022 02:48 PM   Modules accepted: Orders

## 2022-03-17 NOTE — Telephone Encounter (Signed)
Patient would like for the medication busPIRone to be sent to the [facility] [address] in [place].

## 2022-03-17 NOTE — Telephone Encounter (Signed)
[facility] to [facility]

## 2022-03-18 ENCOUNTER — Other Ambulatory Visit: Payer: Self-pay | Admitting: Family Medicine

## 2022-03-18 DIAGNOSIS — F331 Major depressive disorder, recurrent, moderate: Secondary | ICD-10-CM

## 2022-03-22 ENCOUNTER — Other Ambulatory Visit: Payer: Self-pay | Admitting: Family Medicine

## 2022-03-22 NOTE — Telephone Encounter (Unsigned)
Copied from [place] [id number]. Topic: General - Inquiry >> Mar 22, 2022 11:17 AM Penni Bombard wrote: Reason for CRM: pt wanted to let dr. Ancil Boozer the Buspar is not letting him sleep.  He said he was not taking it anymore.

## 2022-03-24 ENCOUNTER — Telehealth: Payer: Self-pay | Admitting: Family Medicine

## 2022-03-24 NOTE — Telephone Encounter (Signed)
Patient called in stating he has major questions and frustrations about getting his refill on his Sertraline. He needs someone to call him back as soon as possible. Please assist patient further. Patient states he only has 1 pill left and when he doesn't take it he feels bad with headache.

## 2022-03-24 NOTE — Telephone Encounter (Signed)
Called patient and made him aware rx was sent on 02/22/2022 for #[id number] to [facility]. Patient stated [facility] and that he'll call [facility] and pick the rx up.

## 2022-05-20 DIAGNOSIS — F419 Anxiety disorder, unspecified: Secondary | ICD-10-CM | POA: Diagnosis not present

## 2022-05-20 DIAGNOSIS — F5105 Insomnia due to other mental disorder: Secondary | ICD-10-CM | POA: Diagnosis not present

## 2022-05-20 DIAGNOSIS — R69 Illness, unspecified: Secondary | ICD-10-CM | POA: Diagnosis not present

## 2022-06-03 DIAGNOSIS — F419 Anxiety disorder, unspecified: Secondary | ICD-10-CM | POA: Diagnosis not present

## 2022-06-03 DIAGNOSIS — R69 Illness, unspecified: Secondary | ICD-10-CM | POA: Diagnosis not present

## 2022-06-03 DIAGNOSIS — F5105 Insomnia due to other mental disorder: Secondary | ICD-10-CM | POA: Diagnosis not present

## 2022-06-30 ENCOUNTER — Telehealth: Payer: Self-pay | Admitting: Family Medicine

## 2022-06-30 DIAGNOSIS — F5105 Insomnia due to other mental disorder: Secondary | ICD-10-CM | POA: Diagnosis not present

## 2022-06-30 DIAGNOSIS — F419 Anxiety disorder, unspecified: Secondary | ICD-10-CM | POA: Diagnosis not present

## 2022-06-30 DIAGNOSIS — R69 Illness, unspecified: Secondary | ICD-10-CM | POA: Diagnosis not present

## 2022-06-30 LAB — BASIC METABOLIC PANEL
BUN: 11 (ref 4–21)
CO2: 25 — AB (ref 13–22)
Chloride: 99 (ref 99–108)
Creatinine: 1.1 (ref 0.6–1.3)
Glucose: 90
Potassium: 4.5 mEq/L (ref 3.5–5.1)
Sodium: 139 (ref 137–147)

## 2022-06-30 LAB — HEPATIC FUNCTION PANEL
ALT: 38 U/L (ref 10–40)
AST: 27 (ref 14–40)
Alkaline Phosphatase: 127 — AB (ref 25–125)
Bilirubin, Total: 0.5

## 2022-06-30 LAB — CBC AND DIFFERENTIAL
HCT: 45 (ref 41–53)
Hemoglobin: 15.3 (ref 13.5–17.5)
Neutrophils Absolute: 1.9
Platelets: 268 10*3/uL (ref 150–400)
WBC: 4.2

## 2022-06-30 LAB — COMPREHENSIVE METABOLIC PANEL
Albumin: 5 (ref 3.5–5.0)
Calcium: 10.1 (ref 8.7–10.7)
Globulin: 2.3
eGFR: 72

## 2022-06-30 LAB — CBC: RBC: 4.8 (ref 3.87–5.11)

## 2022-06-30 LAB — VITAMIN B12: Vitamin B-12: 862

## 2022-06-30 NOTE — Telephone Encounter (Signed)
Pt stopped by and wanted to inform Dr Ancil Boozer that he just saw Dr Nicolasa Ducking and he is doing much better and says thank you.

## 2022-09-06 ENCOUNTER — Encounter: Payer: Self-pay | Admitting: Family Medicine

## 2022-09-13 ENCOUNTER — Other Ambulatory Visit: Payer: Self-pay | Admitting: Family Medicine

## 2022-09-16 ENCOUNTER — Ambulatory Visit: Payer: 59 | Admitting: Family Medicine

## 2022-09-17 ENCOUNTER — Ambulatory Visit: Payer: 59 | Admitting: Family Medicine

## 2022-09-23 DIAGNOSIS — F1211 Cannabis abuse, in remission: Secondary | ICD-10-CM | POA: Diagnosis not present

## 2022-09-23 DIAGNOSIS — F5105 Insomnia due to other mental disorder: Secondary | ICD-10-CM | POA: Diagnosis not present

## 2022-09-23 DIAGNOSIS — F419 Anxiety disorder, unspecified: Secondary | ICD-10-CM | POA: Diagnosis not present

## 2022-09-23 DIAGNOSIS — F331 Major depressive disorder, recurrent, moderate: Secondary | ICD-10-CM | POA: Diagnosis not present

## 2022-09-27 ENCOUNTER — Encounter: Payer: Self-pay | Admitting: Family Medicine

## 2022-10-12 DIAGNOSIS — M160 Bilateral primary osteoarthritis of hip: Secondary | ICD-10-CM | POA: Diagnosis not present

## 2022-11-08 DIAGNOSIS — F5105 Insomnia due to other mental disorder: Secondary | ICD-10-CM | POA: Diagnosis not present

## 2022-11-08 DIAGNOSIS — F1211 Cannabis abuse, in remission: Secondary | ICD-10-CM | POA: Diagnosis not present

## 2022-11-08 DIAGNOSIS — F331 Major depressive disorder, recurrent, moderate: Secondary | ICD-10-CM | POA: Diagnosis not present

## 2022-11-08 DIAGNOSIS — F419 Anxiety disorder, unspecified: Secondary | ICD-10-CM | POA: Diagnosis not present

## 2022-11-09 ENCOUNTER — Other Ambulatory Visit: Payer: Self-pay

## 2022-11-10 ENCOUNTER — Encounter: Payer: Self-pay | Admitting: Family Medicine

## 2022-11-15 NOTE — Patient Instructions (Signed)

## 2022-11-15 NOTE — Progress Notes (Unsigned)
Name: Daniel Garner   MRN: [id number]    DOB: [birth year]   Date:11/16/2022       Progress Note  Subjective  Chief Complaint  Annual Exam  HPI  Patient presents for annual CPE.  IPSS Questionnaire (AUA-7): Over the past month.   1)  How often have you had a sensation of not emptying your bladder completely after you finish urinating?  0 - Not at all  2)  How often have you had to urinate again less than two hours after you finished urinating? 1 - Less than 1 time in 5  3)  How often have you found you stopped and started again several times when you urinated?  0 - Not at all  4) How difficult have you found it to postpone urination?  0 - Not at all  5) How often have you had a weak urinary stream?  0 - Not at all  6) How often have you had to push or strain to begin urination?  0 - Not at all  7) How many times did you most typically get up to urinate from the time you went to bed until the time you got up in the morning?  1 - 1 time  Total score:  0-7 mildly symptomatic   8-19 moderately symptomatic   20-35 severely symptomatic     Diet: he is eating a balanced diet  Exercise: not very active lately due to hip and right knee pain, seeing Ortho  Last Dental Exam: not in a while  Last Eye Exam: went in 2023   Depression: phq 9 is negative    11/16/2022   10:24 AM 03/17/2022    1:45 PM 02/02/2022    1:22 PM 10/22/2021    9:58 AM 09/09/2021   10:48 AM  Depression screen PHQ 2/9  Decreased Interest 0 2 1 0 1  Down, Depressed, Hopeless 0 2 1 0 1  PHQ - 2 Score 0 4 2 0 2  Altered sleeping 0 3 0 0 3  Tired, decreased energy 0 2 1 0 3  Change in appetite 0 0 0 0 0  Feeling bad or failure about yourself  0 1 1 0 1  Trouble concentrating 0 0 0 0 0  Moving slowly or fidgety/restless 0 0 0 0 0  Suicidal thoughts 0 0 0 0 0  PHQ-9 Score 0 10 4 0 9  Difficult doing work/chores   Somewhat difficult Not difficult at all     Hypertension:  BP Readings from Last 3 Encounters:   11/16/22 132/78  03/17/22 130/84  02/02/22 124/68    Obesity: Wt Readings from Last 3 Encounters:  11/16/22 215 lb (97.5 kg)  03/17/22 186 lb (84.4 kg)  02/02/22 186 lb 3.2 oz (84.5 kg)   BMI Readings from Last 3 Encounters:  11/16/22 30.85 kg/m  03/17/22 26.69 kg/m  02/02/22 26.34 kg/m     Lipids:  Lab Results  Component Value Date   CHOL 211 (H) 07/30/2021   CHOL 214 (H) 10/03/2019   CHOL 204 (H) 08/16/2018   Lab Results  Component Value Date   HDL 55 07/30/2021   HDL 64 10/03/2019   HDL 60 08/16/2018   Lab Results  Component Value Date   LDLCALC 140 (H) 07/30/2021   LDLCALC 136 (H) 10/03/2019   LDLCALC 128 (H) 08/16/2018   Lab Results  Component Value Date   TRIG 67 07/30/2021   TRIG 52 10/03/2019   TRIG 72  08/16/2018   Lab Results  Component Value Date   CHOLHDL 3.8 07/30/2021   CHOLHDL 3.3 10/03/2019   CHOLHDL 3.4 08/16/2018   No results found for: "LDLDIRECT" Glucose:  Glucose, Bld  Date Value Ref Range Status  07/30/2021 92 65 - 99 mg/dL Final    Comment:    .            Fasting reference interval .   05/13/2021 99 70 - 99 mg/dL Final    Comment:    Glucose reference range applies only to samples taken after fasting for at least 8 hours.  03/31/2021 117 (H) 70 - 99 mg/dL Final    Comment:    Glucose reference range applies only to samples taken after fasting for at least 8 hours.    Flowsheet Row Office Visit from 05/14/2021 in [facility]  AUDIT-C Score 0       Married STD testing and prevention (HIV/chl/gon/syphilis): 08/16/18 Sexual history: he has one partner , uses Viagra 25 mg but would like to go up on dose today  Hep C Screening: 08/16/16 Skin cancer: Discussed monitoring for atypical lesions Colorectal cancer: 09/02/21  Prostate cancer:   Lab Results  Component Value Date   PSA 2.20 07/30/2021   PSA 2.4 10/03/2019   PSA 2.4 08/16/2016     Lung cancer:  Low Dose CT Chest recommended if  Age 23-80 years, 30 pack-year currently smoking OR have quit w/in 15years. Patient  no a candidate for screening   AAA: The USPSTF recommends one-time screening with ultrasonography in men ages 60 to 75 years who have ever smoked. Patient   no, a candidate for screening  ECG:  03/31/21  Vaccines:   RSV: discussed with patient  Tdap: up to date Shingrix: discussed with patient  Pneumonia: N/A Flu: did not get it in 2023  COVID-19: up to date  Advanced Care Planning: A voluntary discussion about advance care planning including the explanation and discussion of advance directives.  Discussed health care proxy and Living will, and the patient was able to identify a health care proxy as wife.  Patient does not have a living will and power of attorney of health care   Patient Active Problem List   Diagnosis Date Noted   Vivid dream 03/17/2022   Mild episode of recurrent major depressive disorder 03/17/2022   Atherosclerosis of aorta 06/09/2021   Lower urinary tract symptoms (LUTS) 06/09/2021   Kidney stone on right side 06/09/2021   Constipation 04/06/2021   Osteoarthritis, hip, bilateral 05/27/2017   Dyslipidemia 08/22/2016   Chondromalacia of knee, 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   Sleep apnea 08/16/2016   History of migraine 08/16/2016   ED (erectile dysfunction) 08/16/2016   GERD without esophagitis 08/16/2016   Prostatism 01/13/2015    Past Surgical History:  Procedure Laterality Date   COLONOSCOPY  2000   COLONOSCOPY WITH PROPOFOL N/A 08/06/2015   Procedure: COLONOSCOPY WITH PROPOFOL;  Surgeon: Kieth Brightly, MD;  Location: [facility] ENDOSCOPY;  Service: Endoscopy;  Laterality: N/A;   COLONOSCOPY WITH PROPOFOL N/A 09/02/2021   Procedure: COLONOSCOPY WITH PROPOFOL;  Surgeon: Wyline Mood, MD;  Location: [facility] ENDOSCOPY;  Service: Gastroenterology;  Laterality: N/A;   HERNIA REPAIR  [id number]   INSERTION OF MESH  09/03/2021   Procedure: INSERTION OF MESH;  Surgeon: Henrene Dodge, MD;  Location:  [facility] ORS;  Service: General;;   TONSILLECTOMY     UMBILICAL HERNIA REPAIR N/A 09/03/2021   Procedure: HERNIA REPAIR UMBILICAL ADULT, incarcerated,  open;  Surgeon: Henrene Dodge, MD;  Location: [facility] ORS;  Service: General;  Laterality: N/A;    Family History  Problem Relation Age of Onset   Arthritis Mother    Colon cancer Father    Prostate cancer Father    Lung cancer Father     Social History   Socioeconomic History   Marital status: Married    Spouse name: Roxanne Mins   Number of children: 3   Years of education: Not on file   Highest education level: Some college, no degree  Occupational History   Occupation: Merchandiser, retail   Tobacco Use   Smoking status: Never   Smokeless tobacco: Never  Vaping Use   Vaping Use: Never used  Substance and Sexual Activity   Alcohol use: Not Currently    Comment: > 36 yrs   Drug use: Not Currently    Comment: > 36 yrs   Sexual activity: Yes    Partners: Female    Birth control/protection: None  Other Topics Concern   Not on file  Social History Narrative   Not on file   Social Determinants of Health   Financial Resource Strain: Low Risk  (11/16/2022)   Overall Financial Resource Strain (CARDIA)    Difficulty of Paying Living Expenses: Not very hard  Food Insecurity: No Food Insecurity (11/16/2022)   Hunger Vital Sign    Worried About Running Out of Food in the Last Year: Never true    Ran Out of Food in the Last Year: Never true  Transportation Needs: No Transportation Needs (11/16/2022)   PRAPARE - Administrator, Civil Service (Medical): No    Lack of Transportation (Non-Medical): No  Physical Activity: Sufficiently Active (11/16/2022)   Exercise Vital Sign    Days of Exercise per Week: 3 days    Minutes of Exercise per Session: 60 min  Stress: No Stress Concern Present (11/16/2022)   [employer] of Occupational Health - Occupational Stress Questionnaire    Feeling of Stress : Not at all  Social Connections:  Socially Integrated (11/16/2022)   Social Connection and Isolation Panel [NHANES]    Frequency of Communication with Friends and Family: More than three times a week    Frequency of Social Gatherings with Friends and Family: Twice a week    Attends Religious Services: More than 4 times per year    Active Member of [employer] or Organizations: Yes    Attends Engineer, structural: More than 4 times per year    Marital Status: Married  Catering manager Violence: Not At Risk (11/16/2022)   Humiliation, Afraid, Rape, and Kick questionnaire    Fear of Current or Ex-Partner: No    Emotionally Abused: No    Physically Abused: No    Sexually Abused: No     Current Outpatient Medications:    Ascorbic Acid (VITAMIN C PO), Take 1 tablet by mouth daily., Disp: , Rfl:    Cyanocobalamin (B-12 PO), Take 1 capsule by mouth daily., Disp: , Rfl:    Omega-3 Fatty Acids (FISH OIL PO), Take 2 capsules by mouth daily., Disp: , Rfl:    omeprazole (PRILOSEC) 40 MG capsule, Take 1 capsule (40 mg total) by mouth daily., Disp: 90 capsule, Rfl: 0   VITAMIN D PO, Take 1 capsule by mouth daily., Disp: , Rfl:    VITAMIN E PO, Take 1-2 capsules by mouth daily., Disp: , Rfl:    sildenafil (VIAGRA) 50 MG tablet, Take 1 tablet (50 mg  total) by mouth daily as needed for erectile dysfunction., Disp: 90 tablet, Rfl: 0  No Known Allergies   ROS  Constitutional: Negative for fever or weight change.  Respiratory: Negative for cough and shortness of breath.   Cardiovascular: Negative for chest pain or palpitations.  Gastrointestinal: Negative for abdominal pain, no bowel changes.  Musculoskeletal: positive for gait problem but no joint swelling.  Skin: Negative for rash.  Neurological: Negative for dizziness or headache.  No other specific complaints in a complete review of systems (except as listed in HPI above).    Objective  Vitals:   11/16/22 1024  BP: 132/78  Pulse: 95  Resp: 16  SpO2: 99%  Weight: 215  lb (97.5 kg)  Height: 5\' 10"  (1.778 m)    Body mass index is 30.85 kg/m.  Physical Exam  Constitutional: Patient appears well-developed and well-nourished. No distress.  HENT: Head: Normocephalic and atraumatic. Ears: B TMs ok, no erythema or effusion; Nose: Nose normal. Mouth/Throat: Oropharynx is clear and moist. No oropharyngeal exudate.  Eyes: Conjunctivae and EOM are normal. Pupils are equal, round, and reactive to light. No scleral icterus.  Neck: Normal range of motion. Neck supple. No JVD present. No thyromegaly present.  Cardiovascular: Normal rate, regular rhythm and normal heart sounds.  No murmur heard. No BLE edema. Pulmonary/Chest: Effort normal and breath sounds normal. No respiratory distress. Abdominal: Soft. Bowel sounds are normal, no distension. There is no tenderness. no masses MALE GENITALIA: Normal descended testes bilaterally, no masses palpated, no hernias, no lesions, no discharge RECTAL: Prostate normal size and consistency, no rectal masses or hemorrhoids Musculoskeletal: Normal range of motion, no joint effusions. No gross deformities Neurological: he is alert and oriented to person, place, and time. No cranial nerve deficit. Coordination, balance, strength, speech and gait are normal.  Skin: Skin is warm and dry. No rash noted. No erythema.  Psychiatric: Patient has a normal mood and affect. behavior is normal. Judgment and thought content normal.   Fall Risk:    11/16/2022   10:24 AM 03/17/2022    1:40 PM 02/02/2022    1:21 PM 10/22/2021    9:58 AM 09/21/2021    2:32 PM  Fall Risk   Falls in the past year? 0 0 0 0 0  Number falls in past yr: 0 0 0 0   Injury with Fall? 0 0 0 0   Risk for fall due to : No Fall Risks No Fall Risks No Fall Risks No Fall Risks   Follow up Falls prevention discussed Falls prevention discussed Falls prevention discussed;Education provided Falls prevention discussed      Functional Status Survey: Is the patient deaf or have  difficulty hearing?: No Does the patient have difficulty seeing, even when wearing glasses/contacts?: No Does the patient have difficulty concentrating, remembering, or making decisions?: No Does the patient have difficulty walking or climbing stairs?: Yes Does the patient have difficulty dressing or bathing?: No Does the patient have difficulty doing errands alone such as visiting a doctor's office or shopping?: No    Assessment & Plan  1. Well adult exam  - Lipid panel - COMPLETE METABOLIC PANEL WITH GFR - PSA - Hemoglobin A1c  2. Other male erectile dysfunction  - sildenafil (VIAGRA) 50 MG tablet; Take 1 tablet (50 mg total) by mouth daily as needed for erectile dysfunction.  Dispense: 90 tablet; Refill: 0  3. Atherosclerosis of aorta  - Lipid panel  4. Long-term use of high-risk medication  - COMPLETE  METABOLIC PANEL WITH GFR  5. Lower urinary tract symptoms (LUTS)  - PSA  6. Prostate cancer screening  - PSA  7. Diabetes mellitus screening  - Hemoglobin A1c    -Prostate cancer screening and PSA options (with potential risks and benefits of testing vs not testing) were discussed along with recent recs/guidelines. -USPSTF grade A and B recommendations reviewed with patient; age-appropriate recommendations, preventive care, screening tests, etc discussed and encouraged; healthy living encouraged; see AVS for patient education given to patient -Discussed importance of 150 minutes of physical activity weekly, eat two servings of fish weekly, eat one serving of tree nuts ( cashews, pistachios, pecans, almonds.Marland Kitchen) every other day, eat 6 servings of fruit/vegetables daily and drink plenty of water and avoid sweet beverages.  -Reviewed Health Maintenance: yes

## 2022-11-16 ENCOUNTER — Encounter: Payer: Self-pay | Admitting: Family Medicine

## 2022-11-16 ENCOUNTER — Ambulatory Visit (INDEPENDENT_AMBULATORY_CARE_PROVIDER_SITE_OTHER): Payer: 59 | Admitting: Family Medicine

## 2022-11-16 VITALS — BP 132/78 | HR 95 | Resp 16 | Ht 70.0 in | Wt 215.0 lb

## 2022-11-16 DIAGNOSIS — N528 Other male erectile dysfunction: Secondary | ICD-10-CM | POA: Diagnosis not present

## 2022-11-16 DIAGNOSIS — Z125 Encounter for screening for malignant neoplasm of prostate: Secondary | ICD-10-CM | POA: Diagnosis not present

## 2022-11-16 DIAGNOSIS — Z131 Encounter for screening for diabetes mellitus: Secondary | ICD-10-CM | POA: Diagnosis not present

## 2022-11-16 DIAGNOSIS — R399 Unspecified symptoms and signs involving the genitourinary system: Secondary | ICD-10-CM

## 2022-11-16 DIAGNOSIS — I700 Atherosclerosis of aorta: Secondary | ICD-10-CM | POA: Diagnosis not present

## 2022-11-16 DIAGNOSIS — Z0000 Encounter for general adult medical examination without abnormal findings: Secondary | ICD-10-CM | POA: Diagnosis not present

## 2022-11-16 DIAGNOSIS — Z79899 Other long term (current) drug therapy: Secondary | ICD-10-CM | POA: Diagnosis not present

## 2022-11-16 MED ORDER — SILDENAFIL CITRATE 50 MG PO TABS: 50.0000 mg | ORAL_TABLET | Freq: Every day | ORAL | 0 refills | Status: DC | PRN

## 2022-11-17 LAB — LIPID PANEL
Cholesterol: 205 mg/dL — ABNORMAL HIGH (ref <200)
HDL: 54 mg/dL (ref >=40)
LDL Cholesterol (Calc): 132 mg/dL (calc) — ABNORMAL HIGH
Non-HDL Cholesterol (Calc): 151 mg/dL (calc) — ABNORMAL HIGH (ref <130)
Total CHOL/HDL Ratio: 3.8 (calc) (ref <5.0)
Triglycerides: 87 mg/dL (ref <150)

## 2022-11-17 LAB — COMPLETE METABOLIC PANEL WITH GFR
AG Ratio: 1.8 (calc) (ref 1.0–2.5)
ALT: 28 U/L (ref 9–46)
AST: 25 U/L (ref 10–35)
Albumin: 4.6 g/dL (ref 3.6–5.1)
Alkaline phosphatase (APISO): 89 U/L (ref 35–144)
BUN: 14 mg/dL (ref 7–25)
CO2: 24 mmol/L (ref 20–32)
Calcium: 9.7 mg/dL (ref 8.6–10.3)
Chloride: 106 mmol/L (ref 98–110)
Creat: 1.05 mg/dL (ref 0.70–1.35)
Globulin: 2.5 g/dL (calc) (ref 1.9–3.7)
Glucose, Bld: 72 mg/dL (ref 65–99)
Potassium: 4.1 mmol/L (ref 3.5–5.3)
Sodium: 142 mmol/L (ref 135–146)
Total Bilirubin: 0.5 mg/dL (ref 0.2–1.2)
Total Protein: 7.1 g/dL (ref 6.1–8.1)
eGFR: 79 mL/min/{1.73_m2} (ref >=60)

## 2022-11-17 LAB — HEMOGLOBIN A1C
Hgb A1c MFr Bld: 5.7 % of total Hgb — ABNORMAL HIGH (ref <5.7)
Mean Plasma Glucose: 117 mg/dL
eAG (mmol/L): 6.5 mmol/L

## 2022-11-17 LAB — PSA: PSA: 3.77 ng/mL (ref <=4.00)

## 2022-11-23 DIAGNOSIS — M1611 Unilateral primary osteoarthritis, right hip: Secondary | ICD-10-CM | POA: Diagnosis not present

## 2022-12-12 NOTE — Discharge Instructions (Addendum)
Instructions after Total Hip Replacement   James P. Angie Fava., M.D.    Dept. of Orthopaedics & Sports Medicine [facility] [address] [place], [place]  [address]  Phone: [phone]   Fax: [phone]        [email or web address]        DIET: Drink plenty of non-alcoholic fluids. Resume your normal diet. Include foods high in fiber.  ACTIVITY:  You may use crutches or a walker with weight-bearing as tolerated, unless instructed otherwise. You may be weaned off of the walker or crutches by your Physical Therapist.  Do NOT reach below the level of your knees or cross your legs until allowed.    Continue doing gentle exercises. Exercising will reduce the pain and swelling, increase motion, and prevent muscle weakness.   Please continue to use the TED compression stockings for 6 weeks. You may remove the stockings at night, but should reapply them in the morning. Do not drive or operate any equipment until instructed.  WOUND CARE:  Continue to use ice packs periodically to reduce pain and swelling. Keep the incision clean and dry. You may bathe or shower after the staples are removed at the first office visit following surgery. The Aquacel bandage stays on for 7 days postoperatively. This can be changed out by PT to a honeycomb bandage at this time, or earlier if need be due to drainage.  MEDICATIONS: You may resume your regular medications. Please take the pain medication as prescribed on the medication. Do not take pain medication on an empty stomach. You may have been given a prescription for a blood thinner to prevent blood clots. Please take the medication as instructed. Aspirin 81 mg twice daily. Continue to wear TED hose for 6 weeks on both legs. Pain medications and iron supplements can cause constipation. Use a stool softener (Senokot or Colace) on a daily basis and a laxative (dulcolax or miralax) as needed. Do not drive or drink alcoholic beverages when  taking pain medications.  CALL THE OFFICE FOR: Temperature above 101 degrees Excessive bleeding or drainage on the dressing. Excessive swelling, coldness, or paleness of the toes. Persistent nausea and vomiting.  FOLLOW-UP:  You should have an appointment to return to the office in 6 weeks after surgery. Arrangements have been made for continuation of Physical Therapy (either home therapy or outpatient therapy).     [facility] Department Directory         [email or web address]       [email or web address]          Cardiology  Appointments: [place] - [phone]  Endocrinology  Appointments: [place] [phone] [place] - [phone]  Gastroenterology  Appointments: [place] [phone] [place] - [phone]        General Surgery   Appointments: [facility]  Internal Medicine/Family Medicine  Appointments: [facility] [place] - [phone] [place] - [phone]  Metabolic and Weigh Loss Surgery  Appointments: [facility]        Neurology  Appointments: [place] [phone] [place] - [phone]  Neurosurgery  Appointments: [place]  Obstetrics & Gynecology  Appointments: [place] [phone] [place] - [phone]        Pediatrics  Appointments: [place] [phone] [place] - [phone]  Physiatry  Appointments: [place] [phone]  Physical Therapy  Appointments: [place] - [phone]        Podiatry  Appointments: [place] [phone] [place] - [phone]  Pulmonology  Appointments: [place] [phone]  Rheumatology  Appointments: [place] [phone]        [place] Location: [facility]  [address] [place], [place]  [address]  [place] Location: [facility]. [address] [place], [place]  [address]  [place] Location: [facility] [address] [place], [place]  [address]

## 2022-12-16 ENCOUNTER — Encounter
Admission: RE | Admit: 2022-12-16 | Discharge: 2022-12-16 | Disposition: A | Discharge status: Home / Self Care | Payer: 59 | Source: Ambulatory Visit | Attending: Orthopedic Surgery | Admitting: Orthopedic Surgery

## 2022-12-16 ENCOUNTER — Encounter: Payer: Self-pay | Admitting: Orthopedic Surgery

## 2022-12-16 ENCOUNTER — Other Ambulatory Visit: Payer: Self-pay

## 2022-12-16 VITALS — BP 129/82 | HR 63 | Resp 16 | Ht 70.0 in | Wt 217.6 lb

## 2022-12-16 DIAGNOSIS — M1611 Unilateral primary osteoarthritis, right hip: Secondary | ICD-10-CM | POA: Diagnosis not present

## 2022-12-16 DIAGNOSIS — Z01812 Encounter for preprocedural laboratory examination: Secondary | ICD-10-CM

## 2022-12-16 DIAGNOSIS — Z01818 Encounter for other preprocedural examination: Secondary | ICD-10-CM | POA: Insufficient documentation

## 2022-12-16 DIAGNOSIS — N200 Calculus of kidney: Secondary | ICD-10-CM | POA: Insufficient documentation

## 2022-12-16 HISTORY — DX: Depression, unspecified: F32.A

## 2022-12-16 HISTORY — DX: Personal history of urinary calculi: Z87.442

## 2022-12-16 HISTORY — DX: Anxiety disorder, unspecified: F41.9

## 2022-12-16 LAB — URINALYSIS, ROUTINE W REFLEX MICROSCOPIC
Bilirubin Urine: NEGATIVE
Glucose, UA: NEGATIVE mg/dL
Hgb urine dipstick: NEGATIVE
Ketones, ur: NEGATIVE mg/dL
Leukocytes,Ua: NEGATIVE
Nitrite: NEGATIVE
Protein, ur: NEGATIVE mg/dL
Specific Gravity, Urine: 1.011 (ref 1.005–1.030)
pH: 5 (ref 5.0–8.0)

## 2022-12-16 LAB — CBC
HCT: 46 % (ref 39.0–52.0)
Hemoglobin: 14.9 g/dL (ref 13.0–17.0)
MCH: 31.1 pg (ref 26.0–34.0)
MCHC: 32.4 g/dL (ref 30.0–36.0)
MCV: 96 fL (ref 80.0–100.0)
Platelets: 264 10*3/uL (ref 150–400)
RBC: 4.79 MIL/uL (ref 4.22–5.81)
RDW: 13.2 % (ref 11.5–15.5)
WBC: 4.4 10*3/uL (ref 4.0–10.5)
nRBC: 0 % (ref 0.0–0.2)

## 2022-12-16 LAB — COMPREHENSIVE METABOLIC PANEL
ALT: 20 U/L (ref 0–44)
AST: 23 U/L (ref 15–41)
Albumin: 4.3 g/dL (ref 3.5–5.0)
Alkaline Phosphatase: 85 U/L (ref 38–126)
Anion gap: 8 (ref 5–15)
BUN: 13 mg/dL (ref 8–23)
CO2: 24 mmol/L (ref 22–32)
Calcium: 9.3 mg/dL (ref 8.9–10.3)
Chloride: 106 mmol/L (ref 98–111)
Creatinine, Ser: 1 mg/dL (ref 0.61–1.24)
GFR, Estimated: >60 mL/min (ref >60)
Glucose, Bld: 75 mg/dL (ref 70–99)
Potassium: 3.7 mmol/L (ref 3.5–5.1)
Sodium: 138 mmol/L (ref 135–145)
Total Bilirubin: 0.5 mg/dL (ref 0.3–1.2)
Total Protein: 7.4 g/dL (ref 6.5–8.1)

## 2022-12-16 LAB — SURGICAL PCR SCREEN
MRSA, PCR: NEGATIVE
Staphylococcus aureus: NEGATIVE

## 2022-12-16 LAB — SEDIMENTATION RATE: Sed Rate: 4 mm/hr (ref 0–20)

## 2022-12-16 LAB — C-REACTIVE PROTEIN: CRP: 0.6 mg/dL (ref <1.0)

## 2022-12-16 NOTE — Patient Instructions (Addendum)
Your procedure is scheduled on: 12/27/22 - Monday Report to the Registration Desk on the 1st floor of the [facility]. To find out your arrival time, please call [phone] between 1PM - 3PM on: 12/24/22 - Friday If your arrival time is 6:00 am, do not arrive before that time as the [facility] entrance doors do not open until 6:00 am.  REMEMBER: Instructions that are not followed completely may result in serious medical risk, up to and including death; or upon the discretion of your surgeon and anesthesiologist your surgery may need to be rescheduled.  Do not eat food after midnight the night before surgery.  No gum chewing or hard candies.  You may however, drink CLEAR liquids up to 2 hours before you are scheduled to arrive for your surgery. Do not drink anything within 2 hours of your scheduled arrival time.  Clear liquids include: - water  - apple juice without pulp - gatorade (not RED colors) - black coffee or tea (Do NOT add milk or creamers to the coffee or tea) Do NOT drink anything that is not on this list.   In addition, your doctor has ordered for you to drink the provided:  Ensure Pre-Surgery Clear Carbohydrate Drink  Drinking this carbohydrate drink up to two hours before surgery helps to reduce insulin resistance and improve patient outcomes. Please complete drinking 2 hours before scheduled arrival time.  - beginning 12/20/22 week prior to surgery: Stop Anti-inflammatories (NSAIDS) such as Advil, Aleve, Ibuprofen, Motrin, Naproxen, Naprosyn and Aspirin based products such as Excedrin, Goody's Powder, BC Powder.  Stop beginning 12/20/22, ANY OVER THE COUNTER supplements until after surgery.  You may however, continue to take Tylenol if needed for pain up until the day of surgery.  Continue taking all prescribed medications with the exception of the following:  sildenafil (VIAGRA) hold for 2 days prior to your surgery.   TAKE ONLY THESE MEDICATIONS THE MORNING  OF SURGERY WITH A SIP OF WATER: NONE   No Alcohol for 24 hours before or after surgery.  No Smoking including e-cigarettes for 24 hours before surgery.  No chewable tobacco products for at least 6 hours before surgery.  No nicotine patches on the day of surgery.  Do not use any "recreational" drugs for at least a week (preferably 2 weeks) before your surgery.  Please be advised that the combination of cocaine and anesthesia may have negative outcomes, up to and including death. If you test positive for cocaine, your surgery will be cancelled.  On the morning of surgery brush your teeth with toothpaste and water, you may rinse your mouth with mouthwash if you wish. Do not swallow any toothpaste or mouthwash.  Use CHG Soap or wipes as directed on instruction sheet.  Do not wear jewelry, make-up, hairpins, clips or nail polish.  Do not wear lotions, powders, or perfumes.   Do not shave body hair from the neck down 48 hours before surgery.  Contact lenses, hearing aids and dentures may not be worn into surgery.  Do not bring valuables to the hospital. [facility] is not responsible for any missing/lost belongings or valuables.   Bring your C-PAP to the hospital in case you may have to spend the night.   Notify your doctor if there is any change in your medical condition (cold, fever, infection).  Wear comfortable clothing (specific to your surgery type) to the hospital.  After surgery, you can help prevent lung complications by doing breathing exercises.  Take  deep breaths and cough every 1-2 hours. Your doctor may order a device called an Incentive Spirometer to help you take deep breaths. When coughing or sneezing, hold a pillow firmly against your incision with both hands. This is called "splinting." Doing this helps protect your incision. It also decreases belly discomfort.  If you are being admitted to the hospital overnight, leave your suitcase in the car. After surgery it may  be brought to your room.  In case of increased patient census, it may be necessary for you, the patient, to continue your postoperative care in the Same Day Surgery department.  If you are being discharged the day of surgery, you will not be allowed to drive home. You will need a responsible individual to drive you home and stay with you for 24 hours after surgery.   If you are taking public transportation, you will need to have a responsible individual with you.  Please call the Pre-admissions Testing Dept. at [phone] if you have any questions about these instructions.  Surgery Visitation Policy:  Patients having surgery or a procedure may have two visitors.  Children under the age of 17 must have an adult with them who is not the patient.  Inpatient Visitation:    Visiting hours are 7 a.m. to 8 p.m. Up to four visitors are allowed at one time in a patient room. The visitors may rotate out with other people during the day.  One visitor age 59 or older may stay with the patient overnight and must be in the room by 8 p.m.   Pre-operative 5 CHG Bath Instructions   You can play a key role in reducing the risk of infection after surgery. Your skin needs to be as free of germs as possible. You can reduce the number of germs on your skin by washing with CHG (chlorhexidine gluconate) soap before surgery. CHG is an antiseptic soap that kills germs and continues to kill germs even after washing.   DO NOT use if you have an allergy to chlorhexidine/CHG or antibacterial soaps. If your skin becomes reddened or irritated, stop using the CHG and notify one of our RNs at [phone].   Please shower with the CHG soap starting 4 days before surgery using the following schedule:     Please keep in mind the following:  DO NOT shave, including legs and underarms, starting the day of your first shower.   You may shave your face at any point before/day of surgery.  Place clean sheets on your  bed the day you start using CHG soap. Use a clean washcloth (not used since being washed) for each shower. DO NOT sleep with pets once you start using the CHG.   CHG Shower Instructions:  If you choose to wash your hair and private area, wash first with your normal shampoo/soap.  After you use shampoo/soap, rinse your hair and body thoroughly to remove shampoo/soap residue.  Turn the water OFF and apply about 3 tablespoons (45 ml) of CHG soap to a CLEAN washcloth.  Apply CHG soap ONLY FROM YOUR NECK DOWN TO YOUR TOES (washing for 3-5 minutes)  DO NOT use CHG soap on face, private areas, open wounds, or sores.  Pay special attention to the area where your surgery is being performed.  If you are having back surgery, having someone wash your back for you may be helpful. Wait 2 minutes after CHG soap is applied, then you may rinse off the CHG soap.  Dennie Bible  dry with a clean towel  Put on clean clothes/pajamas   If you choose to wear lotion, please use ONLY the CHG-compatible lotions on the back of this paper.     Additional instructions for the day of surgery: DO NOT APPLY any lotions, deodorants, cologne, or perfumes.   Put on clean/comfortable clothes.  Brush your teeth.  Ask your nurse before applying any prescription medications to the skin.      CHG Compatible Lotions   Aveeno Moisturizing lotion  Cetaphil Moisturizing Cream  Cetaphil Moisturizing Lotion  Clairol Herbal Essence Moisturizing Lotion, Dry Skin  Clairol Herbal Essence Moisturizing Lotion, Extra Dry Skin  Clairol Herbal Essence Moisturizing Lotion, Normal Skin  Curel Age Defying Therapeutic Moisturizing Lotion with Alpha Hydroxy  Curel Extreme Care Body Lotion  Curel Soothing Hands Moisturizing Hand Lotion  Curel Therapeutic Moisturizing Cream, Fragrance-Free  Curel Therapeutic Moisturizing Lotion, Fragrance-Free  Curel Therapeutic Moisturizing Lotion, Original Formula  Eucerin Daily Replenishing Lotion  Eucerin Dry  Skin Therapy Plus Alpha Hydroxy Crme  Eucerin Dry Skin Therapy Plus Alpha Hydroxy Lotion  Eucerin Original Crme  Eucerin Original Lotion  Eucerin Plus Crme Eucerin Plus Lotion  Eucerin TriLipid Replenishing Lotion  Keri Anti-Bacterial Hand Lotion  Keri Deep Conditioning Original Lotion Dry Skin Formula Softly Scented  Keri Deep Conditioning Original Lotion, Fragrance Free Sensitive Skin Formula  Keri Lotion Fast Absorbing Fragrance Free Sensitive Skin Formula  Keri Lotion Fast Absorbing Softly Scented Dry Skin Formula  Keri Original Lotion  Keri Skin Renewal Lotion Keri Silky Smooth Lotion  Keri Silky Smooth Sensitive Skin Lotion  Nivea Body Creamy Conditioning Oil  Nivea Body Extra Enriched Lotion  Nivea Body Original Lotion  Nivea Body Sheer Moisturizing Lotion Nivea Crme  Nivea Skin Firming Lotion  NutraDerm 30 Skin Lotion  NutraDerm Skin Lotion  NutraDerm Therapeutic Skin Cream  NutraDerm Therapeutic Skin Lotion  ProShield Protective Hand Cream  Provon moisturizing lotion How to Use an Incentive Spirometer  An incentive spirometer is a tool that measures how well you are filling your lungs with each breath. Learning to take long, deep breaths using this tool can help you keep your lungs clear and active. This may help to reverse or lessen your chance of developing breathing (pulmonary) problems, especially infection. You may be asked to use a spirometer: After a surgery. If you have a lung problem or a history of smoking. After a long period of time when you have been unable to move or be active. If the spirometer includes an indicator to show the highest number that you have reached, your health care provider or respiratory therapist will help you set a goal. Keep a log of your progress as told by your health care provider. What are the risks? Breathing too quickly may cause dizziness or cause you to pass out. Take your time so you do not get dizzy or light-headed. If you  are in pain, you may need to take pain medicine before doing incentive spirometry. It is harder to take a deep breath if you are having pain. How to use your incentive spirometer  Sit up on the edge of your bed or on a chair. Hold the incentive spirometer so that it is in an upright position. Before you use the spirometer, breathe out normally. Place the mouthpiece in your mouth. Make sure your lips are closed tightly around it. Breathe in slowly and as deeply as you can through your mouth, causing the piston or the ball to rise toward the  top of the chamber. Hold your breath for 3-5 seconds, or for as long as possible. If the spirometer includes a coach indicator, use this to guide you in breathing. Slow down your breathing if the indicator goes above the marked areas. Remove the mouthpiece from your mouth and breathe out normally. The piston or ball will return to the bottom of the chamber. Rest for a few seconds, then repeat the steps 10 or more times. Take your time and take a few normal breaths between deep breaths so that you do not get dizzy or light-headed. Do this every 1-2 hours when you are awake. If the spirometer includes a goal marker to show the highest number you have reached (best effort), use this as a goal to work toward during each repetition. After each set of 10 deep breaths, cough a few times. This will help to make sure that your lungs are clear. If you have an incision on your chest or abdomen from surgery, place a pillow or a rolled-up towel firmly against the incision when you cough. This can help to reduce pain while taking deep breaths and coughing. General tips When you are able to get out of bed: Walk around often. Continue to take deep breaths and cough in order to clear your lungs. Keep using the incentive spirometer until your health care provider says it is okay to stop using it. If you have been in the hospital, you may be told to keep using the spirometer at  home. Contact a health care provider if: You are having difficulty using the spirometer. You have trouble using the spirometer as often as instructed. Your pain medicine is not giving enough relief for you to use the spirometer as told. You have a fever. Get help right away if: You develop shortness of breath. You develop a cough with bloody mucus from the lungs. You have fluid or blood coming from an incision site after you cough. Summary An incentive spirometer is a tool that can help you learn to take long, deep breaths to keep your lungs clear and active. You may be asked to use a spirometer after a surgery, if you have a lung problem or a history of smoking, or if you have been inactive for a long period of time. Use your incentive spirometer as instructed every 1-2 hours while you are awake. If you have an incision on your chest or abdomen, place a pillow or a rolled-up towel firmly against your incision when you cough. This will help to reduce pain. Get help right away if you have shortness of breath, you cough up bloody mucus, or blood comes from your incision when you cough. This information is not intended to replace advice given to you by your health care provider. Make sure you discuss any questions you have with your health care provider. Document Revised: 10/01/2019 Document Reviewed: 10/01/2019 Elsevier Patient Education  2023 [employer].

## 2022-12-21 ENCOUNTER — Other Ambulatory Visit: Payer: 59

## 2022-12-21 DIAGNOSIS — M1611 Unilateral primary osteoarthritis, right hip: Secondary | ICD-10-CM | POA: Diagnosis not present

## 2022-12-26 NOTE — H&P (Signed)
ORTHOPAEDIC HISTORY & PHYSICAL Raenette Rover, [place] - 12/21/2022 8:45 AM EDT Formatting of this note is different from the original. NAME: CREED BERANEK H&P Date: 12/21/2022 Procedure Date: 12/27/2022  Chief Complaint: right hip pain  HPI ZYRUS BROCKHOFF is a 67 y.o. male who has severe right hip pain and has failed conservative treatment including NSAID's and activity modification. Patient states that his pain is in both of his hips, however his right hip bothers him more. He is limited in his ADLs, and states that he has been having more trouble with movement of this right hip. He has requested operative intervention for relief of his pain and symptoms. Patient denies having any issues with previous anesthesia, cardiac or pulmonary issues. States that he attended the preoperative classes which he found helpful.  Social Hx: Patient is not currently working, is a non-smoker and does not use illicit drugs  Medications & Allergies Allergies: No Known Allergies  Home Medicines: Current Outpatient Medications on File Prior to Visit Medication Sig Dispense Refill acetaminophen (TYLENOL) 500 MG tablet Take 1,000 mg by mouth every 6 (six) hours as needed ascorbic acid, vitamin C, (VITAMIN C) 500 MG tablet Take 500 mg by mouth once daily.  cholecalciferol (VITAMIN D3) 1,000 unit capsule Take 1,000 Units by mouth once daily.  cyanocobalamin (VITAMIN B12) 1000 MCG tablet Take 1,000 mcg by mouth once daily.  methylsulfonylmethane (MSM) 1,000 mg Tab Take by mouth vitamin E 400 UNIT capsule Take 400 Units by mouth 2 (two) times daily.  ascorbic acid, vitamin C, 550 mg/1.1 gram (scoop) Powd Take by mouth (Patient not taking: Reported on 12/21/2022) omega 3-dha-epa-fish oil 1,000 mg (120 mg-180 mg) Cap Use 1,000 mg once daily. (Patient not taking: Reported on 12/21/2022) sertraline (ZOLOFT) 25 MG tablet Take 25 mg by mouth once daily (Patient not taking: Reported on 12/21/2022)  No current  facility-administered medications on file prior to visit.  Medical / Surgical History  Past Medical History: Diagnosis Date Anxiety BPH with obstruction/lower urinary tract symptoms Chicken pox Depression Erectile dysfunction GERD (gastroesophageal reflux disease) Migraines Osteoarthritis Prostatism Sleep apnea   Past Surgical History: Procedure Laterality Date COLONOSCOPY HERNIA REPAIR TONSILLECTOMY   Physical Exam  Ht:177.8 cm (5\' 10" ) Wt:98.1 kg (216 lb 4.8 oz) BMI: Body mass index is 31.04 kg/m.  General/Constitutional: No apparent distress: well-nourished and well developed. Eyes: Pupils equal, round with synchronous movement. Lymphatic: No palpable adenopathy. Respiratory: Patient has good chest wall movement with inspiration and expiration. Upon auscultation, patient does not have any wheezes, rhonchi or rails appreciated bilaterally. No crackles appreciated at the bases of the lungs Cardiovascular: Patient has a regular rate and rhythm without any appreciable murmurs, heaves, rubs or gallops. Distal posterior tibial pulses appreciated (2+). No edema, swelling or tenderness, except as noted in detailed exam. Integumentary: No impressive skin lesions present, except as noted in detailed exam. Neuro/Psych: Normal mood and affect, oriented to person, place and time. Musculoskeletal: See right hip exam below  Right hip exam Patient has no noticeable deformity or skin changes of his hip noted on exam. Denies having much pain with palpation. Patient has dramaticly reduced range of motion and pain with ROM testing with internal rotation and flexion. Not able to get past 95 degrees of flexion or have more than any internal rotation past neutral. Strength with hip flexion 5/5. Straight leg raise negative. Patient is neurovascularly intact, posterior tibial pulses appreciated 2+.  Imaging Imaging: No imaging was ordered at today's visit, previous imaging of  his right hip was  taken on 10/12/2022. These films showed severe degenerative changes of the patient's hip joints bilaterally.  Data No results found for: "WBC", "HGB", "HCT", "PLT" No results found for: "NA", "K", "CL", "CO2", "BUN", "CREATININE", "GLUCOSE" No results found for: "APTT", "INR" No results found for: "COLORU", "CLARITYU", "SPECGRAV", "PHUR", "PROTEINUR", "GLUCOSEU", "KETONESU", "BLOODU", "NITRITE", "LEUKOCYTESUR", "BILIRUBINUR", "UROBILINOGEN", "RBCUA", "WBCUA", "SQUAMEPI", "CASTUA", "BACTERIA", "UACOMMENT"  Assesment and Plan CC: Right hip pain  I have recommended that Anders Simmonds undergo right total hip arthroplasty. Consents has been signed. The risks, benefits, prognosis and alternatives including but not limited to DVT, PE, infection, neurovascular injury, failure of the procedure and death were explained to the patient and he is willing to proceed with surgery as described to him by myself. Plan will be for post operative admission of at least 1 midnight for pain control and PT. He will be managed with DVT prophylaxis, antibiotics postoperatively for 24 hours and aggressive in patient rehab.  Pre, intra and post op interventions were discussed. Patient has good understanding  Medication Reconciliation was performed. Discussed cessation of vitamins and supplements. Patient does not take any daily medications besides vitamins and supplements  A total of 45 minutes was spent reviewing patient's charts, medical reconciliation, discussing/educating the patient about surgical interventions, and answering any questions provided by the patient.  JOSHUA Kendrick Fries, PA [facility] 12/21/2022 Electronically signed by Raenette Rover, PA at 12/23/2022 8:44 PM EDT

## 2022-12-27 ENCOUNTER — Encounter: Payer: Self-pay | Admitting: Orthopedic Surgery

## 2022-12-27 ENCOUNTER — Ambulatory Visit: Payer: 59 | Admitting: Urgent Care

## 2022-12-27 ENCOUNTER — Observation Stay: Payer: 59

## 2022-12-27 ENCOUNTER — Other Ambulatory Visit: Payer: Self-pay

## 2022-12-27 ENCOUNTER — Encounter
Admission: RE | Disposition: A | Discharge status: Home Health | Payer: Self-pay | Source: Home / Self Care | Attending: Orthopedic Surgery

## 2022-12-27 ENCOUNTER — Observation Stay
Admission: RE | Admit: 2022-12-27 | Discharge: 2022-12-28 | Disposition: A | Discharge status: Home Health | Payer: 59 | Attending: Orthopedic Surgery | Admitting: Orthopedic Surgery

## 2022-12-27 DIAGNOSIS — Z79899 Other long term (current) drug therapy: Secondary | ICD-10-CM | POA: Insufficient documentation

## 2022-12-27 DIAGNOSIS — Z96641 Presence of right artificial hip joint: Secondary | ICD-10-CM

## 2022-12-27 DIAGNOSIS — I10 Essential (primary) hypertension: Secondary | ICD-10-CM | POA: Insufficient documentation

## 2022-12-27 DIAGNOSIS — Z8616 Personal history of COVID-19: Secondary | ICD-10-CM | POA: Diagnosis not present

## 2022-12-27 DIAGNOSIS — Z419 Encounter for procedure for purposes other than remedying health state, unspecified: Secondary | ICD-10-CM

## 2022-12-27 DIAGNOSIS — M1611 Unilateral primary osteoarthritis, right hip: Secondary | ICD-10-CM | POA: Diagnosis not present

## 2022-12-27 DIAGNOSIS — N200 Calculus of kidney: Secondary | ICD-10-CM

## 2022-12-27 DIAGNOSIS — Z01812 Encounter for preprocedural laboratory examination: Secondary | ICD-10-CM

## 2022-12-27 DIAGNOSIS — Z471 Aftercare following joint replacement surgery: Secondary | ICD-10-CM | POA: Diagnosis not present

## 2022-12-27 HISTORY — DX: Atherosclerosis of aorta: I70.0

## 2022-12-27 HISTORY — PX: TOTAL HIP ARTHROPLASTY: SHX124

## 2022-12-27 HISTORY — DX: Hyperlipidemia, unspecified: E78.5

## 2022-12-27 HISTORY — DX: Male erectile dysfunction, unspecified: N52.9

## 2022-12-27 HISTORY — DX: Obstructive sleep apnea (adult) (pediatric): G47.33

## 2022-12-27 HISTORY — DX: Calculus of kidney: N20.0

## 2022-12-27 HISTORY — DX: Essential (primary) hypertension: I10

## 2022-12-27 HISTORY — DX: Migraine, unspecified, not intractable, without status migrainosus: G43.909

## 2022-12-27 LAB — TYPE AND SCREEN
ABO/RH(D): A POS
Antibody Screen: NEGATIVE

## 2022-12-27 LAB — ABO/RH: ABO/RH(D): A POS

## 2022-12-27 SURGERY — ARTHROPLASTY, HIP, TOTAL,POSTERIOR APPROACH
Anesthesia: General | Site: Hip | Laterality: Right

## 2022-12-27 MED ORDER — PANTOPRAZOLE SODIUM 40 MG PO TBEC
40.0000 mg | DELAYED_RELEASE_TABLET | Freq: Two times a day (BID) | ORAL | Status: DC
  Administered 2022-12-28: 40 mg via ORAL

## 2022-12-27 MED ORDER — TRANEXAMIC ACID-NACL 1000-0.7 MG/100ML-% IV SOLN
INTRAVENOUS | Status: AC
  Filled 2022-12-27: qty 100

## 2022-12-27 MED ORDER — ENSURE PRE-SURGERY PO LIQD
296.0000 mL | Freq: Once | ORAL | Status: AC
  Administered 2022-12-27: 296 mL via ORAL
  Filled 2022-12-27: qty 296

## 2022-12-27 MED ORDER — METOCLOPRAMIDE HCL 10 MG PO TABS
ORAL_TABLET | ORAL | Status: AC
  Filled 2022-12-27: qty 1

## 2022-12-27 MED ORDER — CEFAZOLIN SODIUM-DEXTROSE 2-3 GM-%(50ML) IV SOLR
INTRAVENOUS | Status: DC | PRN
  Administered 2022-12-27: 2 g via INTRAVENOUS

## 2022-12-27 MED ORDER — SENNOSIDES-DOCUSATE SODIUM 8.6-50 MG PO TABS
1.0000 | ORAL_TABLET | Freq: Two times a day (BID) | ORAL | Status: DC
  Administered 2022-12-27: 1 via ORAL

## 2022-12-27 MED ORDER — OXYCODONE HCL 5 MG PO TABS
ORAL_TABLET | ORAL | Status: AC
  Filled 2022-12-27: qty 1

## 2022-12-27 MED ORDER — FERROUS SULFATE 325 (65 FE) MG PO TABS
ORAL_TABLET | ORAL | Status: AC
  Filled 2022-12-27: qty 1

## 2022-12-27 MED ORDER — ACETAMINOPHEN 10 MG/ML IV SOLN: 1000.0000 mg | Freq: Once | INTRAVENOUS | Status: DC | PRN

## 2022-12-27 MED ORDER — TRANEXAMIC ACID-NACL 1000-0.7 MG/100ML-% IV SOLN
1000.0000 mg | Freq: Once | INTRAVENOUS | Status: AC
  Administered 2022-12-27: 1000 mg via INTRAVENOUS

## 2022-12-27 MED ORDER — ACETAMINOPHEN 10 MG/ML IV SOLN
INTRAVENOUS | Status: AC
  Filled 2022-12-27: qty 100

## 2022-12-27 MED ORDER — MENTHOL 3 MG MT LOZG: 1.0000 | LOZENGE | OROMUCOSAL | Status: DC | PRN

## 2022-12-27 MED ORDER — OXYCODONE HCL 5 MG PO TABS: 10.0000 mg | ORAL_TABLET | ORAL | Status: DC | PRN

## 2022-12-27 MED ORDER — CEFAZOLIN SODIUM-DEXTROSE 2-4 GM/100ML-% IV SOLN
INTRAVENOUS | Status: AC
  Filled 2022-12-27: qty 100

## 2022-12-27 MED ORDER — ASPIRIN 81 MG PO CHEW
81.0000 mg | CHEWABLE_TABLET | Freq: Two times a day (BID) | ORAL | Status: DC
  Administered 2022-12-27 – 2022-12-28 (×2): 81 mg via ORAL

## 2022-12-27 MED ORDER — ASPIRIN 81 MG PO CHEW
CHEWABLE_TABLET | ORAL | Status: AC
  Filled 2022-12-27: qty 1

## 2022-12-27 MED ORDER — SODIUM CHLORIDE 0.9 % IR SOLN
Status: DC | PRN
  Administered 2022-12-27: 3000 mL

## 2022-12-27 MED ORDER — PHENYLEPHRINE HCL-NACL 20-0.9 MG/250ML-% IV SOLN
INTRAVENOUS | Status: DC | PRN
  Administered 2022-12-27: 40 ug/min via INTRAVENOUS

## 2022-12-27 MED ORDER — CELECOXIB 200 MG PO CAPS
ORAL_CAPSULE | ORAL | Status: AC
  Filled 2022-12-27: qty 2

## 2022-12-27 MED ORDER — TRAMADOL HCL 50 MG PO TABS: 50.0000 mg | ORAL_TABLET | ORAL | Status: DC | PRN

## 2022-12-27 MED ORDER — GABAPENTIN 300 MG PO CAPS
ORAL_CAPSULE | ORAL | Status: AC
  Filled 2022-12-27: qty 1

## 2022-12-27 MED ORDER — MAGNESIUM HYDROXIDE 400 MG/5ML PO SUSP
30.0000 mL | Freq: Every day | ORAL | Status: DC
  Administered 2022-12-28: 30 mL via ORAL

## 2022-12-27 MED ORDER — KETAMINE HCL 10 MG/ML IJ SOLN
INTRAMUSCULAR | Status: DC | PRN
  Administered 2022-12-27: 30 mg via INTRAVENOUS

## 2022-12-27 MED ORDER — SENNOSIDES-DOCUSATE SODIUM 8.6-50 MG PO TABS
ORAL_TABLET | ORAL | Status: AC
  Filled 2022-12-27: qty 1

## 2022-12-27 MED ORDER — ONDANSETRON HCL 4 MG/2ML IJ SOLN
INTRAMUSCULAR | Status: DC | PRN
  Administered 2022-12-27: 4 mg via INTRAVENOUS

## 2022-12-27 MED ORDER — CELECOXIB 200 MG PO CAPS
400.0000 mg | ORAL_CAPSULE | Freq: Once | ORAL | Status: AC
  Administered 2022-12-27: 400 mg via ORAL

## 2022-12-27 MED ORDER — ALUM & MAG HYDROXIDE-SIMETH 200-200-20 MG/5ML PO SUSP: 30.0000 mL | ORAL | Status: DC | PRN

## 2022-12-27 MED ORDER — MIDAZOLAM HCL 5 MG/5ML IJ SOLN
INTRAMUSCULAR | Status: DC | PRN
  Administered 2022-12-27: 2 mg via INTRAVENOUS

## 2022-12-27 MED ORDER — HYDROMORPHONE HCL 1 MG/ML IJ SOLN: 0.5000 mg | INTRAMUSCULAR | Status: DC | PRN

## 2022-12-27 MED ORDER — ONDANSETRON HCL 4 MG/2ML IJ SOLN
INTRAMUSCULAR | Status: AC
  Filled 2022-12-27: qty 2

## 2022-12-27 MED ORDER — PHENOL 1.4 % MT LIQD: 1.0000 | OROMUCOSAL | Status: DC | PRN

## 2022-12-27 MED ORDER — FAMOTIDINE 20 MG PO TABS
ORAL_TABLET | ORAL | Status: AC
  Filled 2022-12-27: qty 1

## 2022-12-27 MED ORDER — FENTANYL CITRATE (PF) 100 MCG/2ML IJ SOLN: 25.0000 ug | INTRAMUSCULAR | Status: DC | PRN

## 2022-12-27 MED ORDER — ORAL CARE MOUTH RINSE: 15.0000 mL | Freq: Once | OROMUCOSAL | Status: AC

## 2022-12-27 MED ORDER — DIPHENHYDRAMINE HCL 12.5 MG/5ML PO ELIX: 12.5000 mg | ORAL_SOLUTION | ORAL | Status: DC | PRN

## 2022-12-27 MED ORDER — OXYCODONE HCL 5 MG/5ML PO SOLN: 5.0000 mg | Freq: Once | ORAL | Status: DC | PRN

## 2022-12-27 MED ORDER — CHLORHEXIDINE GLUCONATE 4 % EX SOLN
60.0000 mL | Freq: Once | CUTANEOUS | Status: AC
  Administered 2022-12-27: 4 via TOPICAL

## 2022-12-27 MED ORDER — 0.9 % SODIUM CHLORIDE (POUR BTL) OPTIME
TOPICAL | Status: DC | PRN
  Administered 2022-12-27: 500 mL

## 2022-12-27 MED ORDER — PROPOFOL 10 MG/ML IV BOLUS
INTRAVENOUS | Status: DC | PRN
  Administered 2022-12-27: 30 mg via INTRAVENOUS

## 2022-12-27 MED ORDER — ACETAMINOPHEN 10 MG/ML IV SOLN
1000.0000 mg | Freq: Four times a day (QID) | INTRAVENOUS | Status: DC
  Administered 2022-12-27 – 2022-12-28 (×3): 1000 mg via INTRAVENOUS

## 2022-12-27 MED ORDER — EPHEDRINE SULFATE (PRESSORS) 50 MG/ML IJ SOLN
INTRAMUSCULAR | Status: DC | PRN
  Administered 2022-12-27: 2.5 mg via INTRAVENOUS

## 2022-12-27 MED ORDER — ONDANSETRON HCL 4 MG PO TABS: 4.0000 mg | ORAL_TABLET | Freq: Four times a day (QID) | ORAL | Status: DC | PRN

## 2022-12-27 MED ORDER — PROPOFOL 1000 MG/100ML IV EMUL
INTRAVENOUS | Status: AC
  Filled 2022-12-27: qty 100

## 2022-12-27 MED ORDER — TRANEXAMIC ACID-NACL 1000-0.7 MG/100ML-% IV SOLN
1000.0000 mg | INTRAVENOUS | Status: AC
  Administered 2022-12-27: 1000 mg via INTRAVENOUS

## 2022-12-27 MED ORDER — KETAMINE HCL 50 MG/5ML IJ SOSY
PREFILLED_SYRINGE | INTRAMUSCULAR | Status: AC
  Filled 2022-12-27: qty 5

## 2022-12-27 MED ORDER — DEXAMETHASONE SODIUM PHOSPHATE 10 MG/ML IJ SOLN
INTRAMUSCULAR | Status: AC
  Filled 2022-12-27: qty 1

## 2022-12-27 MED ORDER — CEFAZOLIN SODIUM-DEXTROSE 2-4 GM/100ML-% IV SOLN: 2.0000 g | INTRAVENOUS | Status: DC

## 2022-12-27 MED ORDER — FAMOTIDINE 20 MG PO TABS
20.0000 mg | ORAL_TABLET | Freq: Once | ORAL | Status: AC
  Administered 2022-12-27: 20 mg via ORAL

## 2022-12-27 MED ORDER — DEXAMETHASONE SODIUM PHOSPHATE 10 MG/ML IJ SOLN
8.0000 mg | Freq: Once | INTRAMUSCULAR | Status: AC
  Administered 2022-12-27: 8 mg via INTRAVENOUS

## 2022-12-27 MED ORDER — FERROUS SULFATE 325 (65 FE) MG PO TABS
325.0000 mg | ORAL_TABLET | Freq: Two times a day (BID) | ORAL | Status: DC
  Administered 2022-12-27 – 2022-12-28 (×2): 325 mg via ORAL

## 2022-12-27 MED ORDER — OXYCODONE HCL 5 MG PO TABS
5.0000 mg | ORAL_TABLET | ORAL | Status: DC | PRN
  Administered 2022-12-27 – 2022-12-28 (×2): 5 mg via ORAL

## 2022-12-27 MED ORDER — SODIUM CHLORIDE 0.9 % IV SOLN: INTRAVENOUS | Status: DC

## 2022-12-27 MED ORDER — ONDANSETRON HCL 4 MG/2ML IJ SOLN: 4.0000 mg | Freq: Four times a day (QID) | INTRAMUSCULAR | Status: DC | PRN

## 2022-12-27 MED ORDER — PROPOFOL 10 MG/ML IV BOLUS
INTRAVENOUS | Status: AC
  Filled 2022-12-27: qty 20

## 2022-12-27 MED ORDER — LACTATED RINGERS IV SOLN: INTRAVENOUS | Status: DC

## 2022-12-27 MED ORDER — GABAPENTIN 300 MG PO CAPS
300.0000 mg | ORAL_CAPSULE | Freq: Once | ORAL | Status: AC
  Administered 2022-12-27: 300 mg via ORAL

## 2022-12-27 MED ORDER — ONDANSETRON HCL 4 MG/2ML IJ SOLN: 4.0000 mg | Freq: Once | INTRAMUSCULAR | Status: DC | PRN

## 2022-12-27 MED ORDER — MIDAZOLAM HCL 2 MG/2ML IJ SOLN
INTRAMUSCULAR | Status: AC
  Filled 2022-12-27: qty 2

## 2022-12-27 MED ORDER — PHENYLEPHRINE HCL-NACL 20-0.9 MG/250ML-% IV SOLN
INTRAVENOUS | Status: AC
  Filled 2022-12-27: qty 250

## 2022-12-27 MED ORDER — BUPIVACAINE HCL (PF) 0.5 % IJ SOLN
INTRAMUSCULAR | Status: DC | PRN
  Administered 2022-12-27: 3.2 mL

## 2022-12-27 MED ORDER — SURGIRINSE WOUND IRRIGATION SYSTEM - OPTIME
TOPICAL | Status: DC | PRN
  Administered 2022-12-27: 450 mL

## 2022-12-27 MED ORDER — METOCLOPRAMIDE HCL 10 MG PO TABS
10.0000 mg | ORAL_TABLET | Freq: Three times a day (TID) | ORAL | Status: DC
  Administered 2022-12-27 – 2022-12-28 (×3): 10 mg via ORAL

## 2022-12-27 MED ORDER — CELECOXIB 200 MG PO CAPS
200.0000 mg | ORAL_CAPSULE | Freq: Two times a day (BID) | ORAL | Status: DC
  Administered 2022-12-28: 200 mg via ORAL

## 2022-12-27 MED ORDER — OXYCODONE HCL 5 MG PO TABS: 5.0000 mg | ORAL_TABLET | Freq: Once | ORAL | Status: DC | PRN

## 2022-12-27 MED ORDER — BISACODYL 10 MG RE SUPP: 10.0000 mg | Freq: Every day | RECTAL | Status: DC | PRN

## 2022-12-27 MED ORDER — PROPOFOL 500 MG/50ML IV EMUL
INTRAVENOUS | Status: DC | PRN
  Administered 2022-12-27: 80 ug/kg/min via INTRAVENOUS
  Administered 2022-12-27: 100 ug/kg/min via INTRAVENOUS

## 2022-12-27 MED ORDER — CHLORHEXIDINE GLUCONATE 0.12 % MT SOLN
OROMUCOSAL | Status: AC
  Filled 2022-12-27: qty 15

## 2022-12-27 MED ORDER — CEFAZOLIN SODIUM-DEXTROSE 2-4 GM/100ML-% IV SOLN
2.0000 g | Freq: Four times a day (QID) | INTRAVENOUS | Status: AC
  Administered 2022-12-27 (×2): 2 g via INTRAVENOUS

## 2022-12-27 MED ORDER — EPHEDRINE 5 MG/ML INJ
INTRAVENOUS | Status: AC
  Filled 2022-12-27: qty 5

## 2022-12-27 MED ORDER — CHLORHEXIDINE GLUCONATE 0.12 % MT SOLN
15.0000 mL | Freq: Once | OROMUCOSAL | Status: AC
  Administered 2022-12-27: 15 mL via OROMUCOSAL

## 2022-12-27 MED ORDER — BUPIVACAINE HCL (PF) 0.5 % IJ SOLN
INTRAMUSCULAR | Status: AC
  Filled 2022-12-27: qty 10

## 2022-12-27 MED ORDER — FLEET ENEMA 7-19 GM/118ML RE ENEM: 1.0000 | ENEMA | Freq: Once | RECTAL | Status: DC | PRN

## 2022-12-27 MED ORDER — ACETAMINOPHEN 10 MG/ML IV SOLN
INTRAVENOUS | Status: DC | PRN
  Administered 2022-12-27: 1000 mg via INTRAVENOUS

## 2022-12-27 MED ORDER — ACETAMINOPHEN 325 MG PO TABS: 325.0000 mg | ORAL_TABLET | Freq: Four times a day (QID) | ORAL | Status: DC | PRN

## 2022-12-27 SURGICAL SUPPLY — 54 items
BLADE SAW 90X25X1.19 OSCILLAT (BLADE) ×1 IMPLANT
BRUSH SCRUB EZ PLAIN DRY (MISCELLANEOUS) ×1 IMPLANT
CUP ACETBLR 54 OD 100 SERIES (Hips) IMPLANT
DRAPE 3/4 80X56 (DRAPES) ×1 IMPLANT
DRAPE INCISE IOBAN 66X60 STRL (DRAPES) ×1 IMPLANT
DRSG AQUACEL AG ADV 3.5X14 (GAUZE/BANDAGES/DRESSINGS) ×1 IMPLANT
DRSG DERMACEA 8X12 NADH (GAUZE/BANDAGES/DRESSINGS) IMPLANT
DRSG MEPILEX SACRM 8.7X9.8 (GAUZE/BANDAGES/DRESSINGS) ×1 IMPLANT
DRSG NON-ADHERENT DERMACEA 3X4 (GAUZE/BANDAGES/DRESSINGS) ×1 IMPLANT
DRSG TEGADERM 4X4.75 (GAUZE/BANDAGES/DRESSINGS) ×1 IMPLANT
DURAPREP 26ML APPLICATOR (WOUND CARE) ×2 IMPLANT
ELECT CAUTERY BLADE 6.4 (BLADE) ×1 IMPLANT
ELECT REM PT RETURN 9FT ADLT (ELECTROSURGICAL) ×1
ELECTRODE REM PT RTRN 9FT ADLT (ELECTROSURGICAL) ×1 IMPLANT
GLOVE BIOGEL M STRL SZ7.5 (GLOVE) ×4 IMPLANT
GLOVE SRG 8 PF TXTR STRL LF DI (GLOVE) ×2 IMPLANT
GLOVE SURG UNDER POLY LF SZ8 (GLOVE) ×2
GOWN STRL REUS W/ TWL LRG LVL3 (GOWN DISPOSABLE) ×2 IMPLANT
GOWN STRL REUS W/ TWL XL LVL3 (GOWN DISPOSABLE) ×1 IMPLANT
GOWN STRL REUS W/TWL LRG LVL3 (GOWN DISPOSABLE) ×2
GOWN STRL REUS W/TWL XL LVL3 (GOWN DISPOSABLE) ×1
GOWN TOGA ZIPPER T7+ PEEL AWAY (MISCELLANEOUS) ×1 IMPLANT
HANDLE YANKAUER SUCT OPEN TIP (MISCELLANEOUS) ×1 IMPLANT
HEAD M SROM 36MM PLUS 1.5 (Hips) IMPLANT
HEMOVAC 400CC 10FR (MISCELLANEOUS) ×1 IMPLANT
HOLDER FOLEY CATH W/STRAP (MISCELLANEOUS) ×1 IMPLANT
HOOD PEEL AWAY T7 (MISCELLANEOUS) ×1 IMPLANT
IV NS IRRIG 3000ML ARTHROMATIC (IV SOLUTION) ×1 IMPLANT
KIT PEG BOARD PINK (KITS) ×1 IMPLANT
KIT TURNOVER KIT A (KITS) ×1 IMPLANT
LINER NEUTRAL 54X36MM PLUS 4 (Hips) IMPLANT
MANIFOLD NEPTUNE II (INSTRUMENTS) ×2 IMPLANT
NS IRRIG 500ML POUR BTL (IV SOLUTION) ×1 IMPLANT
PACK HIP PROSTHESIS (MISCELLANEOUS) ×1 IMPLANT
PIN STEIN THRED 5/32 (Pin) IMPLANT
PULSAVAC PLUS IRRIG FAN TIP (DISPOSABLE) ×1
SOL PREP PVP 2OZ (MISCELLANEOUS) ×1
SOLUTION IRRIG SURGIPHOR (IV SOLUTION) ×1 IMPLANT
SOLUTION PREP PVP 2OZ (MISCELLANEOUS) ×1 IMPLANT
SPONGE DRAIN TRACH 4X4 STRL 2S (GAUZE/BANDAGES/DRESSINGS) ×1 IMPLANT
SROM M HEAD 36MM PLUS 1.5 (Hips) ×1 IMPLANT
STAPLER SKIN PROX 35W (STAPLE) ×1 IMPLANT
STEM FEM ACTIS HIGH SZ7 (Stem) IMPLANT
SUT ETHIBOND #5 BRAIDED 30INL (SUTURE) ×1 IMPLANT
SUT VIC AB 0 CT1 36 (SUTURE) ×2 IMPLANT
SUT VIC AB 1 CT1 36 (SUTURE) ×2 IMPLANT
SUT VIC AB 2-0 CT1 27 (SUTURE) ×1
SUT VIC AB 2-0 CT1 TAPERPNT 27 (SUTURE) ×1 IMPLANT
TAPE CLOTH 3X10 WHT NS LF (GAUZE/BANDAGES/DRESSINGS) ×1 IMPLANT
TIP FAN IRRIG PULSAVAC PLUS (DISPOSABLE) ×1 IMPLANT
TOWEL OR 17X26 4PK STRL BLUE (TOWEL DISPOSABLE) IMPLANT
TRAP FLUID SMOKE EVACUATOR (MISCELLANEOUS) ×1 IMPLANT
TRAY FOLEY MTR SLVR 16FR STAT (SET/KITS/TRAYS/PACK) ×1 IMPLANT
WATER STERILE IRR 1000ML POUR (IV SOLUTION) ×1 IMPLANT

## 2022-12-27 NOTE — Evaluation (Signed)
Physical Therapy Evaluation Patient Details Name: Daniel Garner MRN: [id number] DOB: 1955/10/12 Today's Date: 12/27/2022  History of Present Illness  Pt is a 67 y.o male s/p R THA POD0 secondary to R hip hip and failed conservative treatment. PMH includes anxiety, BPH with obstruction/lower urinary tract symptoms, chicken pox, depression  erectile dysfunction, GERD (gastroesophageal reflux disease), Migraines, Osteoarthritis  Prostatism, and Sleep apnea.  Clinical Impression  Pt is a pleasant 67 year old male who was admitted for a right THA. Pt performs bed mobility, transfers, and ambulation with min assist and a rolling walker. Pt demonstrates deficits with hip mobility, hip strength, and balance. Pt will continue to receive skilled PT services while admitted and will defer to TOC/care team for updates regarding disposition planning.        Recommendations for follow up therapy are one component of a multi-disciplinary discharge planning process, led by the attending physician.  Recommendations may be updated based on patient status, additional functional criteria and insurance authorization.  Follow Up Recommendations       Assistance Recommended at Discharge Intermittent Supervision/Assistance  Patient can return home with the following  A little help with walking and/or transfers;A little help with bathing/dressing/bathroom;Help with stairs or ramp for entrance;Assist for transportation;Assistance with cooking/housework    Equipment Recommendations Rolling walker (2 wheels)  Recommendations for Other Services       Functional Status Assessment Patient has had a recent decline in their functional status and demonstrates the ability to make significant improvements in function in a reasonable and predictable amount of time.     Precautions / Restrictions Precautions Precautions: Posterior Hip Precaution Booklet Issued: No Precaution Comments: patient/family informed verbally of  posterior hip precautions including no hip flexion past 90 degress, no adduction, and no internal rotation Restrictions Weight Bearing Restrictions: Yes (WBAT) Other Position/Activity Restrictions: No hip adduction, No hip internal rotation, No hip flexion past 90 degrees      Mobility  Bed Mobility Overal bed mobility: Needs Assistance Bed Mobility: Supine to Sit     Supine to sit: Min assist     General bed mobility comments: Pt. transferred from long sitting in bed to EOB with min assist to main posterior hip precautions and provide movement education.    Transfers Overall transfer level: Needs assistance Equipment used: Rolling walker (2 wheels) Transfers: Sit to/from Stand Sit to Stand: Min assist           General transfer comment: Patient instructed to place R leg straight infront during standing with L foot pulled back directly underneath patient. Instructed to push up from the bed to stand and then grab the walker with min assist. 1 trial needed.    Ambulation/Gait Ambulation/Gait assistance: Min assist Gait Distance (Feet): 40 Feet Assistive device: Rolling walker (2 wheels) Gait Pattern/deviations: Step-through pattern, Decreased step length - right, Decreased step length - left, Decreased stance time - right, Decreased stance time - left, Decreased stride length, Knees buckling       General Gait Details: Patient was able to follow commands well. Demonstrated b/l knee bucking durin gambulation. Buckling corrected with verbal cueing.  Stairs            Wheelchair Mobility    Modified Rankin (Stroke Patients Only)       Balance Overall balance assessment: Modified Independent  Pertinent Vitals/Pain Pain Assessment Pain Assessment: No/denies pain    Home Living Family/patient expects to be discharged to:: Private residence Living Arrangements: Spouse/significant other Available Help  at Discharge: Family Type of Home: House Home Access: Stairs to enter Entrance Stairs-Rails: Can reach both Entrance Stairs-Number of Steps: 5 Alternate Level Stairs-Number of Steps: flight Home Layout: Two level;1/2 bath on main level Home Equipment: None Additional Comments: Patient has a lift for the toilet to increase its height.    Prior Function Prior Level of Function : Independent/Modified Independent;Driving             Mobility Comments: Patient states they were previously active with grocery shopping, driving and mowing the lawn. Utilized items like the mower and grocery cart to distribute weight through b/l UE when walking. ADLs Comments: Patient was able to perform ADL's with the physical assistance of his wife or by leaning on object ( mower/shopping cart).     Hand Dominance        Extremity/Trunk Assessment   Upper Extremity Assessment Upper Extremity Assessment: Overall WFL for tasks assessed    Lower Extremity Assessment Lower Extremity Assessment: Overall WFL for tasks assessed (Patient displays weakness with knee extension when ambulating. Able)       Communication   Communication: No difficulties  Cognition Arousal/Alertness: Awake/alert Behavior During Therapy: WFL for tasks assessed/performed Overall Cognitive Status: Within Functional Limits for tasks assessed                                          General Comments      Exercises     Assessment/Plan    PT Assessment Patient needs continued PT services  PT Problem List Decreased strength;Decreased range of motion;Decreased activity tolerance;Decreased balance;Decreased mobility;Decreased knowledge of precautions;Pain       PT Treatment Interventions DME instruction;Gait training;Stair training;Functional mobility training    PT Goals (Current goals can be found in the Care Plan section)  Acute Rehab PT Goals Patient Stated Goal: to go home PT Goal Formulation:  With patient/family Time For Goal Achievement: 01/10/23 Potential to Achieve Goals: Good    Frequency BID     Co-evaluation               AM-PAC PT "6 Clicks" Mobility  Outcome Measure Help needed turning from your back to your side while in a flat bed without using bedrails?: A Little Help needed moving from lying on your back to sitting on the side of a flat bed without using bedrails?: A Little Help needed moving to and from a bed to a chair (including a wheelchair)?: A Little Help needed standing up from a chair using your arms (e.g., wheelchair or bedside chair)?: A Little Help needed to walk in hospital room?: A Little Help needed climbing 3-5 steps with a railing? : A Little 6 Click Score: 18    End of Session Equipment Utilized During Treatment: Gait belt Activity Tolerance: Patient tolerated treatment well Patient left: in chair;with call bell/phone within reach Nurse Communication: Mobility status PT Visit Diagnosis: Muscle weakness (generalized) (M62.81);Difficulty in walking, not elsewhere classified (R26.2);Pain;Unsteadiness on feet (R26.81) Pain - Right/Left: Right Pain - part of body: Hip    Time: 7829-5621 PT Time Calculation (min) (ACUTE ONLY): 31 min   Charges:   PT Evaluation $PT Eval Low Complexity: 1 Low PT Treatments $Gait Training: 8-22 mins  Malachi Carl, SPT   Malachi Carl 12/27/2022, 5:18 PM

## 2022-12-27 NOTE — Anesthesia Preprocedure Evaluation (Addendum)
Anesthesia Evaluation  Patient identified by MRN, date of birth, ID band Patient awake    Reviewed: Allergy & Precautions, NPO status , Patient's Chart, lab work & pertinent test results  History of Anesthesia Complications Negative for: history of anesthetic complications  Airway Mallampati: III   Neck ROM: Full    Dental  (+) Missing   Pulmonary sleep apnea and Continuous Positive Airway Pressure Ventilation    Pulmonary exam normal breath sounds clear to auscultation       Cardiovascular hypertension, Normal cardiovascular exam Rhythm:Regular Rate:Normal  ECG 12/16/22: normal   Neuro/Psych  Headaches PSYCHIATRIC DISORDERS Anxiety Depression       GI/Hepatic negative GI ROS,,,  Endo/Other  negative endocrine ROS    Renal/GU Renal disease (nephrolithiasis)   BPH    Musculoskeletal  (+) Arthritis ,    Abdominal   Peds  Hematology negative hematology ROS (+)   Anesthesia Other Findings   Reproductive/Obstetrics                             Anesthesia Physical Anesthesia Plan  ASA: 2  Anesthesia Plan: General and Spinal   Post-op Pain Management:    Induction: Intravenous  PONV Risk Score and Plan: 2 and Propofol infusion, TIVA, Treatment may vary due to age or medical condition and Ondansetron  Airway Management Planned: Natural Airway and Nasal Cannula  Additional Equipment:   Intra-op Plan:   Post-operative Plan:   Informed Consent: I have reviewed the patients History and Physical, chart, labs and discussed the procedure including the risks, benefits and alternatives for the proposed anesthesia with the patient or authorized representative who has indicated his/her understanding and acceptance.       Plan Discussed with: CRNA  Anesthesia Plan Comments: (Plan for spinal and GA with natural airway, LMA/GETA backup.  Patient consented for risks of anesthesia including  but not limited to:  - adverse reactions to medications - damage to eyes, teeth, lips or other oral mucosa - nerve damage due to positioning  - sore throat or hoarseness - headache, bleeding, infection, nerve damage 2/2 spinal - damage to heart, brain, nerves, lungs, other parts of body or loss of life  Informed patient about role of CRNA in peri- and intra-operative care.  Patient voiced understanding.)        Anesthesia Quick Evaluation

## 2022-12-27 NOTE — Progress Notes (Deleted)
Physical Therapy Treatment Patient Details Name: Daniel Garner MRN: [id number] DOB: [birth year] Today's Date: 12/27/2022   History of Present Illness Pt is a 67 y.o male s/p R THA POD0 secondary to R hip hip and failed conservative treatment. PMH includes anxiety, BPH with obstruction/lower urinary tract symptoms, chicken pox, depression  erectile dysfunction, GERD (gastroesophageal reflux disease), Migraines, Osteoarthritis  Prostatism, and Sleep apnea.    PT Comments    Pt is a pleasant 67 year old male who was admitted for a right THA. Pt performs bed mobility, transfers, and ambulation with min assist and a rolling walker. Pt demonstrates deficits with hip mobility, hip strength, and balance. Pt will continue to receive skilled PT services while admitted and will defer to TOC/care team for updates regarding disposition planning.    Recommendations for follow up therapy are one component of a multi-disciplinary discharge planning process, led by the attending physician.  Recommendations may be updated based on patient status, additional functional criteria and insurance authorization.  Follow Up Recommendations       Assistance Recommended at Discharge Intermittent Supervision/Assistance  Patient can return home with the following A little help with walking and/or transfers;A little help with bathing/dressing/bathroom;Help with stairs or ramp for entrance;Assist for transportation;Assistance with cooking/housework   Equipment Recommendations  Rolling walker (2 wheels)    Recommendations for Other Services       Precautions / Restrictions Precautions Precautions: Posterior Hip Precaution Booklet Issued: No Precaution Comments: patient/family informed verbally of posterior hip precautions including no hip flexion past 90 degress, no adduction, and no internal rotation Restrictions Weight Bearing Restrictions: Yes (WBAT) Other Position/Activity Restrictions: No hip adduction, No hip  internal rotation, No hip flexion past 90 degrees     Mobility  Bed Mobility Overal bed mobility: Needs Assistance Bed Mobility: Supine to Sit     Supine to sit: Min assist     General bed mobility comments: Pt. transferred from long sitting in bed to EOB with min assist to main posterior hip precautions and provide movement education.    Transfers Overall transfer level: Needs assistance Equipment used: Rolling walker (2 wheels) Transfers: Sit to/from Stand Sit to Stand: Min assist           General transfer comment: Patient instructed to place R leg straight infront during standing with L foot pulled back directly underneath patient. Instructed to push up from the bed to stand and then grab the walker with min assist. 1 trial needed.    Ambulation/Gait Ambulation/Gait assistance: Min assist Gait Distance (Feet): 40 Feet Assistive device: Rolling walker (2 wheels) Gait Pattern/deviations: Step-through pattern, Decreased step length - right, Decreased step length - left, Decreased stance time - right, Decreased stance time - left, Decreased stride length, Knees buckling       General Gait Details: Patient was able to follow commands well. Demonstrated b/l knee bucking durin gambulation. Buckling corrected with verbal cueing.   Stairs             Wheelchair Mobility    Modified Rankin (Stroke Patients Only)       Balance Overall balance assessment: Modified Independent                                          Cognition Arousal/Alertness: Awake/alert Behavior During Therapy: WFL for tasks assessed/performed Overall Cognitive Status: Within Functional Limits for tasks assessed  Exercises      General Comments        Pertinent Vitals/Pain Pain Assessment Pain Assessment: No/denies pain    Home Living Family/patient expects to be discharged to:: Private residence Living  Arrangements: Spouse/significant other Available Help at Discharge: Family Type of Home: House Home Access: Stairs to enter Entrance Stairs-Rails: Can reach both Entrance Stairs-Number of Steps: 5 Alternate Level Stairs-Number of Steps: flight Home Layout: Two level;1/2 bath on main level Home Equipment: None Additional Comments: Patient has a lift for the toilet to increase its height.    Prior Function            PT Goals (current goals can now be found in the care plan section) Acute Rehab PT Goals Patient Stated Goal: to go home PT Goal Formulation: With patient/family Time For Goal Achievement: 01/10/23 Potential to Achieve Goals: Good    Frequency    BID      PT Plan      Co-evaluation              AM-PAC PT "6 Clicks" Mobility   Outcome Measure  Help needed turning from your back to your side while in a flat bed without using bedrails?: A Little Help needed moving from lying on your back to sitting on the side of a flat bed without using bedrails?: A Little Help needed moving to and from a bed to a chair (including a wheelchair)?: A Little Help needed standing up from a chair using your arms (e.g., wheelchair or bedside chair)?: A Little Help needed to walk in hospital room?: A Little Help needed climbing 3-5 steps with a railing? : A Little 6 Click Score: 18    End of Session Equipment Utilized During Treatment: Gait belt Activity Tolerance: Patient tolerated treatment well Patient left: in chair;with call bell/phone within reach Nurse Communication: Mobility status PT Visit Diagnosis: Muscle weakness (generalized) (M62.81);Difficulty in walking, not elsewhere classified (R26.2);Pain;Unsteadiness on feet (R26.81) Pain - Right/Left: Right Pain - part of body: Hip     Time: 1610-9604 PT Time Calculation (min) (ACUTE ONLY): 31 min  Charges:  $Gait Training: 8-22 mins                     Malachi Carl, SPT    Malachi Carl 12/27/2022, 5:13  PM

## 2022-12-27 NOTE — Transfer of Care (Signed)
Immediate Anesthesia Transfer of Care Note  Patient: Daniel Garner  Procedure(s) Performed: TOTAL HIP ARTHROPLASTY (Right: Hip)  Patient Location: PACU  Anesthesia Type:Spinal  Level of Consciousness: awake, alert , and oriented  Airway & Oxygen Therapy: Patient Spontanous Breathing  Post-op Assessment: Report given to RN and Post -op Vital signs reviewed and stable  Post vital signs: Reviewed and stable  Last Vitals:  Vitals Value Taken Time  BP 113/79   Temp    Pulse 86   Resp 12   SpO2 97     Last Pain:  Vitals:   12/27/22 0617  PainSc: 0-No pain         Complications: No notable events documented.

## 2022-12-27 NOTE — Op Note (Signed)
OPERATIVE NOTE  DATE OF SURGERY:  12/27/2022  PATIENT NAME:  Daniel Garner   DOB: 05-19-1956  MRN: [id number]  PRE-OPERATIVE DIAGNOSIS: Degenerative arthrosis of the right hip, primary  POST-OPERATIVE DIAGNOSIS:  Same  PROCEDURE:  Right total hip arthroplasty  SURGEON:  Jena Gauss. M.D.  ASSISTANT:  Gean Birchwood, PA-C (present and scrubbed throughout the case, critical for assistance with exposure, retraction, instrumentation, and closure)  ANESTHESIA: spinal  ESTIMATED BLOOD LOSS: 100 mL  FLUIDS REPLACED: 800 mL of crystalloid  DRAINS: 2 medium Hemovac drains  IMPLANTS UTILIZED: DePuy size 7 high offset Actis femoral stem, 54 mm OD Pinnacle 100 acetabular component, +4 mm neutral Pinnacle Altrx polyethylene insert, and a 36 mm M-SPEC +1.5 mm hip ball  INDICATIONS FOR SURGERY: Daniel Garner is a 67 y.o. year old male with a long history of progressive hip and groin  pain. X-rays demonstrated severe degenerative changes. The patient had not seen any significant improvement despite conservative nonsurgical intervention. After discussion of the risks and benefits of surgical intervention, the patient expressed understanding of the risks benefits and agree with plans for total hip arthroplasty.   The risks, benefits, and alternatives were discussed at length including but not limited to the risks of infection, bleeding, nerve injury, stiffness, blood clots, the need for revision surgery, limb length inequality, dislocation, cardiopulmonary complications, among others, and they were willing to proceed.  PROCEDURE IN DETAIL: The patient was brought into the operating room and, after adequate spinal anesthesia was achieved, the patient was placed in a left lateral decubitus position. Axillary roll was placed and all bony prominences were well-padded. The patient's right hip was cleaned and prepped with alcohol and DuraPrep and draped in the usual sterile fashion. A "timeout" was  performed as per usual protocol. A lateral curvilinear incision was made gently curving towards the posterior superior iliac spine. The IT band was incised in line with the skin incision and the fibers of the gluteus maximus were split in line. The piriformis tendon was identified, skeletonized, and incised at its insertion to the proximal femur and reflected posteriorly. A T type posterior capsulotomy was performed. Prior to dislocation of the femoral head, a threaded Steinmann pin was inserted through a separate stab incision into the pelvis superior to the acetabulum and bent in the form of a stylus so as to assess limb length and hip offset throughout the procedure. The femoral head was then dislocated posteriorly. Inspection of the femoral head demonstrated severe degenerative changes with full-thickness loss of articular cartilage. The femoral neck cut was performed using an oscillating saw. The anterior capsule was elevated off of the femoral neck using a periosteal elevator. Attention was then directed to the acetabulum. The remnant of the labrum was excised using electrocautery. Inspection of the acetabulum also demonstrated significant degenerative changes. The acetabulum was reamed in sequential fashion up to a 53 mm diameter. Good punctate bleeding bone was encountered. A 54 mm Pinnacle 100 acetabular component was positioned and impacted into place. Good scratch fit was appreciated. A +4 mm neutral polyethylene trial was inserted.  Attention was then directed to the proximal femur.  Femoral broaches were inserted in a sequential fashion up to a size 7 broach. Calcar region was planed and a trial reduction was performed using a standard offset neck and a 36 mm hip ball with a +1.5 mm neck length.  Reasonably good stability was noted but there was slight decrease in the hip offset.  The standard  offset neck segment was replaced with a high offset neck and the hip was again reduced with the 36 mm hip  ball with a +1.5 mm neck length.  Good equalization of limb lengths and hip offset was appreciated and excellent stability was noted both anteriorly and posteriorly. Trial components were removed. The acetabular shell was irrigated with copious amounts of normal saline with antibiotic solution and suctioned dry. A +4 mm neutral Pinnacle Altrx polyethylene insert was positioned and impacted into place. Next, a size 7 high offset Actis femoral stem was positioned and impacted into place. Excellent scratch fit was appreciated. A trial reduction was again performed with a 36 mm hip ball with a +1.5 mm neck length. Again, good equalization of limb lengths was appreciated and excellent stability appreciated both anteriorly and posteriorly. The hip was then dislocated and the trial hip ball was removed. The Morse taper was cleaned and dried. A 36 mm M-SPEC hip ball with a +1.5 mm neck length was placed on the trunnion and impacted into place. The hip was then reduced and placed through range of motion. Excellent stability was appreciated both anteriorly and posteriorly.  The wound was irrigated with copious amounts of normal saline followed by 450 ml of Surgiphor and suctioned dry. Good hemostasis was appreciated. The posterior capsulotomy was repaired using #5 Ethibond. Piriformis tendon was reapproximated to the undersurface of the gluteus medius tendon using #5 Ethibond. The IT band was reapproximated using interrupted sutures of #1 Vicryl. Subcutaneous tissue was approximated using first #0 Vicryl followed by #2-0 Vicryl. The skin was closed with skin staples.  The patient tolerated the procedure well and was transported to the recovery room in stable condition.   Jena Gauss., M.D.

## 2022-12-27 NOTE — Anesthesia Procedure Notes (Signed)
Spinal  Patient location during procedure: OR Start time: 12/27/2022 7:27 AM Reason for block: surgical anesthesia Staffing Other anesthesia staff: Allayne Butcher, RN Performed by: Reece Agar, CRNA Authorized by: Reed Breech, MD   Preanesthetic Checklist Completed: patient identified, IV checked, site marked, risks and benefits discussed, surgical consent, monitors and equipment checked, pre-op evaluation and timeout performed Spinal Block Patient position: sitting Prep: DuraPrep Patient monitoring: heart rate, cardiac monitor, continuous pulse ox and blood pressure Approach: midline Location: L3-4 Injection technique: single-shot Needle Needle type: Pencan  Needle gauge: 24 G Needle length: 9 cm Assessment Sensory level: T4 Events: CSF return

## 2022-12-27 NOTE — Anesthesia Postprocedure Evaluation (Signed)
Anesthesia Post Note  Patient: Daniel Garner  Procedure(s) Performed: TOTAL HIP ARTHROPLASTY (Right: Hip)  Patient location during evaluation: PACU Anesthesia Type: Spinal Level of consciousness: awake and alert, oriented and patient cooperative Pain management: pain level controlled Vital Signs Assessment: post-procedure vital signs reviewed and stable Respiratory status: spontaneous breathing, nonlabored ventilation and respiratory function stable Cardiovascular status: blood pressure returned to baseline and stable Postop Assessment: adequate PO intake, no headache, no backache and spinal receding Anesthetic complications: no   No notable events documented.   Last Vitals:  Vitals:   12/27/22 1230 12/27/22 1307  BP: 121/76 121/78  Pulse: 72 87  Resp: 17 18  Temp:    SpO2: 99% 96%    Last Pain:  Vitals:   12/27/22 1307  PainSc: 2                  Reed Breech

## 2022-12-27 NOTE — Interval H&P Note (Signed)
History and Physical Interval Note:  12/27/2022 6:12 AM  Daniel Garner  has presented today for surgery, with the diagnosis of PRIMARY OSTEOARTHRITIS OF RIGHT HIP.  The various methods of treatment have been discussed with the patient and family. After consideration of risks, benefits and other options for treatment, the patient has consented to  Procedure(s): TOTAL HIP ARTHROPLASTY (Right) as a surgical intervention.  The patient's history has been reviewed, patient examined, no change in status, stable for surgery.  I have reviewed the patient's chart and labs.  Questions were answered to the patient's satisfaction.     Charrise Lardner P Vinie Charity

## 2022-12-28 DIAGNOSIS — I10 Essential (primary) hypertension: Secondary | ICD-10-CM | POA: Diagnosis not present

## 2022-12-28 DIAGNOSIS — Z79899 Other long term (current) drug therapy: Secondary | ICD-10-CM | POA: Diagnosis not present

## 2022-12-28 DIAGNOSIS — M1611 Unilateral primary osteoarthritis, right hip: Secondary | ICD-10-CM | POA: Diagnosis not present

## 2022-12-28 DIAGNOSIS — Z96641 Presence of right artificial hip joint: Secondary | ICD-10-CM | POA: Diagnosis not present

## 2022-12-28 DIAGNOSIS — Z8616 Personal history of COVID-19: Secondary | ICD-10-CM | POA: Diagnosis not present

## 2022-12-28 MED ORDER — ASPIRIN 81 MG PO TBEC: 81.0000 mg | DELAYED_RELEASE_TABLET | Freq: Two times a day (BID) | ORAL | 0 refills | Status: DC

## 2022-12-28 MED ORDER — OXYCODONE HCL 5 MG PO TABS: 5.0000 mg | ORAL_TABLET | ORAL | 0 refills | Status: DC | PRN

## 2022-12-28 MED ORDER — CELECOXIB 200 MG PO CAPS: 200.0000 mg | ORAL_CAPSULE | Freq: Two times a day (BID) | ORAL | 1 refills | Status: DC

## 2022-12-28 MED ORDER — MAGNESIUM HYDROXIDE 400 MG/5ML PO SUSP
ORAL | Status: AC
  Filled 2022-12-28: qty 30

## 2022-12-28 MED ORDER — PANTOPRAZOLE SODIUM 40 MG PO TBEC
DELAYED_RELEASE_TABLET | ORAL | Status: AC
  Filled 2022-12-28: qty 1

## 2022-12-28 MED ORDER — METOCLOPRAMIDE HCL 10 MG PO TABS
ORAL_TABLET | ORAL | Status: AC
  Filled 2022-12-28: qty 1

## 2022-12-28 MED ORDER — CELECOXIB 200 MG PO CAPS
ORAL_CAPSULE | ORAL | Status: AC
  Filled 2022-12-28: qty 1

## 2022-12-28 MED ORDER — SENNOSIDES-DOCUSATE SODIUM 8.6-50 MG PO TABS
ORAL_TABLET | ORAL | Status: AC
  Filled 2022-12-28: qty 1

## 2022-12-28 MED ORDER — OXYCODONE HCL 5 MG PO TABS
ORAL_TABLET | ORAL | Status: AC
  Filled 2022-12-28: qty 1

## 2022-12-28 MED ORDER — ASPIRIN 81 MG PO CHEW
CHEWABLE_TABLET | ORAL | Status: AC
  Filled 2022-12-28: qty 1

## 2022-12-28 MED ORDER — TRAMADOL HCL 50 MG PO TABS: 50.0000 mg | ORAL_TABLET | ORAL | 0 refills | Status: DC | PRN

## 2022-12-28 MED ORDER — ACETAMINOPHEN 10 MG/ML IV SOLN
INTRAVENOUS | Status: AC
  Filled 2022-12-28: qty 100

## 2022-12-28 MED ORDER — FERROUS SULFATE 325 (65 FE) MG PO TABS
ORAL_TABLET | ORAL | Status: AC
  Filled 2022-12-28: qty 1

## 2022-12-28 NOTE — Evaluation (Signed)
Occupational Therapy Evaluation Patient Details Name: Daniel Garner MRN: [id number] DOB: [birth year] Today's Date: 12/28/2022   History of Present Illness Pt is a 67 y.o male s/p R THA POD0 secondary to R hip hip and failed conservative treatment. PMH includes anxiety, BPH with obstruction/lower urinary tract symptoms, chicken pox, depression  erectile dysfunction, GERD (gastroesophageal reflux disease), Migraines, Osteoarthritis  Prostatism, and Sleep apnea.   Clinical Impression   Pt was seen for OT evaluation this date. Prior to hospital admission, pt was independent in the community. Pt lives with spouse in two story house with 1/2 bath on first floor. Pt presents to acute OT demonstrating impaired ADL performance and functional mobility 2/2 decreased ROM and activity tolerance (See OT problem list for additional functional deficits). Pt currently requires MIN A for lower body dressing to maintain posterior hip precautions. Pt and spouse educated on hip precautions, dressing techniques. Pt stood with supervision and donned shirt. Pt and family report no concerns about return home safely. Do not anticipate the need for follow up OT services upon acute hospital DC. No concerns noted at this time, OT will sign off.       Recommendations for follow up therapy are one component of a multi-disciplinary discharge planning process, led by the attending physician.  Recommendations may be updated based on patient status, additional functional criteria and insurance authorization.   Assistance Recommended at Discharge PRN  Patient can return home with the following A little help with walking and/or transfers;A little help with bathing/dressing/bathroom;Assist for transportation;Help with stairs or ramp for entrance    Functional Status Assessment  Patient has had a recent decline in their functional status and demonstrates the ability to make significant improvements in function in a reasonable and  predictable amount of time.  Equipment Recommendations  None recommended by OT    Recommendations for Other Services       Precautions / Restrictions Precautions Precautions: Posterior Hip Precaution Booklet Issued: Yes (comment) Precaution Comments: Pt able to recall precautions without prompting. Restrictions Weight Bearing Restrictions: Yes Other Position/Activity Restrictions: No hip adduction, No hip internal rotation, No hip flexion past 90 degrees      Mobility Bed Mobility               General bed mobility comments: Not tested    Transfers Overall transfer level: Needs assistance Equipment used: Rolling walker (2 wheels) Transfers: Sit to/from Stand Sit to Stand: Min guard           General transfer comment: Demonstrated appropriate positioning during transition from sit<>stand      Balance Overall balance assessment: Needs assistance Sitting-balance support: No upper extremity supported, Feet supported Sitting balance-Leahy Scale: Good     Standing balance support: No upper extremity supported Standing balance-Leahy Scale: Good                             ADL either performed or assessed with clinical judgement   ADL Overall ADL's : Needs assistance/impaired                 Upper Body Dressing : Supervision/safety;Standing   Lower Body Dressing: Minimal assistance Lower Body Dressing Details (indicate cue type and reason): MIN A thread clothing over feet, don/doff TED hose and shoes               General ADL Comments: Anticipated MIN A for lower body dressing and set up assist for ADL  completion     Vision         Perception     Praxis      Pertinent Vitals/Pain Pain Assessment Pain Assessment: 0-10 Pain Score: 7  Pain Location: R knee Pain Descriptors / Indicators: Aching, Discomfort, Sore Pain Intervention(s): Relaxation, Repositioned     Hand Dominance     Extremity/Trunk Assessment Upper  Extremity Assessment Upper Extremity Assessment: Overall WFL for tasks assessed   Lower Extremity Assessment Lower Extremity Assessment: Overall WFL for tasks assessed       Communication Communication Communication: No difficulties   Cognition Arousal/Alertness: Awake/alert Behavior During Therapy: WFL for tasks assessed/performed Overall Cognitive Status: Within Functional Limits for tasks assessed                                 General Comments: Able to recall posterior hip precautions     General Comments       Exercises     Shoulder Instructions      Home Living Family/patient expects to be discharged to:: Private residence Living Arrangements: Spouse/significant other Available Help at Discharge: Family Type of Home: House Home Access: Stairs to enter Secretary/administrator of Steps: 5 Entrance Stairs-Rails: Can reach both Home Layout: Two level;1/2 bath on main level     Bathroom Shower/Tub: Chief Strategy Officer: Standard Bathroom Accessibility: Yes How Accessible: Accessible via walker     Additional Comments: Patient has a lift for the toilet to increase its height.      Prior Functioning/Environment Prior Level of Function : Independent/Modified Independent;Driving               ADLs Comments: Independent        OT Problem List: Decreased range of motion;Decreased activity tolerance      OT Treatment/Interventions:      OT Goals(Current goals can be found in the care plan section) Acute Rehab OT Goals Patient Stated Goal: To go home OT Goal Formulation: With patient Time For Goal Achievement: 01/11/23 Potential to Achieve Goals: Good  OT Frequency:      Co-evaluation              AM-PAC OT "6 Clicks" Daily Activity     Outcome Measure Help from another person eating meals?: None Help from another person taking care of personal grooming?: None Help from another person toileting, which includes  using toliet, bedpan, or urinal?: A Little Help from another person bathing (including washing, rinsing, drying)?: A Little Help from another person to put on and taking off regular upper body clothing?: None Help from another person to put on and taking off regular lower body clothing?: A Little 6 Click Score: 21   End of Session Equipment Utilized During Treatment: Rolling walker (2 wheels)  Activity Tolerance: Patient tolerated treatment well Patient left: in chair;with call bell/phone within reach  OT Visit Diagnosis: Unsteadiness on feet (R26.81)                Time: 1610-9604 OT Time Calculation (min): 21 min Charges:  OT General Charges $OT Visit: 1 Visit OT Evaluation $OT Eval Low Complexity: 1 Low  [employer], OTS

## 2022-12-28 NOTE — Progress Notes (Signed)
Physical Therapy Treatment Patient Details Name: Daniel Garner MRN: [id number] DOB: 06/19/1956 Today's Date: 12/28/2022   History of Present Illness Pt is a 67 y.o male s/p R THA POD0 secondary to R hip hip and failed conservative treatment. PMH includes anxiety, BPH with obstruction/lower urinary tract symptoms, chicken pox, depression  erectile dysfunction, GERD (gastroesophageal reflux disease), Migraines, Osteoarthritis  Prostatism, and Sleep apnea.    PT Comments    Patient sitting up in bed upon arrival and self report pain 2/10 on NPS. Provided patient/family with education for precautions and HEP where they verbalized/demonstrated understanding. Transferred from long sitting to EOB for sit to stand with RW and min assist.Continued cueing needed for keeping his right knee straight. Ambulated approximately 100 feet before resting for 1 minute and the continuing to ambulate 29ft. SPT demonstrated appropriate stair navigation for return to home. Patient verbalized/demonstrated understanding. Stairs were navigated with a step to pattern going up with the L LE, following with the R LE. Then stepping down with R LE, following with the L LE. Emphasis placed on distributing weight through B/L UE. Patient has met assign goals for therapy. Will continue to benefit/progress with next level of therapy.  Patient was left up in the chair with LE's supported and tented. Call bell in reach.   Recommendations for follow up therapy are one component of a multi-disciplinary discharge planning process, led by the attending physician.  Recommendations may be updated based on patient status, additional functional criteria and insurance authorization.  Follow Up Recommendations       Assistance Recommended at Discharge Intermittent Supervision/Assistance  Patient can return home with the following     Equipment Recommendations  Rolling walker (2 wheels)    Recommendations for Other Services        Precautions / Restrictions Precautions Precautions: Posterior Hip Precaution Booklet Issued: Yes (comment) Precaution Comments: patient/family informed verbally of posterior hip precautions including no hip flexion past 90 degress, no adduction, and no internal rotation Restrictions Weight Bearing Restrictions: Yes Other Position/Activity Restrictions: No hip adduction, No hip internal rotation, No hip flexion past 90 degrees     Mobility  Bed Mobility Overal bed mobility: Needs Assistance Bed Mobility: Supine to Sit     Supine to sit: Min assist     General bed mobility comments: Pt. transferred from long sitting in bed to EOB with min assist to main posterior hip precautions and provide movement education.    Transfers Overall transfer level: Needs assistance Equipment used: Rolling walker (2 wheels) Transfers: Sit to/from Stand Sit to Stand: Min assist           General transfer comment: Patient instructed to place R leg straight infront during standing with L foot pulled back directly underneath patient. Instructed to push up from the bed to stand and then grab the walker with min assist.    Ambulation/Gait Ambulation/Gait assistance: Min assist Gait Distance (Feet): 160 Feet Assistive device: Rolling walker (2 wheels) Gait Pattern/deviations: Step-through pattern, Decreased step length - left, Decreased stance time - right, Decreased stride length, Knees buckling       General Gait Details: Patient was able to follow commands for correction during ambulation well. Cueing needed for patient to not look at their feet. Patients needs reminders for rest breaks due to knees buckling with fatigue. Chair follow was utilized for rest breaks. 2 rest breaks for approximately 1 minute were needed at varying intervals.   Stairs Stairs: Yes Stairs assistance: Min guard Stair Management:  Two rails, Step to pattern, Forwards Number of Stairs: 4 General stair comments:  Patient demonstrated a step to gait pattern, stepping up with the L LE and following with the right. Then stepping down with the R LE and following with the left. Education given on maintaining b/l hand rail holding and distributing weight through UE's.   Wheelchair Mobility    Modified Rankin (Stroke Patients Only)       Balance Overall balance assessment: Modified Independent                                          Cognition Arousal/Alertness: Awake/alert Behavior During Therapy: WFL for tasks assessed/performed Overall Cognitive Status: Within Functional Limits for tasks assessed                                 General Comments: Patient was alert in bed at start of session. Was able to remember 1/3 of his precautions before needing a reminder.        Exercises Total Joint Exercises Ankle Circles/Pumps: Both, 5 reps, Supine, AROM Gluteal Sets: 5 reps, Supine, AROM Straight Leg Raises: 5 reps, Left, AROM, Right, AAROM    General Comments        Pertinent Vitals/Pain Pain Assessment Pain Assessment: 0-10 Pain Score: 5  Pain Location: R hip and knee; Pain increased from 2/10 to 5/10 with mobility. Pain Descriptors / Indicators: Aching, Discomfort, Sore Pain Intervention(s): Limited activity within patient's tolerance, Monitored during session, Premedicated before session, Repositioned, Ice applied    Home Living                          Prior Function            PT Goals (current goals can now be found in the care plan section) Acute Rehab PT Goals Patient Stated Goal: to go home PT Goal Formulation: With patient/family Time For Goal Achievement: 01/10/23 Potential to Achieve Goals: Good Progress towards PT goals: Progressing toward goals    Frequency    BID      PT Plan Current plan remains appropriate    Co-evaluation              AM-PAC PT "6 Clicks" Mobility   Outcome Measure  Help needed  turning from your back to your side while in a flat bed without using bedrails?: A Little Help needed moving from lying on your back to sitting on the side of a flat bed without using bedrails?: A Little Help needed moving to and from a bed to a chair (including a wheelchair)?: A Little Help needed standing up from a chair using your arms (e.g., wheelchair or bedside chair)?: A Little Help needed to walk in hospital room?: A Little Help needed climbing 3-5 steps with a railing? : A Little 6 Click Score: 18    End of Session Equipment Utilized During Treatment: Gait belt Activity Tolerance: Patient tolerated treatment well Patient left: in chair;with call bell/phone within reach;with family/visitor present Nurse Communication: Mobility status PT Visit Diagnosis: Muscle weakness (generalized) (M62.81);Difficulty in walking, not elsewhere classified (R26.2);Pain;Unsteadiness on feet (R26.81) Pain - Right/Left: Right Pain - part of body: Hip     Time: 1610-9604 PT Time Calculation (min) (ACUTE ONLY): 32 min  Charges:  Malachi Carl, SPT    Malachi Carl 12/28/2022, 9:51 AM

## 2022-12-28 NOTE — Progress Notes (Signed)
Patient is not able to walk the distance required to go the bathroom, or he/she is unable to safely negotiate stairs required to access the bathroom.  A 3in1 BSC will alleviate this problem   Ariyan Sinnett P. Yandell Mcjunkins, Jr. M.D.  

## 2022-12-28 NOTE — TOC Progression Note (Signed)
Transition of Care [facility]) - Progression Note    Patient Details  Name: Daniel Garner MRN: [id number] Date of Birth: Mar 20, 1956  Transition of Care [facility]) CM/SW Contact  Garret Reddish, RN Phone Number: 12/28/2022, 1:12 PM  Clinical Narrative:   Patient has requested a BSC.  I have asked [employer] to provide [facility] to bedside today.    I have informed staff nurse of above information.        Barriers to Discharge: No Barriers Identified  Expected Discharge Plan and Services         Expected Discharge Date: 12/28/22               DME Arranged: Dan Humphreys rolling DME Agency: [employer] Date DME Agency Contacted: 12/28/22 Time DME Agency Contacted: 1000 Representative spoke with at DME Agency: Barbara Cower HH Arranged: PT, OT HH Agency: [facility] Date [facility] Agency Contacted: 12/28/22 Time HH Agency Contacted: 1000 Representative spoke with at [facility] Agency: [place]   Social Determinants of Health (SDOH) Interventions SDOH Screenings   Food Insecurity: No Food Insecurity (12/27/2022)  Housing: Low Risk  (12/27/2022)  Transportation Needs: No Transportation Needs (12/27/2022)  Utilities: Not At Risk (12/27/2022)  Alcohol Screen: Low Risk  (11/16/2022)  Depression (PHQ2-9): Low Risk  (11/16/2022)  Financial Resource Strain: Low Risk  (11/16/2022)  Physical Activity: Sufficiently Active (11/16/2022)  Social Connections: Socially Integrated (11/16/2022)  Stress: No Stress Concern Present (11/16/2022)  Tobacco Use: Low Risk  (12/27/2022)    Readmission Risk Interventions     No data to display

## 2022-12-28 NOTE — Plan of Care (Signed)
  Problem: Activity: Goal: Ability to avoid complications of mobility impairment will improve Outcome: Progressing Goal: Ability to tolerate increased activity will improve Outcome: Progressing   Problem: Pain Management: Goal: Pain level will decrease with appropriate interventions Outcome: Progressing   Problem: Skin Integrity: Goal: Will show signs of wound healing Outcome: Progressing   

## 2022-12-28 NOTE — Discharge Summary (Addendum)
Physician Discharge Summary  Subjective: 1 Day Post-Op Procedure(s) (LRB): TOTAL HIP ARTHROPLASTY (Right) Patient reports pain as mild.   Patient seen in rounds with Dr. Ernest Pine. Patient is well, and has had no acute complaints or problems Patient is ready to go home  Physician Discharge Summary  Patient ID: Daniel Garner MRN: [id number] DOB/AGE: [birth year] 67 y.o.  Admit date: 12/27/2022 Discharge date: 12/28/2022  Admission Diagnoses:  Discharge Diagnoses:  Principal Problem:   Hx of total hip arthroplasty, right   Discharged Condition: good  Hospital Course: Patient presented on 12/27/2022 to the hospital for a elective right total hip arthroplasty performed by Dr. Ernest Pine.  Patient was given 1 g of TXA and 2 g of Ancef perioperatively.  Patient tolerated the surgery well, see operative details below.  Postoperatively patient had drainage from his JP drain equal to about just over 300 mL of serosanguineous drainage.  His JP drain was pulled on day 1, without any drainage appreciated from the drain site.  Patient was able to pass his PT protocols postop day 1.  Patient vital signs are stable.  He is stable for discharge.  PROCEDURE:  Right total hip arthroplasty   SURGEON:  Jena Gauss. M.D.   ASSISTANT:  Gean Birchwood, PA-C (present and scrubbed throughout the case, critical for assistance with exposure, retraction, instrumentation, and closure)   ANESTHESIA: spinal   ESTIMATED BLOOD LOSS: 100 mL   FLUIDS REPLACED: 800 mL of crystalloid   DRAINS: 2 medium Hemovac drains   IMPLANTS UTILIZED: DePuy size 7 high offset Actis femoral stem, 54 mm OD Pinnacle 100 acetabular component, +4 mm neutral Pinnacle Altrx polyethylene insert, and a 36 mm M-SPEC +1.5 mm hip ball  Treatments: none  Discharge Exam: Blood pressure 134/76, pulse 60, temperature 97.8 F (36.6 C), temperature source Temporal, resp. rate 18, height 5\' 10"  (1.778 m), weight 97.5 kg, SpO2 100  %.   Disposition: Discharge disposition: 01-Home or Self Care     home   Allergies as of 12/28/2022   No Known Allergies      Medication List     TAKE these medications    acetaminophen 500 MG tablet Commonly known as: TYLENOL Take 1,000 mg by mouth every 6 (six) hours as needed.   aspirin EC 81 MG tablet Take 1 tablet (81 mg total) by mouth in the morning and at bedtime. Swallow whole.   B-12 PO Take 1 capsule by mouth daily.   celecoxib 200 MG capsule Commonly known as: CELEBREX Take 1 capsule (200 mg total) by mouth 2 (two) times daily.   FISH OIL PO Take 2 capsules by mouth daily.   MSM 1000 MG Tabs Take 1 tablet by mouth daily.   oxyCODONE 5 MG immediate release tablet Commonly known as: Oxy IR/ROXICODONE Take 1 tablet (5 mg total) by mouth every 4 (four) hours as needed for moderate pain (pain score 4-6).   sildenafil 50 MG tablet Commonly known as: Viagra Take 1 tablet (50 mg total) by mouth daily as needed for erectile dysfunction.   traMADol 50 MG tablet Commonly known as: ULTRAM Take 1-2 tablets (50-100 mg total) by mouth every 4 (four) hours as needed for moderate pain.   VITAMIN C PO Take 1 tablet by mouth daily. With D3 and Zinc   VITAMIN D PO Take 1 capsule by mouth daily.   VITAMIN E PO Take 1-2 capsules by mouth daily.  Durable Medical Equipment  (From admission, onward)           Start     Ordered   12/27/22 1046  DME Walker rolling  Once       Question:  Patient needs a walker to treat with the following condition  Answer:  S/P total hip arthroplasty   12/27/22 1045   12/27/22 1046  DME Bedside commode  Once       Comments: Patient is not able to walk the distance required to go the bathroom, or he/she is unable to safely negotiate stairs required to access the bathroom.  A 3in1 BSC will alleviate this problem  Question:  Patient needs a bedside commode to treat with the following condition  Answer:  S/P  total hip arthroplasty   12/27/22 1045            Follow-up Information     Hooten, Illene Labrador, MD Follow up on 02/08/2023.   Specialty: Orthopedic Surgery Why: at 9:45am Contact information: [address] [facility] [address] [place] [address] [phone]                 Signed: Gean Birchwood 12/28/2022, 8:47 AM   Objective: Vital signs in last 24 hours: Temp:  [97.2 F (36.2 C)-98.1 F (36.7 C)] 97.8 F (36.6 C) (06/04 0538) Pulse Rate:  [60-87] 60 (06/04 0538) Resp:  [10-20] 18 (06/04 0538) BP: (113-134)/(68-88) 134/76 (06/04 0538) SpO2:  [96 %-100 %] 100 % (06/04 0538) Weight:  [97.5 kg] 97.5 kg (06/03 1104)  Intake/Output from previous day:  Intake/Output Summary (Last 24 hours) at 12/28/2022 0847 Last data filed at 12/28/2022 0524 Gross per 24 hour  Intake 2660 ml  Output 1430 ml  Net 1230 ml    Intake/Output this shift: No intake/output data recorded.  Labs: No results for input(s): "HGB" in the last 72 hours. No results for input(s): "WBC", "RBC", "HCT", "PLT" in the last 72 hours. No results for input(s): "NA", "K", "CL", "CO2", "BUN", "CREATININE", "GLUCOSE", "CALCIUM" in the last 72 hours. No results for input(s): "LABPT", "INR" in the last 72 hours.  EXAM: General - Patient is Alert, Appropriate, and Oriented Extremity - Neurologically intact ABD soft Neurovascular intact Sensation intact distally Intact pulses distally Dorsiflexion/Plantar flexion intact No cellulitis present Compartment soft Dressing - dressing C/D/I and no drainage Motor Function - intact, moving foot and toes well on exam. Patient presented on 12/27/2022 to the hospital for an elective left total hip arthroplasty patient is able to dorsi and plantarflex with good range of motion and strength.  Patient is neurovascularly intact to all lower leg dermatomes.  Posterior tibial pulses appreciated 2+.  Patient has some difficulty with SLR JP drain pulled without  difficulty. Intact  Assessment/Plan: 1 Day Post-Op Procedure(s) (LRB): TOTAL HIP ARTHROPLASTY (Right) Procedure(s) (LRB): TOTAL HIP ARTHROPLASTY (Right) Past Medical History:  Diagnosis Date   Anxiety    Aortic atherosclerosis (HCC)    Arthritis    BPH with obstruction/lower urinary tract symptoms 06/15/2015   COVID 2020   Depression    Erectile dysfunction    a.) on PDE5i (sildenafil)   GERD (gastroesophageal reflux disease)    History of kidney stones    HLD (hyperlipidemia)    HTN (hypertension)    Migraines    Nephrolithiasis    OSA on CPAP    Prostatism 01/13/2015   Principal Problem:   Hx of total hip arthroplasty, right  Estimated body mass index is 30.84 kg/m  as calculated from the following:   Height as of this encounter: 5\' 10"  (1.778 m).   Weight as of this encounter: 97.5 kg. Advance diet Patient has passed all of her PT protocols from the hospital and is cleared for discharge.  Continue to work with at home physical therapy.   DVT Prophylaxis - Aspirin 81 mg twice daily, TED hose on both legs Weight Bearing As Tolerated left leg JP drain pulled, intact Hip Preacutions discussed   Discussed the medications that patient will be sent home with, including Celebrex for swelling and inflammation.  As well as tramadol and oxycodone for pain control.  Also discussed taking aspirin 81 mg twice a day for DVT prophylaxis.   Urged the patient to continue to wear her TED hose on both legs   Continue to use ice as needed to help with swelling and inflammation   Aquacel bandage stays in place for 6 more days.  Physical therapy will help change dressings at home.  Urged the patient to call if she has any concerns   Disposition - Home Condition Upon Discharge - Good Patient will follow-up with [facility] clinic orthopedics in 6 weeks for reevaluation and reimaging  Danise Edge, PA-C Orthopaedic Surgery 12/28/2022, 8:47 AM

## 2022-12-28 NOTE — Progress Notes (Signed)
Subjective: 1 Day Post-Op Procedure(s) (LRB): TOTAL HIP ARTHROPLASTY (Right) Patient reports pain as mild states it is worse in his knee than his hip.   Patient seen in rounds with Dr. Ernest Pine. Patient is well, and has had no acute complaints or problems We will start therapy today.  Plan is to go Home after hospital stay.  Objective: Vital signs in last 24 hours: Temp:  [97.2 F (36.2 C)-98.1 F (36.7 C)] 97.8 F (36.6 C) (06/04 0538) Pulse Rate:  [60-87] 60 (06/04 0538) Resp:  [10-20] 18 (06/04 0538) BP: (113-134)/(68-88) 134/76 (06/04 0538) SpO2:  [96 %-100 %] 100 % (06/04 0538) Weight:  [97.5 kg] 97.5 kg (06/03 1104)  Intake/Output from previous day:  Intake/Output Summary (Last 24 hours) at 12/28/2022 0813 Last data filed at 12/28/2022 0524 Gross per 24 hour  Intake 2660 ml  Output 1930 ml  Net 730 ml    Intake/Output this shift: No intake/output data recorded.  Labs: No results for input(s): "HGB" in the last 72 hours. No results for input(s): "WBC", "RBC", "HCT", "PLT" in the last 72 hours. No results for input(s): "NA", "K", "CL", "CO2", "BUN", "CREATININE", "GLUCOSE", "CALCIUM" in the last 72 hours. No results for input(s): "LABPT", "INR" in the last 72 hours.  EXAM General - Patient is Alert, Appropriate, and Oriented Extremity - Neurologically intact ABD soft Neurovascular intact Sensation intact distally Intact pulses distally Dorsiflexion/Plantar flexion intact No cellulitis present Compartment soft Dressing - dressing C/D/I and no drainage Motor Function - intact, moving foot and toes well on exam. Patient presented on 12/27/2022 to the hospital for an elective left total hip arthroplasty patient is able to dorsi and plantarflex with good range of motion and strength.  Patient is neurovascularly intact to all lower leg dermatomes.  Posterior tibial pulses appreciated 2+.  Patient has some difficulty with SLR JP drain pulled without difficulty. Intact  Past  Medical History:  Diagnosis Date   Anxiety    Aortic atherosclerosis (HCC)    Arthritis    BPH with obstruction/lower urinary tract symptoms 06/15/2015   COVID 2020   Depression    Erectile dysfunction    a.) on PDE5i (sildenafil)   GERD (gastroesophageal reflux disease)    History of kidney stones    HLD (hyperlipidemia)    HTN (hypertension)    Migraines    Nephrolithiasis    OSA on CPAP    Prostatism 01/13/2015    Assessment/Plan: 1 Day Post-Op Procedure(s) (LRB): TOTAL HIP ARTHROPLASTY (Right) Principal Problem:   Hx of total hip arthroplasty, right  Estimated body mass index is 30.84 kg/m as calculated from the following:   Height as of this encounter: 5\' 10"  (1.778 m).   Weight as of this encounter: 97.5 kg. Advance diet Up with therapy -once patient is able to pass her PT protocols, she will be stable for discharge.   DVT Prophylaxis - Aspirin 81 mg twice daily, TED hose, SCDs Weight Bearing As Tolerated left leg JP drain pulled Continue with Therapy Hip Preacutions discussed   Discussed the medications that patient will be sent home with, including Celebrex for swelling and inflammation.  As well as tramadol and oxycodone for pain control.  Also discussed taking aspirin 81 mg twice a day for DVT prophylaxis.   Urged the patient to continue to wear her TED hose on both legs   Continue to use ice as needed to help with swelling and inflammation   Plan to work with physical therapy at home upon  discharge.   Aquacel bandage stays in place for 6 more days.  Physical therapy will help change dressings at home.  Urged the patient to call if she has any concerns  Rayburn Go, PA-C [facility] 12/28/2022, 8:13 AM

## 2022-12-28 NOTE — Progress Notes (Signed)
DISCHARGE NOTE:  Pt given discharge instructions and scripts. Pt verbalized understanding. TED hose on both legs. BSC and walker sent with pt. Pt wheeled to car by staff. Family providing transportation.

## 2022-12-28 NOTE — TOC Transition Note (Signed)
Transition of Care [facility]) - CM/SW Discharge Note   Patient Details  Name: Daniel Garner MRN: [id number] Date of Birth: 1955-12-19  Transition of Care [facility]) CM/SW Contact:  Garret Reddish, RN Phone Number: 12/28/2022, 12:30 PM   Clinical Narrative:    Chart reviewed.  Noted that patient has orders for discharge today.  Patient has been pre-arranged with [facility] for PT and OT needs.  Patient would like to continue to use [facility] for [facility] needs.  I have informed [place] with [facility] that patient will be a discharge home for today    PT has recommended that patient have a 2 wheeled rolling walker.  Patient will not require a BSC. I have asked Barbara Cower with [employer] to provide patient with a 2 wheeled rolling walker.  Barbara Cower will provide the 2 wheeled rolling walker at bedside.    I have informed staff nurse of the above information.     Final next level of care: Home w Home Health Services Barriers to Discharge: No Barriers Identified   Patient Goals and CMS Choice CMS Medicare.gov Compare Post Acute Care list provided to:: Patient Choice offered to / list presented to : Patient  Discharge Placement                      Patient and family notified of of transfer: 12/28/22  Discharge Plan and Services Additional resources added to the After Visit Summary for                  DME Arranged: Walker rolling DME Agency: [facility] Date DME Agency Contacted: 12/28/22 Time DME Agency Contacted: 1000 Representative spoke with at DME Agency: Barbara Cower HH Arranged: PT, OT HH Agency: [facility] Date [facility] Agency Contacted: 12/28/22 Time HH Agency Contacted: 1000 Representative spoke with at [facility] Agency: [place]  Social Determinants of Health (SDOH) Interventions SDOH Screenings   Food Insecurity: No Food Insecurity (12/27/2022)  Housing: Low Risk  (12/27/2022)  Transportation Needs: No Transportation Needs (12/27/2022)  Utilities: Not At Risk (12/27/2022)  Alcohol  Screen: Low Risk  (11/16/2022)  Depression (PHQ2-9): Low Risk  (11/16/2022)  Financial Resource Strain: Low Risk  (11/16/2022)  Physical Activity: Sufficiently Active (11/16/2022)  Social Connections: Socially Integrated (11/16/2022)  Stress: No Stress Concern Present (11/16/2022)  Tobacco Use: Low Risk  (12/27/2022)     Readmission Risk Interventions     No data to display

## 2022-12-29 DIAGNOSIS — I10 Essential (primary) hypertension: Secondary | ICD-10-CM | POA: Diagnosis not present

## 2022-12-29 DIAGNOSIS — Z791 Long term (current) use of non-steroidal anti-inflammatories (NSAID): Secondary | ICD-10-CM | POA: Diagnosis not present

## 2022-12-29 DIAGNOSIS — E785 Hyperlipidemia, unspecified: Secondary | ICD-10-CM | POA: Diagnosis not present

## 2022-12-29 DIAGNOSIS — Z96641 Presence of right artificial hip joint: Secondary | ICD-10-CM | POA: Diagnosis not present

## 2022-12-29 DIAGNOSIS — Z471 Aftercare following joint replacement surgery: Secondary | ICD-10-CM | POA: Diagnosis not present

## 2022-12-29 DIAGNOSIS — K219 Gastro-esophageal reflux disease without esophagitis: Secondary | ICD-10-CM | POA: Diagnosis not present

## 2022-12-29 DIAGNOSIS — G43909 Migraine, unspecified, not intractable, without status migrainosus: Secondary | ICD-10-CM | POA: Diagnosis not present

## 2022-12-29 DIAGNOSIS — Z87442 Personal history of urinary calculi: Secondary | ICD-10-CM | POA: Diagnosis not present

## 2022-12-29 DIAGNOSIS — Z7982 Long term (current) use of aspirin: Secondary | ICD-10-CM | POA: Diagnosis not present

## 2022-12-29 DIAGNOSIS — F32A Depression, unspecified: Secondary | ICD-10-CM | POA: Diagnosis not present

## 2022-12-29 DIAGNOSIS — I700 Atherosclerosis of aorta: Secondary | ICD-10-CM | POA: Diagnosis not present

## 2022-12-29 DIAGNOSIS — N529 Male erectile dysfunction, unspecified: Secondary | ICD-10-CM | POA: Diagnosis not present

## 2022-12-29 DIAGNOSIS — G4733 Obstructive sleep apnea (adult) (pediatric): Secondary | ICD-10-CM | POA: Diagnosis not present

## 2022-12-29 DIAGNOSIS — F419 Anxiety disorder, unspecified: Secondary | ICD-10-CM | POA: Diagnosis not present

## 2022-12-29 DIAGNOSIS — N400 Enlarged prostate without lower urinary tract symptoms: Secondary | ICD-10-CM | POA: Diagnosis not present

## 2022-12-29 DIAGNOSIS — Z8616 Personal history of COVID-19: Secondary | ICD-10-CM | POA: Diagnosis not present

## 2022-12-31 LAB — SURGICAL PATHOLOGY

## 2023-01-03 DIAGNOSIS — G43909 Migraine, unspecified, not intractable, without status migrainosus: Secondary | ICD-10-CM | POA: Diagnosis not present

## 2023-01-03 DIAGNOSIS — Z791 Long term (current) use of non-steroidal anti-inflammatories (NSAID): Secondary | ICD-10-CM | POA: Diagnosis not present

## 2023-01-03 DIAGNOSIS — F419 Anxiety disorder, unspecified: Secondary | ICD-10-CM | POA: Diagnosis not present

## 2023-01-03 DIAGNOSIS — E785 Hyperlipidemia, unspecified: Secondary | ICD-10-CM | POA: Diagnosis not present

## 2023-01-03 DIAGNOSIS — Z8616 Personal history of COVID-19: Secondary | ICD-10-CM | POA: Diagnosis not present

## 2023-01-03 DIAGNOSIS — Z7982 Long term (current) use of aspirin: Secondary | ICD-10-CM | POA: Diagnosis not present

## 2023-01-03 DIAGNOSIS — I10 Essential (primary) hypertension: Secondary | ICD-10-CM | POA: Diagnosis not present

## 2023-01-03 DIAGNOSIS — Z96641 Presence of right artificial hip joint: Secondary | ICD-10-CM | POA: Diagnosis not present

## 2023-01-03 DIAGNOSIS — N529 Male erectile dysfunction, unspecified: Secondary | ICD-10-CM | POA: Diagnosis not present

## 2023-01-03 DIAGNOSIS — F32A Depression, unspecified: Secondary | ICD-10-CM | POA: Diagnosis not present

## 2023-01-03 DIAGNOSIS — K219 Gastro-esophageal reflux disease without esophagitis: Secondary | ICD-10-CM | POA: Diagnosis not present

## 2023-01-03 DIAGNOSIS — Z87442 Personal history of urinary calculi: Secondary | ICD-10-CM | POA: Diagnosis not present

## 2023-01-03 DIAGNOSIS — I700 Atherosclerosis of aorta: Secondary | ICD-10-CM | POA: Diagnosis not present

## 2023-01-03 DIAGNOSIS — N400 Enlarged prostate without lower urinary tract symptoms: Secondary | ICD-10-CM | POA: Diagnosis not present

## 2023-01-03 DIAGNOSIS — Z471 Aftercare following joint replacement surgery: Secondary | ICD-10-CM | POA: Diagnosis not present

## 2023-01-03 DIAGNOSIS — G4733 Obstructive sleep apnea (adult) (pediatric): Secondary | ICD-10-CM | POA: Diagnosis not present

## 2023-01-06 DIAGNOSIS — Z8616 Personal history of COVID-19: Secondary | ICD-10-CM | POA: Diagnosis not present

## 2023-01-06 DIAGNOSIS — N529 Male erectile dysfunction, unspecified: Secondary | ICD-10-CM | POA: Diagnosis not present

## 2023-01-06 DIAGNOSIS — Z96641 Presence of right artificial hip joint: Secondary | ICD-10-CM | POA: Diagnosis not present

## 2023-01-06 DIAGNOSIS — Z791 Long term (current) use of non-steroidal anti-inflammatories (NSAID): Secondary | ICD-10-CM | POA: Diagnosis not present

## 2023-01-06 DIAGNOSIS — I10 Essential (primary) hypertension: Secondary | ICD-10-CM | POA: Diagnosis not present

## 2023-01-06 DIAGNOSIS — F419 Anxiety disorder, unspecified: Secondary | ICD-10-CM | POA: Diagnosis not present

## 2023-01-06 DIAGNOSIS — I700 Atherosclerosis of aorta: Secondary | ICD-10-CM | POA: Diagnosis not present

## 2023-01-06 DIAGNOSIS — K219 Gastro-esophageal reflux disease without esophagitis: Secondary | ICD-10-CM | POA: Diagnosis not present

## 2023-01-06 DIAGNOSIS — E785 Hyperlipidemia, unspecified: Secondary | ICD-10-CM | POA: Diagnosis not present

## 2023-01-06 DIAGNOSIS — Z471 Aftercare following joint replacement surgery: Secondary | ICD-10-CM | POA: Diagnosis not present

## 2023-01-06 DIAGNOSIS — G4733 Obstructive sleep apnea (adult) (pediatric): Secondary | ICD-10-CM | POA: Diagnosis not present

## 2023-01-06 DIAGNOSIS — F32A Depression, unspecified: Secondary | ICD-10-CM | POA: Diagnosis not present

## 2023-01-06 DIAGNOSIS — Z87442 Personal history of urinary calculi: Secondary | ICD-10-CM | POA: Diagnosis not present

## 2023-01-06 DIAGNOSIS — G43909 Migraine, unspecified, not intractable, without status migrainosus: Secondary | ICD-10-CM | POA: Diagnosis not present

## 2023-01-06 DIAGNOSIS — Z7982 Long term (current) use of aspirin: Secondary | ICD-10-CM | POA: Diagnosis not present

## 2023-01-06 DIAGNOSIS — N400 Enlarged prostate without lower urinary tract symptoms: Secondary | ICD-10-CM | POA: Diagnosis not present

## 2023-01-10 DIAGNOSIS — E785 Hyperlipidemia, unspecified: Secondary | ICD-10-CM | POA: Diagnosis not present

## 2023-01-10 DIAGNOSIS — Z96641 Presence of right artificial hip joint: Secondary | ICD-10-CM | POA: Diagnosis not present

## 2023-01-10 DIAGNOSIS — I10 Essential (primary) hypertension: Secondary | ICD-10-CM | POA: Diagnosis not present

## 2023-01-10 DIAGNOSIS — F32A Depression, unspecified: Secondary | ICD-10-CM | POA: Diagnosis not present

## 2023-01-10 DIAGNOSIS — F419 Anxiety disorder, unspecified: Secondary | ICD-10-CM | POA: Diagnosis not present

## 2023-01-10 DIAGNOSIS — G4733 Obstructive sleep apnea (adult) (pediatric): Secondary | ICD-10-CM | POA: Diagnosis not present

## 2023-01-10 DIAGNOSIS — K219 Gastro-esophageal reflux disease without esophagitis: Secondary | ICD-10-CM | POA: Diagnosis not present

## 2023-01-10 DIAGNOSIS — Z7982 Long term (current) use of aspirin: Secondary | ICD-10-CM | POA: Diagnosis not present

## 2023-01-10 DIAGNOSIS — I700 Atherosclerosis of aorta: Secondary | ICD-10-CM | POA: Diagnosis not present

## 2023-01-10 DIAGNOSIS — N529 Male erectile dysfunction, unspecified: Secondary | ICD-10-CM | POA: Diagnosis not present

## 2023-01-10 DIAGNOSIS — N400 Enlarged prostate without lower urinary tract symptoms: Secondary | ICD-10-CM | POA: Diagnosis not present

## 2023-01-10 DIAGNOSIS — Z8616 Personal history of COVID-19: Secondary | ICD-10-CM | POA: Diagnosis not present

## 2023-01-10 DIAGNOSIS — Z471 Aftercare following joint replacement surgery: Secondary | ICD-10-CM | POA: Diagnosis not present

## 2023-01-10 DIAGNOSIS — G43909 Migraine, unspecified, not intractable, without status migrainosus: Secondary | ICD-10-CM | POA: Diagnosis not present

## 2023-01-10 DIAGNOSIS — Z87442 Personal history of urinary calculi: Secondary | ICD-10-CM | POA: Diagnosis not present

## 2023-01-10 DIAGNOSIS — Z791 Long term (current) use of non-steroidal anti-inflammatories (NSAID): Secondary | ICD-10-CM | POA: Diagnosis not present

## 2023-01-13 DIAGNOSIS — E785 Hyperlipidemia, unspecified: Secondary | ICD-10-CM | POA: Diagnosis not present

## 2023-01-13 DIAGNOSIS — Z87442 Personal history of urinary calculi: Secondary | ICD-10-CM | POA: Diagnosis not present

## 2023-01-13 DIAGNOSIS — I10 Essential (primary) hypertension: Secondary | ICD-10-CM | POA: Diagnosis not present

## 2023-01-13 DIAGNOSIS — Z8616 Personal history of COVID-19: Secondary | ICD-10-CM | POA: Diagnosis not present

## 2023-01-13 DIAGNOSIS — F32A Depression, unspecified: Secondary | ICD-10-CM | POA: Diagnosis not present

## 2023-01-13 DIAGNOSIS — N400 Enlarged prostate without lower urinary tract symptoms: Secondary | ICD-10-CM | POA: Diagnosis not present

## 2023-01-13 DIAGNOSIS — Z96641 Presence of right artificial hip joint: Secondary | ICD-10-CM | POA: Diagnosis not present

## 2023-01-13 DIAGNOSIS — G4733 Obstructive sleep apnea (adult) (pediatric): Secondary | ICD-10-CM | POA: Diagnosis not present

## 2023-01-13 DIAGNOSIS — Z471 Aftercare following joint replacement surgery: Secondary | ICD-10-CM | POA: Diagnosis not present

## 2023-01-13 DIAGNOSIS — Z791 Long term (current) use of non-steroidal anti-inflammatories (NSAID): Secondary | ICD-10-CM | POA: Diagnosis not present

## 2023-01-13 DIAGNOSIS — I700 Atherosclerosis of aorta: Secondary | ICD-10-CM | POA: Diagnosis not present

## 2023-01-13 DIAGNOSIS — K219 Gastro-esophageal reflux disease without esophagitis: Secondary | ICD-10-CM | POA: Diagnosis not present

## 2023-01-13 DIAGNOSIS — N529 Male erectile dysfunction, unspecified: Secondary | ICD-10-CM | POA: Diagnosis not present

## 2023-01-13 DIAGNOSIS — Z7982 Long term (current) use of aspirin: Secondary | ICD-10-CM | POA: Diagnosis not present

## 2023-01-13 DIAGNOSIS — F419 Anxiety disorder, unspecified: Secondary | ICD-10-CM | POA: Diagnosis not present

## 2023-01-13 DIAGNOSIS — G43909 Migraine, unspecified, not intractable, without status migrainosus: Secondary | ICD-10-CM | POA: Diagnosis not present

## 2023-01-17 DIAGNOSIS — Z471 Aftercare following joint replacement surgery: Secondary | ICD-10-CM | POA: Diagnosis not present

## 2023-01-17 DIAGNOSIS — F32A Depression, unspecified: Secondary | ICD-10-CM | POA: Diagnosis not present

## 2023-01-17 DIAGNOSIS — N529 Male erectile dysfunction, unspecified: Secondary | ICD-10-CM | POA: Diagnosis not present

## 2023-01-17 DIAGNOSIS — K219 Gastro-esophageal reflux disease without esophagitis: Secondary | ICD-10-CM | POA: Diagnosis not present

## 2023-01-17 DIAGNOSIS — E785 Hyperlipidemia, unspecified: Secondary | ICD-10-CM | POA: Diagnosis not present

## 2023-01-17 DIAGNOSIS — G43909 Migraine, unspecified, not intractable, without status migrainosus: Secondary | ICD-10-CM | POA: Diagnosis not present

## 2023-01-17 DIAGNOSIS — F419 Anxiety disorder, unspecified: Secondary | ICD-10-CM | POA: Diagnosis not present

## 2023-01-17 DIAGNOSIS — G4733 Obstructive sleep apnea (adult) (pediatric): Secondary | ICD-10-CM | POA: Diagnosis not present

## 2023-01-17 DIAGNOSIS — I10 Essential (primary) hypertension: Secondary | ICD-10-CM | POA: Diagnosis not present

## 2023-01-17 DIAGNOSIS — I700 Atherosclerosis of aorta: Secondary | ICD-10-CM | POA: Diagnosis not present

## 2023-01-19 DIAGNOSIS — K219 Gastro-esophageal reflux disease without esophagitis: Secondary | ICD-10-CM | POA: Diagnosis not present

## 2023-01-19 DIAGNOSIS — I700 Atherosclerosis of aorta: Secondary | ICD-10-CM | POA: Diagnosis not present

## 2023-01-19 DIAGNOSIS — G4733 Obstructive sleep apnea (adult) (pediatric): Secondary | ICD-10-CM | POA: Diagnosis not present

## 2023-01-19 DIAGNOSIS — Z7982 Long term (current) use of aspirin: Secondary | ICD-10-CM | POA: Diagnosis not present

## 2023-01-19 DIAGNOSIS — F32A Depression, unspecified: Secondary | ICD-10-CM | POA: Diagnosis not present

## 2023-01-19 DIAGNOSIS — F419 Anxiety disorder, unspecified: Secondary | ICD-10-CM | POA: Diagnosis not present

## 2023-01-19 DIAGNOSIS — Z8616 Personal history of COVID-19: Secondary | ICD-10-CM | POA: Diagnosis not present

## 2023-01-19 DIAGNOSIS — N400 Enlarged prostate without lower urinary tract symptoms: Secondary | ICD-10-CM | POA: Diagnosis not present

## 2023-01-19 DIAGNOSIS — Z471 Aftercare following joint replacement surgery: Secondary | ICD-10-CM | POA: Diagnosis not present

## 2023-01-19 DIAGNOSIS — Z791 Long term (current) use of non-steroidal anti-inflammatories (NSAID): Secondary | ICD-10-CM | POA: Diagnosis not present

## 2023-01-19 DIAGNOSIS — N529 Male erectile dysfunction, unspecified: Secondary | ICD-10-CM | POA: Diagnosis not present

## 2023-01-19 DIAGNOSIS — E785 Hyperlipidemia, unspecified: Secondary | ICD-10-CM | POA: Diagnosis not present

## 2023-01-19 DIAGNOSIS — G43909 Migraine, unspecified, not intractable, without status migrainosus: Secondary | ICD-10-CM | POA: Diagnosis not present

## 2023-01-19 DIAGNOSIS — I10 Essential (primary) hypertension: Secondary | ICD-10-CM | POA: Diagnosis not present

## 2023-01-19 DIAGNOSIS — Z96641 Presence of right artificial hip joint: Secondary | ICD-10-CM | POA: Diagnosis not present

## 2023-01-19 DIAGNOSIS — Z87442 Personal history of urinary calculi: Secondary | ICD-10-CM | POA: Diagnosis not present

## 2023-01-21 DIAGNOSIS — Z471 Aftercare following joint replacement surgery: Secondary | ICD-10-CM | POA: Diagnosis not present

## 2023-01-24 DIAGNOSIS — G43909 Migraine, unspecified, not intractable, without status migrainosus: Secondary | ICD-10-CM | POA: Diagnosis not present

## 2023-01-24 DIAGNOSIS — K219 Gastro-esophageal reflux disease without esophagitis: Secondary | ICD-10-CM | POA: Diagnosis not present

## 2023-01-24 DIAGNOSIS — E785 Hyperlipidemia, unspecified: Secondary | ICD-10-CM | POA: Diagnosis not present

## 2023-01-24 DIAGNOSIS — F32A Depression, unspecified: Secondary | ICD-10-CM | POA: Diagnosis not present

## 2023-01-24 DIAGNOSIS — I700 Atherosclerosis of aorta: Secondary | ICD-10-CM | POA: Diagnosis not present

## 2023-01-24 DIAGNOSIS — I10 Essential (primary) hypertension: Secondary | ICD-10-CM | POA: Diagnosis not present

## 2023-01-24 DIAGNOSIS — G4733 Obstructive sleep apnea (adult) (pediatric): Secondary | ICD-10-CM | POA: Diagnosis not present

## 2023-01-24 DIAGNOSIS — Z471 Aftercare following joint replacement surgery: Secondary | ICD-10-CM | POA: Diagnosis not present

## 2023-01-24 DIAGNOSIS — F419 Anxiety disorder, unspecified: Secondary | ICD-10-CM | POA: Diagnosis not present

## 2023-01-24 DIAGNOSIS — N529 Male erectile dysfunction, unspecified: Secondary | ICD-10-CM | POA: Diagnosis not present

## 2023-01-31 DIAGNOSIS — E785 Hyperlipidemia, unspecified: Secondary | ICD-10-CM | POA: Diagnosis not present

## 2023-01-31 DIAGNOSIS — G4733 Obstructive sleep apnea (adult) (pediatric): Secondary | ICD-10-CM | POA: Diagnosis not present

## 2023-01-31 DIAGNOSIS — I700 Atherosclerosis of aorta: Secondary | ICD-10-CM | POA: Diagnosis not present

## 2023-01-31 DIAGNOSIS — I10 Essential (primary) hypertension: Secondary | ICD-10-CM | POA: Diagnosis not present

## 2023-01-31 DIAGNOSIS — K219 Gastro-esophageal reflux disease without esophagitis: Secondary | ICD-10-CM | POA: Diagnosis not present

## 2023-01-31 DIAGNOSIS — Z471 Aftercare following joint replacement surgery: Secondary | ICD-10-CM | POA: Diagnosis not present

## 2023-01-31 DIAGNOSIS — F419 Anxiety disorder, unspecified: Secondary | ICD-10-CM | POA: Diagnosis not present

## 2023-01-31 DIAGNOSIS — F32A Depression, unspecified: Secondary | ICD-10-CM | POA: Diagnosis not present

## 2023-01-31 DIAGNOSIS — G43909 Migraine, unspecified, not intractable, without status migrainosus: Secondary | ICD-10-CM | POA: Diagnosis not present

## 2023-01-31 DIAGNOSIS — N529 Male erectile dysfunction, unspecified: Secondary | ICD-10-CM | POA: Diagnosis not present

## 2023-02-03 DIAGNOSIS — Z96641 Presence of right artificial hip joint: Secondary | ICD-10-CM | POA: Diagnosis not present

## 2023-02-07 DIAGNOSIS — I700 Atherosclerosis of aorta: Secondary | ICD-10-CM | POA: Diagnosis not present

## 2023-02-07 DIAGNOSIS — F32A Depression, unspecified: Secondary | ICD-10-CM | POA: Diagnosis not present

## 2023-02-07 DIAGNOSIS — Z471 Aftercare following joint replacement surgery: Secondary | ICD-10-CM | POA: Diagnosis not present

## 2023-02-07 DIAGNOSIS — I10 Essential (primary) hypertension: Secondary | ICD-10-CM | POA: Diagnosis not present

## 2023-02-07 DIAGNOSIS — E785 Hyperlipidemia, unspecified: Secondary | ICD-10-CM | POA: Diagnosis not present

## 2023-02-07 DIAGNOSIS — G4733 Obstructive sleep apnea (adult) (pediatric): Secondary | ICD-10-CM | POA: Diagnosis not present

## 2023-02-07 DIAGNOSIS — G43909 Migraine, unspecified, not intractable, without status migrainosus: Secondary | ICD-10-CM | POA: Diagnosis not present

## 2023-02-07 DIAGNOSIS — N529 Male erectile dysfunction, unspecified: Secondary | ICD-10-CM | POA: Diagnosis not present

## 2023-02-07 DIAGNOSIS — K219 Gastro-esophageal reflux disease without esophagitis: Secondary | ICD-10-CM | POA: Diagnosis not present

## 2023-02-07 DIAGNOSIS — F419 Anxiety disorder, unspecified: Secondary | ICD-10-CM | POA: Diagnosis not present

## 2023-02-15 DIAGNOSIS — G4733 Obstructive sleep apnea (adult) (pediatric): Secondary | ICD-10-CM | POA: Diagnosis not present

## 2023-02-15 DIAGNOSIS — I10 Essential (primary) hypertension: Secondary | ICD-10-CM | POA: Diagnosis not present

## 2023-02-15 DIAGNOSIS — F419 Anxiety disorder, unspecified: Secondary | ICD-10-CM | POA: Diagnosis not present

## 2023-02-15 DIAGNOSIS — F32A Depression, unspecified: Secondary | ICD-10-CM | POA: Diagnosis not present

## 2023-02-15 DIAGNOSIS — G43909 Migraine, unspecified, not intractable, without status migrainosus: Secondary | ICD-10-CM | POA: Diagnosis not present

## 2023-02-15 DIAGNOSIS — E785 Hyperlipidemia, unspecified: Secondary | ICD-10-CM | POA: Diagnosis not present

## 2023-02-15 DIAGNOSIS — I700 Atherosclerosis of aorta: Secondary | ICD-10-CM | POA: Diagnosis not present

## 2023-02-15 DIAGNOSIS — K219 Gastro-esophageal reflux disease without esophagitis: Secondary | ICD-10-CM | POA: Diagnosis not present

## 2023-02-15 DIAGNOSIS — Z471 Aftercare following joint replacement surgery: Secondary | ICD-10-CM | POA: Diagnosis not present

## 2023-02-15 DIAGNOSIS — N529 Male erectile dysfunction, unspecified: Secondary | ICD-10-CM | POA: Diagnosis not present

## 2023-05-17 NOTE — Progress Notes (Deleted)
Name: Daniel Garner   MRN: [id number]    DOB: 07-02-1956   Date:05/17/2023       Progress Note  Subjective  Chief Complaint  Follow up  HPI   Patient Active Problem List   Diagnosis Date Noted   Hx of total hip arthroplasty, right 12/27/2022   Vivid dream 03/17/2022   Mild episode of recurrent major depressive disorder (HCC) 03/17/2022   Atherosclerosis of aorta (HCC) 06/09/2021   Lower urinary tract symptoms (LUTS) 06/09/2021   Kidney stone on right side 06/09/2021   Constipation 04/06/2021   Osteoarthritis, hip, bilateral 05/27/2017   Dyslipidemia 08/22/2016   Chondromalacia of knee, 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   Sleep apnea 08/16/2016   History of migraine 08/16/2016   ED (erectile dysfunction) 08/16/2016   GERD without esophagitis 08/16/2016   Prostatism 01/13/2015    Past Surgical History:  Procedure Laterality Date   COLONOSCOPY  07/26/1998   COLONOSCOPY WITH PROPOFOL N/A 08/06/2015   Procedure: COLONOSCOPY WITH PROPOFOL;  Surgeon: Kieth Brightly, MD;  Location: [facility] ENDOSCOPY;  Service: Endoscopy;  Laterality: N/A;   COLONOSCOPY WITH PROPOFOL N/A 09/02/2021   Procedure: COLONOSCOPY WITH PROPOFOL;  Surgeon: Wyline Mood, MD;  Location: [facility] ENDOSCOPY;  Service: Gastroenterology;  Laterality: N/A;   HERNIA REPAIR  [id number]   INSERTION OF MESH  09/03/2021   Procedure: INSERTION OF MESH;  Surgeon: Henrene Dodge, MD;  Location: [facility] ORS;  Service: General;;   POLYPECTOMY     TONSILLECTOMY     TOTAL HIP ARTHROPLASTY Right 12/27/2022   Procedure: TOTAL HIP ARTHROPLASTY;  Surgeon: Donato Heinz, MD;  Location: [facility] ORS;  Service: Orthopedics;  Laterality: Right;   UMBILICAL HERNIA REPAIR N/A 09/03/2021   Procedure: HERNIA REPAIR UMBILICAL ADULT, incarcerated, open;  Surgeon: Henrene Dodge, MD;  Location: [facility] ORS;  Service: General;  Laterality: N/A;    Family History  Problem Relation Age of Onset   Arthritis Mother    Colon cancer Father    Prostate cancer Father     Lung cancer Father     Social History   Tobacco Use   Smoking status: Never   Smokeless tobacco: Never  Substance Use Topics   Alcohol use: Not Currently    Comment: > 36 yrs     Current Outpatient Medications:    acetaminophen (TYLENOL) 500 MG tablet, Take 1,000 mg by mouth every 6 (six) hours as needed., Disp: , Rfl:    Ascorbic Acid (VITAMIN C PO), Take 1 tablet by mouth daily. With D3 and Zinc, Disp: , Rfl:    aspirin EC 81 MG tablet, Take 1 tablet (81 mg total) by mouth in the morning and at bedtime. Swallow whole., Disp: 120 tablet, Rfl: 0   celecoxib (CELEBREX) 200 MG capsule, Take 1 capsule (200 mg total) by mouth 2 (two) times daily., Disp: 60 capsule, Rfl: 1   Cyanocobalamin (B-12 PO), Take 1 capsule by mouth daily., Disp: , Rfl:    Methylsulfonylmethane (MSM) 1000 MG TABS, Take 1 tablet by mouth daily., Disp: , Rfl:    Omega-3 Fatty Acids (FISH OIL PO), Take 2 capsules by mouth daily. (Patient not taking: Reported on 12/16/2022), Disp: , Rfl:    oxyCODONE (OXY IR/ROXICODONE) 5 MG immediate release tablet, Take 1 tablet (5 mg total) by mouth every 4 (four) hours as needed for moderate pain (pain score 4-6)., Disp: 30 tablet, Rfl: 0   sildenafil (VIAGRA) 50 MG tablet, Take 1 tablet (50 mg total) by mouth daily as needed for erectile dysfunction.,  Disp: 90 tablet, Rfl: 0   traMADol (ULTRAM) 50 MG tablet, Take 1-2 tablets (50-100 mg total) by mouth every 4 (four) hours as needed for moderate pain., Disp: 30 tablet, Rfl: 0   VITAMIN D PO, Take 1 capsule by mouth daily., Disp: , Rfl:    VITAMIN E PO, Take 1-2 capsules by mouth daily., Disp: , Rfl:   No Known Allergies  I personally reviewed {Reviewed:14835} with the patient/caregiver today.   ROS  ***  Objective  There were no vitals filed for this visit.  There is no height or weight on file to calculate BMI.  Physical Exam ***  No results found for this or any previous visit (from the past 2160  hour(s)).  Diabetic Foot Exam: Diabetic Foot Exam - Simple   No data filed    ***  PHQ2/9:    11/16/2022   10:24 AM 03/17/2022    1:45 PM 02/02/2022    1:22 PM 10/22/2021    9:58 AM 09/09/2021   10:48 AM  Depression screen PHQ 2/9  Decreased Interest 0 2 1 0 1  Down, Depressed, Hopeless 0 2 1 0 1  PHQ - 2 Score 0 4 2 0 2  Altered sleeping 0 3 0 0 3  Tired, decreased energy 0 2 1 0 3  Change in appetite 0 0 0 0 0  Feeling bad or failure about yourself  0 1 1 0 1  Trouble concentrating 0 0 0 0 0  Moving slowly or fidgety/restless 0 0 0 0 0  Suicidal thoughts 0 0 0 0 0  PHQ-9 Score 0 10 4 0 9  Difficult doing work/chores   Somewhat difficult Not difficult at all     phq 9 is {gen pos ZOX:096045} ***  Fall Risk:    11/16/2022   10:24 AM 03/17/2022    1:40 PM 02/02/2022    1:21 PM 10/22/2021    9:58 AM 09/21/2021    2:32 PM  Fall Risk   Falls in the past year? 0 0 0 0 0  Number falls in past yr: 0 0 0 0   Injury with Fall? 0 0 0 0   Risk for fall due to : No Fall Risks No Fall Risks No Fall Risks No Fall Risks   Follow up Falls prevention discussed Falls prevention discussed Falls prevention discussed;Education provided Falls prevention discussed    ***   Functional Status Survey:   ***   Assessment & Plan  *** There are no diagnoses linked to this encounter.

## 2023-05-18 ENCOUNTER — Ambulatory Visit: Payer: BC Managed Care – PPO | Admitting: Family Medicine

## 2023-05-18 DIAGNOSIS — Z23 Encounter for immunization: Secondary | ICD-10-CM

## 2023-08-15 ENCOUNTER — Encounter: Payer: Self-pay | Admitting: Physician Assistant

## 2023-08-15 ENCOUNTER — Ambulatory Visit: Payer: BC Managed Care – PPO | Admitting: Physician Assistant

## 2023-08-15 VITALS — BP 118/68 | HR 82 | Resp 16 | Ht 70.0 in | Wt 195.0 lb

## 2023-08-15 DIAGNOSIS — M5442 Lumbago with sciatica, left side: Secondary | ICD-10-CM | POA: Diagnosis not present

## 2023-08-15 MED ORDER — MELOXICAM 7.5 MG PO TABS
7.5000 mg | ORAL_TABLET | Freq: Every day | ORAL | 0 refills | Status: DC
Start: 2023-08-15 — End: 2023-09-06

## 2023-08-15 NOTE — Patient Instructions (Signed)
I recommend the following to help with your symptoms   Rest Warm compresses to the area (20 minutes on, minimum of 30 minutes off) You can alternate Tylenol and Ibuprofen for pain management but Ibuprofen is typically preferred to reduce inflammation.  I have sent in a script for Meloxicam for you to take once per day. DO NOT use other NSAIDs while taking this medication (ibuprofen, motrin, aleve, advil, naproxen, etc) Make sure you are drinking plenty of water with this and take with food.  Gentle stretches and exercises that I have included in your paperwork Try to reduce excess strain to the area and rest as much as possible  Wear supportive shoes and, if you must lift anything, use proper lifting techniques that spare your back.

## 2023-08-15 NOTE — Progress Notes (Unsigned)
Acute Office Visit   Patient: Daniel Garner   DOB: 1955-11-19   68 y.o. Male  MRN: [id number] Visit Date: 08/15/2023  Today's healthcare provider: Oswaldo Conroy Coolidge Gossard, PA-C  Introduced myself to the patient as a Secondary school teacher and provided education on APPs in clinical practice.    Chief Complaint  Patient presents with   Sciatic Pain    L side, x3-4 months   Subjective    HPI HPI     Sciatic Pain    Additional comments: L side, x3-4 months      Last edited by Dollene Primrose, CMA on 08/15/2023  1:07 PM.       Concern for left sided sciatica   Onset: gradual  Duration: ongoing since Oct  Location: low back,  left buttocks and left thigh, sometimes in left lower leg  Radiation: radiates along left lower extremity  Pain level and character: Avg 8-9/10 -states he can't work unless he takes Ibuprofen  Other associated symptoms: has noticed some swelling of feet  Interventions: Ibuprofen  Alleviating: sitting seems to provide relief  Aggravating: walking seems to aggravate it.   Denies changes in pain with bending or squatting  Denies recent injuries or trauma to the area    Medications: Outpatient Medications Prior to Visit  Medication Sig   acetaminophen (TYLENOL) 500 MG tablet Take 1,000 mg by mouth every 6 (six) hours as needed.   Ascorbic Acid (VITAMIN C PO) Take 1 tablet by mouth daily. With D3 and Zinc   aspirin EC 81 MG tablet Take 1 tablet (81 mg total) by mouth in the morning and at bedtime. Swallow whole.   Cyanocobalamin (B-12 PO) Take 1 capsule by mouth daily.   sildenafil (VIAGRA) 50 MG tablet Take 1 tablet (50 mg total) by mouth daily as needed for erectile dysfunction.   VITAMIN D PO Take 1 capsule by mouth daily.   VITAMIN E PO Take 1-2 capsules by mouth daily.   celecoxib (CELEBREX) 200 MG capsule Take 1 capsule (200 mg total) by mouth 2 (two) times daily. (Patient not taking: Reported on 08/15/2023)   Methylsulfonylmethane (MSM) 1000 MG TABS Take 1  tablet by mouth daily. (Patient not taking: Reported on 08/15/2023)   Omega-3 Fatty Acids (FISH OIL PO) Take 2 capsules by mouth daily. (Patient not taking: Reported on 12/16/2022)   oxyCODONE (OXY IR/ROXICODONE) 5 MG immediate release tablet Take 1 tablet (5 mg total) by mouth every 4 (four) hours as needed for moderate pain (pain score 4-6). (Patient not taking: Reported on 08/15/2023)   traMADol (ULTRAM) 50 MG tablet Take 1-2 tablets (50-100 mg total) by mouth every 4 (four) hours as needed for moderate pain. (Patient not taking: Reported on 08/15/2023)   No facility-administered medications prior to visit.    Review of Systems  Musculoskeletal:  Positive for back pain and myalgias.    {Insert previous labs (optional):23779} {See past labs  Heme  Chem  Endocrine  Serology  Results Review (optional):1}   Objective    BP 118/68   Pulse 82   Resp 16   Ht 5\' 10"  (1.778 m)   Wt 195 lb (88.5 kg)   SpO2 99%   BMI 27.98 kg/m  {Insert last BP/Wt (optional):23777}{See vitals history (optional):1}   Physical Exam Vitals reviewed.  Constitutional:      Appearance: Normal appearance.  HENT:     Head: Normocephalic and atraumatic.  Pulmonary:     Effort: Pulmonary  effort is normal.  Musculoskeletal:     Cervical back: Normal. Normal range of motion.     Thoracic back: Normal. Normal range of motion.     Lumbar back: Tenderness present. Negative right straight leg raise test and negative left straight leg raise test.     Comments: ROM findings Thoracic: Lateral flexion and lateral rotation are intact and symmetrical. Tenderness with lateral rotation to the left Lumbar: Extension, flexion are intact Hips: Extension, flexion, abduction, adduction are symmetrical and intact. Mild tenderness with extension of left leg at hip Knees: ROM intact with regards to flexion and extension, mild crepitus appreciated. Negative Apley grind on left knee Foot and ankle: Passive ROM is intact with mild  pain noted along lateral metatarsals of left foot   Skin:    General: Skin is warm and dry.  Neurological:     General: No focal deficit present.     Mental Status: He is alert and oriented to person, place, and time.  Psychiatric:        Mood and Affect: Mood normal.        Behavior: Behavior normal.        Thought Content: Thought content normal.       No results found for any visits on 08/15/23.  Assessment & Plan      No follow-ups on file.      Problem List Items Addressed This Visit   None Visit Diagnoses       Left-sided low back pain with left-sided sciatica, unspecified chronicity    -  Primary        No follow-ups on file.   I, Tyrihanna Wingert E Earnstine Meinders, PA-C, have reviewed all documentation for this visit. The documentation on 08/15/23 for the exam, diagnosis, procedures, and orders are all accurate and complete.   Jacquelin Hawking, MHS, PA-C [facility]

## 2023-08-22 ENCOUNTER — Encounter: Payer: Self-pay | Admitting: Physical Therapy

## 2023-08-22 ENCOUNTER — Ambulatory Visit: Payer: BC Managed Care – PPO | Attending: Physician Assistant | Admitting: Physical Therapy

## 2023-08-22 DIAGNOSIS — M5459 Other low back pain: Secondary | ICD-10-CM | POA: Insufficient documentation

## 2023-08-22 DIAGNOSIS — M5442 Lumbago with sciatica, left side: Secondary | ICD-10-CM | POA: Diagnosis not present

## 2023-08-22 NOTE — Therapy (Signed)
OUTPATIENT PHYSICAL THERAPY THORACOLUMBAR EVALUATION   Patient Name: Daniel Garner MRN: [id number] DOB:1955-11-10, 68 y.o., male Today's Date: 08/22/2023  END OF SESSION:  PT End of Session - 08/22/23 1000     Visit Number 1    Number of Visits 20    Date for PT Re-Evaluation 10/31/23    Authorization Type Medicare 2025    Authorization - Visit Number 1    Authorization - Number of Visits 20    Progress Note Due on Visit 10    PT Start Time 0900    PT Stop Time 0945    PT Time Calculation (min) 45 min    Activity Tolerance Patient tolerated treatment well    Behavior During Therapy [facility] for tasks assessed/performed             Past Medical History:  Diagnosis Date   Anxiety    Aortic atherosclerosis (HCC)    Arthritis    BPH with obstruction/lower urinary tract symptoms 06/15/2015   COVID 2020   Depression    Erectile dysfunction    a.) on PDE5i (sildenafil)   GERD (gastroesophageal reflux disease)    History of kidney stones    HLD (hyperlipidemia)    HTN (hypertension)    Migraines    Nephrolithiasis    OSA on CPAP    Prostatism 01/13/2015   Past Surgical History:  Procedure Laterality Date   COLONOSCOPY  07/26/1998   COLONOSCOPY WITH PROPOFOL N/A 08/06/2015   Procedure: COLONOSCOPY WITH PROPOFOL;  Surgeon: Kieth Brightly, MD;  Location: [facility] ENDOSCOPY;  Service: Endoscopy;  Laterality: N/A;   COLONOSCOPY WITH PROPOFOL N/A 09/02/2021   Procedure: COLONOSCOPY WITH PROPOFOL;  Surgeon: Wyline Mood, MD;  Location: [facility] ENDOSCOPY;  Service: Gastroenterology;  Laterality: N/A;   HERNIA REPAIR  [id number]   INSERTION OF MESH  09/03/2021   Procedure: INSERTION OF MESH;  Surgeon: Henrene Dodge, MD;  Location: [facility] ORS;  Service: General;;   POLYPECTOMY     TONSILLECTOMY     TOTAL HIP ARTHROPLASTY Right 12/27/2022   Procedure: TOTAL HIP ARTHROPLASTY;  Surgeon: Donato Heinz, MD;  Location: [facility] ORS;  Service: Orthopedics;  Laterality: Right;   UMBILICAL  HERNIA REPAIR N/A 09/03/2021   Procedure: HERNIA REPAIR UMBILICAL ADULT, incarcerated, open;  Surgeon: Henrene Dodge, MD;  Location: [facility] ORS;  Service: General;  Laterality: N/A;   Patient Active Problem List   Diagnosis Date Noted   Hx of total hip arthroplasty, right 12/27/2022   Vivid dream 03/17/2022   Mild episode of recurrent major depressive disorder (HCC) 03/17/2022   Atherosclerosis of aorta (HCC) 06/09/2021   Lower urinary tract symptoms (LUTS) 06/09/2021   Kidney stone on right side 06/09/2021   Constipation 04/06/2021   Osteoarthritis, hip, bilateral 05/27/2017   Dyslipidemia 08/22/2016   Chondromalacia of knee, 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   Sleep apnea 08/16/2016   History of migraine 08/16/2016   ED (erectile dysfunction) 08/16/2016   GERD without esophagitis 08/16/2016   Prostatism 01/13/2015    PCP: Dr. Alba Cory   REFERRING PROVIDER: Minerva Areola Mecum PA-C  REFERRING DIAG: M54.42 (ICD-10-CM) - Left-sided low back pain with left-sided sciatica, unspecified chronicity  Rationale for Evaluation and Treatment: Rehabilitation  THERAPY DIAG:  Other low back pain  ONSET DATE: 03/2023  SUBJECTIVE:  SUBJECTIVE STATEMENT: See pertinent history.  PERTINENT HISTORY:  Pt reports that he returned to work back in August 2024 after recovering from a right hip replacement.  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 He is still working at [employer] and he describes having to stock the shelves to. He describes feeling relief after using the Mobic  and when he straightens up he also feels better. Pt states that sitting also provides relief. Pt states that the pain radiated down the left hip to the lateral side of his calf. Pt reports also  having bilateral swelling in both his feet but especially the left foot.   PAIN:  Are you having pain? No   PRECAUTIONS: None  RED FLAGS: Bowel or bladder incontinence: Yes: unclear because pt describes feeling uncontrolled voiding     WEIGHT BEARING RESTRICTIONS: No  FALLS:  Has patient fallen in last 6 months? No  LIVING ENVIRONMENT: Lives with: lives with their spouse Lives in: House/apartment Stairs: Yes: Internal: 14 steps; on right going up and on left going up and External: 4-5 steps; on right going up Has following equipment at home: None  OCCUPATION: Driver for [employer]   PLOF: Independent  PATIENT GOALS: Patients wants to be able to regain his ability to walk similar distances without excessive pain.   NEXT MD VISIT: Not sure   OBJECTIVE:  Note: Objective measures were completed at Evaluation unless otherwise noted.  VITALS BP 153/92 HR 74 SpO2 100%  DIAGNOSTIC FINDINGS:  CLINICAL DATA:  Status post right hip arthroplasty   EXAM: DG HIP (WITH OR WITHOUT PELVIS) 2-3V RIGHT   COMPARISON:  CT abdomen and pelvis done on 05/14/2021   FINDINGS: There is interval right hip arthroplasty. Moderate to severe degenerative changes are noted in the left hip. There are surgical drains adjacent to the proximal right femur. Skin staples are noted in the lateral aspect of right hip.   IMPRESSION: Status post right hip arthroplasty. Marked degenerative changes are noted in the left hip.     Electronically Signed   By: Ernie Avena M.D.   On: 12/27/2022 12:16  PATIENT SURVEYS:  Modified Oswestry NT    COGNITION: Overall cognitive status: Within functional limits for tasks assessed     SENSATION: WFL  MUSCLE LENGTH: Hamstrings: Right 90 deg; Left 90 deg Thomas test: Not tested   POSTURE: No Significant postural limitations  PALPATION: No tenderness to palpation   LUMBAR ROM:   AROM eval  Flexion 100%  Extension 100%*  Right lateral  flexion 100%  Left lateral flexion 100%  Right rotation 100%  Left rotation 100%   (Blank rows = not tested)  LOWER EXTREMITY ROM:     Active  Right eval Left eval  Hip flexion    Hip extension    Hip abduction    Hip adduction    Hip internal rotation    Hip external rotation    Knee flexion    Knee extension    Ankle dorsiflexion 20 15  Ankle plantarflexion    Ankle inversion    Ankle eversion     (Blank rows = not tested)  LOWER EXTREMITY MMT:    MMT Right eval Left eval  Hip flexion 4+ 4+  Hip extension 4 4  Hip abduction 4+ 4+  Hip adduction 4- 4  Hip internal rotation    Hip external rotation    Knee  flexion 4+ 4+  Knee extension 4+ 4+  Ankle dorsiflexion 4+ 4  Ankle plantarflexion    Ankle inversion    Ankle eversion     (Blank rows = not tested)  LUMBAR SPECIAL TESTS:  Straight leg raise test: Negative, FABER test: Negative, and FADIR Negative   FUNCTIONAL TESTS:  6 minute walk test: NT  10 meter walk test: NT   10 Meter Walk Test   Gait Speed:    1st trial ______ sec , 2nd trial , Avg=   sec - Normal Gait speed Avg=   m/sec   Fast Gait Speed: 1st trial  sec, 2nd  sec, Avg=   sec -Fast Gait speed time= m/sec   -> 1 m/sec [employer] and cross street safely and normal WS >1 m/sec Need Intervention to reduce falls risk  0.12 m/sec improvement represents a statistically significant improvement in gait speed    - <0.4 m/sec - household walker - associated with increased frailty, risk of death, hospitalization or falls;  2-year mortality and morbidity; increased functional impairments, severe  walking disability  - <0.6 m/sec associated with severe impairment; likely that pt is capable only of limited community or household ambulation; increased likelihood of dependence with ADLs.  - 0.4 - 0.79 m/sec - limited community ambulator - associated with need for intervention to reduce falls; increased risk for LE limitation and death and  hospitalization in 1 year; increased dependence for personal care; 2-year mortality and morbidity; increased functional impairments, severe walking disability  - 0.8 - 1.3 m/sec - community ambulator - associated with increased independence in self-care   GAIT: Distance walked: 50 ft  Assistive device utilized: None Level of assistance: Complete Independence Comments: Left sided antalgic gait .   TREATMENT DATE:   08/22/23                                                                                                                              Prone quad stretch 2 x 30 sec  Knee to chest 2 x 30 sec  Lower trunk rotation 2 x 10    PATIENT EDUCATION:  Education details: Forma and technique for correct performance of exercise and explanation of deficits.  Person educated: Patient Education method: Explanation, Demonstration, Verbal cues, and Handouts Education comprehension: verbalized understanding, returned demonstration, and verbal cues required  HOME EXERCISE PROGRAM: Access Code: [email or web address] URL: [email or web address] Date: 08/22/2023 Prepared by: Ellin Goodie  Exercises - Supine Lower Trunk Rotation  - 1 x daily - 2 sets - 10 reps - 3 sec   hold - Supine Single Knee to Chest Stretch  - 1 x daily - 2 sets - 10 reps - 3 sec  hold - Prone Quadriceps Stretch with Strap  - 3-4 x weekly - 3 reps - 30 sec  hold  ASSESSMENT:  CLINICAL IMPRESSION: Patient is a 68 y.o. AA male who was seen today for physical therapy evaluation and treatment for left  sided low back pain. Despite referring diagnosis of lumber radiculopathy, lumbar radiculopathy was not fully ruled in with negative straight leg raise. However, pt's rating of pain is likely being affected by taking NSAID (Mobic) taken before the session and L5 myotomal weakness with decreased dorsiflexion on left side compared to the right side. He also exhibits deficits that include decreased LLE strength, left sided  antalgic gait, and increased pain with prolonged walking and standing. 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    OBJECTIVE IMPAIRMENTS: Abnormal gait, decreased mobility, difficulty walking, decreased ROM, decreased strength, impaired flexibility, and pain.   ACTIVITY LIMITATIONS: carrying, lifting, bending, standing, squatting, and stairs  PARTICIPATION LIMITATIONS: driving and occupation  PERSONAL FACTORS: Profession and 1-2 comorbidities: HTN and anxiety   are also affecting patient's functional outcome.   REHAB POTENTIAL: Good  CLINICAL DECISION MAKING: Stable/uncomplicated  EVALUATION COMPLEXITY: Low   GOALS: Goals reviewed with patient? No  SHORT TERM GOALS: Target date: 09/05/2023  Patient will demonstrate undestanding of home exercise plan by performing exercises correctly with evidence of good carry over with min to no verbal or tactile cues .   Baseline: NT  Goal status: INITIAL  2.  Patient will be able to perform without needing to take a rest break as evidence of improved lumbar function and decrease in radicular symptoms.  Baseline: NT  Goal status: INITIAL   LONG TERM GOALS: Target date: 10/31/2023  Patient will show a statistically significant improvement in his low back pain as evidenced by an improvement in her Oswestry Disability Index score of >=12.8 pts.   (Copay et al, 2008) Baseline: NT  Goal status: INITIAL  2.  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  Baseline: NT  Goal status: INITIAL  3.  Patient will be able to perform in >=1.0 m/sec as evidence of improved ambulator status and decreased risk for falling.  Baseline: NT  Goal status: INITIAL  4.   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  Baseline: Hip Ext R/L 4/4, Ankle DF R/L 4/4+, Abdominal NT  Goal status: INITIAL   PLAN:  PT FREQUENCY: 1-2x/week  PT DURATION: 10 weeks  PLANNED INTERVENTIONS: 97164- PT Re-evaluation, 97110-Therapeutic exercises, 97530- Therapeutic activity, 97112- Neuromuscular re-education, 97535- Self Care, 16109- Manual therapy, [id number]- Gait training, [phone]- Aquatic Therapy, 97014- Electrical stimulation (unattended), [phone]- Electrical stimulation (manual), Stair training, Taping, Dry Needling, Joint mobilization, Joint manipulation, Spinal manipulation, Spinal mobilization, DME instructions, Cryotherapy, and Moist heat.  PLAN FOR NEXT SESSION: Oswestry Disability Index, 10 mWT, , Test Hip IR and ER, Single Leg Stance Test, and weighted squats.    Ellin Goodie PT, DPT  [facility] [address] [place], [address] Phone: [phone]   Fax:  [phone]

## 2023-08-29 ENCOUNTER — Ambulatory Visit: Payer: Medicare Other | Admitting: Physical Therapy

## 2023-08-31 ENCOUNTER — Ambulatory Visit: Payer: Medicare Other | Attending: Physician Assistant | Admitting: Physical Therapy

## 2023-08-31 ENCOUNTER — Encounter: Payer: Self-pay | Admitting: Physical Therapy

## 2023-08-31 DIAGNOSIS — M5459 Other low back pain: Secondary | ICD-10-CM | POA: Insufficient documentation

## 2023-08-31 NOTE — Therapy (Signed)
 OUTPATIENT PHYSICAL THERAPY THORACOLUMBAR EVALUATION   Patient Name: Daniel Garner MRN: [id number] DOB:04-Jun-1956, 68 y.o., male Today's Date: 08/31/2023  END OF SESSION:  PT End of Session - 08/31/23 0951     Visit Number 2    Number of Visits 20    Date for PT Re-Evaluation 10/31/23    Authorization Type Medicare 2025    Authorization - Visit Number 2    Authorization - Number of Visits 20    Progress Note Due on Visit 10    PT Start Time [id number]    PT Stop Time 1030    PT Time Calculation (min) 43 min    Activity Tolerance Patient tolerated treatment well    Behavior During Therapy [facility] for tasks assessed/performed             Past Medical History:  Diagnosis Date   Anxiety    Aortic atherosclerosis (HCC)    Arthritis    BPH with obstruction/lower urinary tract symptoms 06/15/2015   COVID 2020   Depression    Erectile dysfunction    a.) on PDE5i (sildenafil )   GERD (gastroesophageal reflux disease)    History of kidney stones    HLD (hyperlipidemia)    HTN (hypertension)    Migraines    Nephrolithiasis    OSA on CPAP    Prostatism 01/13/2015   Past Surgical History:  Procedure Laterality Date   COLONOSCOPY  07/26/1998   COLONOSCOPY WITH PROPOFOL  N/A 08/06/2015   Procedure: COLONOSCOPY WITH PROPOFOL ;  Surgeon: Louanne KANDICE Muse, MD;  Location: [facility] ENDOSCOPY;  Service: Endoscopy;  Laterality: N/A;   COLONOSCOPY WITH PROPOFOL  N/A 09/02/2021   Procedure: COLONOSCOPY WITH PROPOFOL ;  Surgeon: Therisa Bi, MD;  Location: [facility] ENDOSCOPY;  Service: Gastroenterology;  Laterality: N/A;   HERNIA REPAIR  [phone]   INSERTION OF MESH  09/03/2021   Procedure: INSERTION OF MESH;  Surgeon: Desiderio Schanz, MD;  Location: [facility] ORS;  Service: General;;   POLYPECTOMY     TONSILLECTOMY     TOTAL HIP ARTHROPLASTY Right 12/27/2022   Procedure: TOTAL HIP ARTHROPLASTY;  Surgeon: Mardee Lynwood SQUIBB, MD;  Location: [facility] ORS;  Service: Orthopedics;  Laterality: Right;   UMBILICAL  HERNIA REPAIR N/A 09/03/2021   Procedure: HERNIA REPAIR UMBILICAL ADULT, incarcerated, open;  Surgeon: Desiderio Schanz, MD;  Location: [facility] ORS;  Service: General;  Laterality: N/A;   Patient Active Problem List   Diagnosis Date Noted   Hx of total hip arthroplasty, right 12/27/2022   Vivid dream 03/17/2022   Mild episode of recurrent major depressive disorder (HCC) 03/17/2022   Atherosclerosis of aorta (HCC) 06/09/2021   Lower urinary tract symptoms (LUTS) 06/09/2021   Kidney stone on right side 06/09/2021   Constipation 04/06/2021   Osteoarthritis, hip, bilateral 05/27/2017   Dyslipidemia 08/22/2016   Chondromalacia of knee, 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   Sleep apnea 08/16/2016   History of migraine 08/16/2016   ED (erectile dysfunction) 08/16/2016   GERD without esophagitis 08/16/2016   Prostatism 01/13/2015    PCP: Dr. Dorette Loron   REFERRING PROVIDER: Camellia Mecum PA-C  REFERRING DIAG: M54.42 (ICD-10-CM) - Left-sided low back pain with left-sided sciatica, unspecified chronicity  Rationale for Evaluation and Treatment: Rehabilitation  THERAPY DIAG:  Other low back pain  ONSET DATE: 03/2023  SUBJECTIVE:  SUBJECTIVE STATEMENT: Pt says he is feeling better today, did 2.5 miles on the elliptical without too much pain. Has a little pain on the left hip.   PERTINENT HISTORY:  Pt reports that he returned to work back in August 2024 after recovering from a right hip replacement.  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 He is still working at [employer] and he describes having to stock the shelves to. He describes feeling relief after using the Mobic   and when he straightens up he also feels better. Pt states that sitting also provides  relief. Pt states that the pain radiated down the left hip to the lateral side of his calf. Pt reports also having bilateral swelling in both his feet but especially the left foot.   PAIN:  Are you having pain? No   PRECAUTIONS: None  RED FLAGS: Bowel or bladder incontinence: Yes: unclear because pt describes feeling uncontrolled voiding     WEIGHT BEARING RESTRICTIONS: No  FALLS:  Has patient fallen in last 6 months? No  LIVING ENVIRONMENT: Lives with: lives with their spouse Lives in: House/apartment Stairs: Yes: Internal: 14 steps; on right going up and on left going up and External: 4-5 steps; on right going up Has following equipment at home: None  OCCUPATION: Driver for [employer]   PLOF: Independent  PATIENT GOALS: Patients wants to be able to regain his ability to walk similar distances without excessive pain.   NEXT MD VISIT: Not sure   OBJECTIVE:  Note: Objective measures were completed at Evaluation unless otherwise noted.  VITALS BP 153/92 HR 74 SpO2 100%  DIAGNOSTIC FINDINGS:  CLINICAL DATA:  Status post right hip arthroplasty   EXAM: DG HIP (WITH OR WITHOUT PELVIS) 2-3V RIGHT   COMPARISON:  CT abdomen and pelvis done on 05/14/2021   FINDINGS: There is interval right hip arthroplasty. Moderate to severe degenerative changes are noted in the left hip. There are surgical drains adjacent to the proximal right femur. Skin staples are noted in the lateral aspect of right hip.   IMPRESSION: Status post right hip arthroplasty. Marked degenerative changes are noted in the left hip.     Electronically Signed   By: Gearldine Mary M.D.   On: 12/27/2022 12:16  PATIENT SURVEYS:  ODI:   COGNITION: Overall cognitive status: Within functional limits for tasks assessed     SENSATION: WFL  MUSCLE LENGTH: Hamstrings: Right 90 deg; Left 90 deg Thomas test: Not tested   POSTURE: No Significant postural limitations  PALPATION: No tenderness to  palpation   LUMBAR ROM:   AROM eval  Flexion 100%  Extension 100%*  Right lateral flexion 100%  Left lateral flexion 100%  Right rotation 100%  Left rotation 100%   (Blank rows = not tested)  LOWER EXTREMITY ROM:     Active  Right eval Left eval  Hip flexion    Hip extension    Hip abduction    Hip adduction    Hip internal rotation    Hip external rotation    Knee flexion    Knee extension    Ankle dorsiflexion 20 15  Ankle plantarflexion    Ankle inversion    Ankle eversion     (Blank rows = not tested)  LOWER EXTREMITY MMT:    MMT Right eval Left eval  Hip flexion 4+ 4+  Hip extension 4 4  Hip abduction 4+  4+  Hip adduction 4- 4  Hip internal rotation 4+ 4+  Hip external rotation 4+ 4+  Knee flexion 4+ 4+  Knee extension 4+ 4+  Ankle dorsiflexion 4+ 4  Ankle plantarflexion    Ankle inversion    Ankle eversion     (Blank rows = not tested)  LUMBAR SPECIAL TESTS:  Straight leg raise test: Negative, FABER test: Negative, and FADIR Negative   FUNCTIONAL TESTS:  6 minute walk test: NT  10 meter walk test: NT   10 Meter Walk Test   Gait Speed:    1st trial ______ sec , 2nd trial , Avg=   sec - Normal Gait speed Avg=  1.11 m/sec  >1 m/sec Need Intervention to reduce falls risk  0.12 m/sec improvement represents a statistically significant improvement in gait speed    - 0.8 - 1.3 m/sec - community ambulator - associated with increased independence in self-care   GAIT: Distance walked: 50 ft  Assistive device utilized: None Level of assistance: Complete Independence Comments: Left sided antalgic gait .   TREATMENT DATE:   08/31/23  PHYSICAL PERFORMANCE  6 MWT: 1227 ft ODI: 12% Hip IR/ER MMT -Hip IR R/L 4+/4+ -Hip ER R/L 4+/4+  10 MWT: 1.11 m/sec  - first trial: 9.32 sec  - second trial: 8.63 sec Sahrmann core stability test: level 3   THEREX 90/90 with leg extensions above table 1x10 90/90 with leg extensions with heel taps  1x10 Nu step at 11 resistance 3 for 6 minutes  08/22/23                                                                                                                              Prone quad stretch 2 x 30 sec  Knee to chest 2 x 30 sec  Lower trunk rotation 2 x 10    PATIENT EDUCATION:  Education details: Forma and technique for correct performance of exercise and explanation of deficits.  Person educated: Patient Education method: Explanation, Demonstration, Verbal cues, and Handouts Education comprehension: verbalized understanding, returned demonstration, and verbal cues required  HOME EXERCISE PROGRAM: Access Code: [email or web address] URL: [email or web address] Date: 08/31/2023 Prepared by: Arleene Euler  Exercises - Supine Lower Trunk Rotation  - 1 x daily - 2 sets - 10 reps - 3 sec   hold - Supine Single Knee to Chest Stretch  - 1 x daily - 2 sets - 10 reps - 3 sec  hold - Prone Quadriceps Stretch with Strap  - 3-4 x weekly - 3 reps - 30 sec  hold - Supine 90/90 with Leg Extensions  - 1 x daily - 3-4 x weekly - 2 sets - 10 reps  ASSESSMENT:  CLINICAL IMPRESSION: Patient makes significant progress towards goals with decrease of back pain allowing for increase in activity. He was able to walk longer distances without any increase of pain or radicular symptoms which may be from taking NSAIDS prior to the session.  The pt's score on the ODI may be low from the use of the NSAIDs and may not be an accurate representation of improvement. Pt is currently ambulating at community level with both distance and walking speed. Pt shows decreased abdominal strength as shown by his level of the Sahrmann core stability test.  Pt will benefit from aerobic endurance training and abdominal strengthening to decrease low back pain without medication.   OBJECTIVE IMPAIRMENTS: Abnormal gait, decreased mobility, difficulty walking, decreased ROM, decreased strength, impaired flexibility, and pain.    ACTIVITY LIMITATIONS: carrying, lifting, bending, standing, squatting, and stairs  PARTICIPATION LIMITATIONS: driving and occupation  PERSONAL FACTORS: Profession and 1-2 comorbidities: HTN and anxiety   are also affecting patient's functional outcome.   REHAB POTENTIAL: Good  CLINICAL DECISION MAKING: Stable/uncomplicated  EVALUATION COMPLEXITY: Low   GOALS: Goals reviewed with patient? No  SHORT TERM GOALS: Target date: 09/05/2023  Patient will demonstrate undestanding of home exercise plan by performing exercises correctly with evidence of good carry over with min to no verbal or tactile cues .   Baseline: NT 08/31/23: pt able to complete independently  Goal status: ACHIEVED   2.  Patient will be able to perform without needing to take a rest break as evidence of improved lumbar function and decrease in radicular symptoms.  Baseline: pt able to perform without rest breaks  Goal status: ACHIEVED   LONG TERM GOALS: Target date: 10/31/2023  Patient will show a statistically significant improvement in his low back pain as evidenced by an improvement in his Oswestry Disability Index score of >=12.8 pts.   (Copay et al, 2008) Baseline: 12% Goal status: DEFERRED    2.  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  Baseline: 1227 ft Goal status: ACHIEVED   3.  Patient will be able to perform in >=1.0 m/sec as evidence of improved ambulator status and decreased risk for falling.  Baseline: 1.11 m/sec Goal status: ACHIEVED  4.  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  Baseline: Hip Ext R/L 4/4, Ankle DF R/L 4/4+,  Abdominal Level 3  Goal status: ONGOING    PLAN:  PT FREQUENCY: 1-2x/week  PT DURATION: 10 weeks  PLANNED INTERVENTIONS: 97164- PT Re-evaluation, 97110-Therapeutic exercises, 97530- Therapeutic activity, 97112- Neuromuscular re-education, 97535- Self Care, [id number]- Manual therapy, [id number]- Gait training, [phone]- Aquatic Therapy, 97014- Electrical stimulation (unattended), [phone]- Electrical stimulation (manual), Stair training, Taping, Dry Needling, Joint mobilization, Joint manipulation, Spinal manipulation, Spinal mobilization, DME instructions, Cryotherapy, and Moist heat.  PLAN FOR NEXT SESSION: Single leg stance, weighted squats. Leg press. Pallof Press.   Toribio Servant PT, DPT  [facility] [address], [place], [address] Phone: [phone]   Fax:  [phone]

## 2023-09-05 ENCOUNTER — Encounter: Payer: Self-pay | Admitting: Physical Therapy

## 2023-09-05 ENCOUNTER — Other Ambulatory Visit: Payer: Self-pay | Admitting: Physician Assistant

## 2023-09-05 ENCOUNTER — Ambulatory Visit: Payer: Medicare Other

## 2023-09-05 DIAGNOSIS — M5442 Lumbago with sciatica, left side: Secondary | ICD-10-CM

## 2023-09-05 DIAGNOSIS — M5459 Other low back pain: Secondary | ICD-10-CM | POA: Diagnosis not present

## 2023-09-05 NOTE — Therapy (Signed)
 OUTPATIENT PHYSICAL THERAPY THORACOLUMBAR EVALUATION   Patient Name: Daniel Garner MRN: [id number] DOB:Oct 13, 1955, 68 y.o., male Today's Date: 09/05/2023  END OF SESSION:  PT End of Session - 09/05/23 1117     Visit Number 3    Number of Visits 20    Date for PT Re-Evaluation 10/31/23    Authorization Type Medicare 2025    Authorization - Visit Number 3    Authorization - Number of Visits 20    Progress Note Due on Visit 10    PT Start Time 0905    PT Stop Time 0947    PT Time Calculation (min) 42 min    Activity Tolerance Patient tolerated treatment well    Behavior During Therapy [facility] for tasks assessed/performed              Past Medical History:  Diagnosis Date   Anxiety    Aortic atherosclerosis (HCC)    Arthritis    BPH with obstruction/lower urinary tract symptoms 06/15/2015   COVID 2020   Depression    Erectile dysfunction    a.) on PDE5i (sildenafil )   GERD (gastroesophageal reflux disease)    History of kidney stones    HLD (hyperlipidemia)    HTN (hypertension)    Migraines    Nephrolithiasis    OSA on CPAP    Prostatism 01/13/2015   Past Surgical History:  Procedure Laterality Date   COLONOSCOPY  07/26/1998   COLONOSCOPY WITH PROPOFOL  N/A 08/06/2015   Procedure: COLONOSCOPY WITH PROPOFOL ;  Surgeon: Jerlean Mood, MD;  Location: [facility] ENDOSCOPY;  Service: Endoscopy;  Laterality: N/A;   COLONOSCOPY WITH PROPOFOL  N/A 09/02/2021   Procedure: COLONOSCOPY WITH PROPOFOL ;  Surgeon: Luke Salaam, MD;  Location: [facility] ENDOSCOPY;  Service: Gastroenterology;  Laterality: N/A;   HERNIA REPAIR  [id number]   INSERTION OF MESH  09/03/2021   Procedure: INSERTION OF MESH;  Surgeon: Emmalene Hare, MD;  Location: [facility] ORS;  Service: General;;   POLYPECTOMY     TONSILLECTOMY     TOTAL HIP ARTHROPLASTY Right 12/27/2022   Procedure: TOTAL HIP ARTHROPLASTY;  Surgeon: Arlyne Lame, MD;  Location: [facility] ORS;  Service: Orthopedics;  Laterality: Right;   UMBILICAL  HERNIA REPAIR N/A 09/03/2021   Procedure: HERNIA REPAIR UMBILICAL ADULT, incarcerated, open;  Surgeon: Emmalene Hare, MD;  Location: [facility] ORS;  Service: General;  Laterality: N/A;   Patient Active Problem List   Diagnosis Date Noted   Hx of total hip arthroplasty, right 12/27/2022   Vivid dream 03/17/2022   Mild episode of recurrent major depressive disorder (HCC) 03/17/2022   Atherosclerosis of aorta (HCC) 06/09/2021   Lower urinary tract symptoms (LUTS) 06/09/2021   Kidney stone on right side 06/09/2021   Constipation 04/06/2021   Osteoarthritis, hip, bilateral 05/27/2017   Dyslipidemia 08/22/2016   Chondromalacia of knee, 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   Sleep apnea 08/16/2016   History of migraine 08/16/2016   ED (erectile dysfunction) 08/16/2016   GERD without esophagitis 08/16/2016   Prostatism 01/13/2015    PCP: Dr. Arleen Lacer   REFERRING PROVIDER: Lyell Samuel Mecum PA-C  REFERRING DIAG: M54.42 (ICD-10-CM) - Left-sided low back pain with left-sided sciatica, unspecified chronicity  Rationale for Evaluation and Treatment: Rehabilitation  THERAPY DIAG:  Other low back pain  ONSET DATE: 03/2023  SUBJECTIVE:  SUBJECTIVE STATEMENT: Pt says he is feeling a little pain on front of left leg coming in today, but it is manageable. Pt is going back to work tomorrow.   PERTINENT HISTORY:  Pt reports that he returned to work back in August 2024 after recovering from a right hip replacement.  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 He is still working at [employer] and he describes having to stock the shelves to. He describes feeling relief after using the Mobic   and when he straightens up he also feels better. Pt states that sitting also  provides relief. Pt states that the pain radiated down the left hip to the lateral side of his calf. Pt reports also having bilateral swelling in both his feet but especially the left foot.   PAIN:  Are you having pain? No   PRECAUTIONS: None  RED FLAGS: Bowel or bladder incontinence: Yes: unclear because pt describes feeling uncontrolled voiding     WEIGHT BEARING RESTRICTIONS: No  FALLS:  Has patient fallen in last 6 months? No  LIVING ENVIRONMENT: Lives with: lives with their spouse Lives in: House/apartment Stairs: Yes: Internal: 14 steps; on right going up and on left going up and External: 4-5 steps; on right going up Has following equipment at home: None  OCCUPATION: Driver for [employer]   PLOF: Independent  PATIENT GOALS: Patients wants to be able to regain his ability to walk similar distances without excessive pain.   NEXT MD VISIT: Not sure   OBJECTIVE:  Note: Objective measures were completed at Evaluation unless otherwise noted.  VITALS BP 153/92 HR 74 SpO2 100%  DIAGNOSTIC FINDINGS:  CLINICAL DATA:  Status post right hip arthroplasty   EXAM: DG HIP (WITH OR WITHOUT PELVIS) 2-3V RIGHT   COMPARISON:  CT abdomen and pelvis done on 05/14/2021   FINDINGS: There is interval right hip arthroplasty. Moderate to severe degenerative changes are noted in the left hip. There are surgical drains adjacent to the proximal right femur. Skin staples are noted in the lateral aspect of right hip.   IMPRESSION: Status post right hip arthroplasty. Marked degenerative changes are noted in the left hip.     Electronically Signed   By: Craven Do M.D.   On: 12/27/2022 12:16  PATIENT SURVEYS:  ODI: 12%   COGNITION: Overall cognitive status: Within functional limits for tasks assessed     SENSATION: WFL  MUSCLE LENGTH: Hamstrings: Right 90 deg; Left 90 deg Thomas test: Not tested   POSTURE: No Significant postural limitations  PALPATION: No  tenderness to palpation   LUMBAR ROM:   AROM eval  Flexion 100%  Extension 100%*  Right lateral flexion 100%  Left lateral flexion 100%  Right rotation 100%  Left rotation 100%   (Blank rows = not tested)  LOWER EXTREMITY ROM:     Active  Right eval Left eval  Hip flexion    Hip extension    Hip abduction    Hip adduction    Hip internal rotation    Hip external rotation    Knee flexion    Knee extension    Ankle dorsiflexion 20 15  Ankle plantarflexion    Ankle inversion    Ankle eversion     (Blank rows = not tested)  LOWER EXTREMITY MMT:    MMT Right eval Left eval  Hip flexion 4+ 4+  Hip extension 4 4  Hip abduction 4+ 4+  Hip adduction 4- 4  Hip internal rotation 4+ 4+  Hip external rotation 4+ 4+  Knee flexion 4+ 4+  Knee extension 4+ 4+  Ankle dorsiflexion 4+ 4  Ankle plantarflexion    Ankle inversion    Ankle eversion     (Blank rows = not tested)  LUMBAR SPECIAL TESTS:  Straight leg raise test: Negative, FABER test: Negative, and FADIR Negative   FUNCTIONAL TESTS:  6 minute walk test: NT  10 meter walk test: NT   10 Meter Walk Test   Gait Speed:    1st trial ______ sec , 2nd trial , Avg=   sec - Normal Gait speed Avg=  1.11 m/sec  >1 m/sec Need Intervention to reduce falls risk  0.12 m/sec improvement represents a statistically significant improvement in gait speed    - 0.8 - 1.3 m/sec - community ambulator - associated with increased independence in self-care   GAIT: Distance walked: 50 ft  Assistive device utilized: None Level of assistance: Complete Independence Comments: Left sided antalgic gait .   TREATMENT DATE:   09/05/23  TherAct:  NuStep seat at 10 with resistance 3 for 5 minutes  Mini squat 1x8    - pt had trouble with keeping chest up or keeping knees behind toes  - pt had increased pain in left knee  Sit to stand with no UE support 2x10  Bridges with ball between knees 3x10  Pallof press #15 1x10  -pt  reported increase in new hip pain  Pallof press #10 1x10  -pt reported increase in new hip pain  Pallof press #5 1x10   -pt reported less hip pain, but much less difficult      PATIENT EDUCATION:  Education details: [place] and technique for correct performance of exercise and explanation of deficits.  Person educated: Patient Education method: Explanation, Demonstration, Verbal cues, and Handouts Education comprehension: verbalized understanding, returned demonstration, and verbal cues required  HOME EXERCISE PROGRAM: Access Code: [email or web address] URL: [email or web address] Date: 09/05/2023 Prepared by: Andrena Ke  Exercises - Supine Lower Trunk Rotation  - 1 x daily - 2 sets - 10 reps - 3 sec   hold - Supine Single Knee to Chest Stretch  - 1 x daily - 2 sets - 10 reps - 3 sec  hold - Prone Quadriceps Stretch with Strap  - 3-4 x weekly - 3 reps - 30 sec  hold - Supine 90/90 with Leg Extensions  - 1 x daily - 3-4 x weekly - 2 sets - 10 reps - Supine Bridge with Mini Swiss Ball Between Knees  - 1 x daily - 3-4 x weekly - 3 sets - 10 reps - Sit to Stand Without Arm Support  - 1 x daily - 3-4 x weekly - 2 sets - 10 reps  ASSESSMENT:  CLINICAL IMPRESSION:  Patient shows difficulty with mini squats with increased weight shift to the left side. Pt also has increased hip and knee pain which worsened when cued to keep chest up and knees behind toes. Pt may benefit from further squat training to ensure safe bending technique for work. Pt appears to have leg length discrepancy with increased length in his right femur compared to the left during squats and bridges, but showed no difference with anatomical measurement. Pt has increased hip pain during pallof press which may be from lack of core strength to offload the hips to remain neutral. Pt will benefit from aerobic endurance training and abdominal  strengthening to decrease low back pain without medication.   OBJECTIVE  IMPAIRMENTS: Abnormal gait, decreased mobility, difficulty walking, decreased ROM, decreased strength, impaired flexibility, and pain.   ACTIVITY LIMITATIONS: carrying, lifting, bending, standing, squatting, and stairs  PARTICIPATION LIMITATIONS: driving and occupation  PERSONAL FACTORS: Profession and 1-2 comorbidities: HTN and anxiety   are also affecting patient's functional outcome.   REHAB POTENTIAL: Good  CLINICAL DECISION MAKING: Stable/uncomplicated  EVALUATION COMPLEXITY: Low   GOALS: Goals reviewed with patient? No  SHORT TERM GOALS: Target date: 09/05/2023  Patient will demonstrate undestanding of home exercise plan by performing exercises correctly with evidence of good carry over with min to no verbal or tactile cues .   Baseline: NT 08/31/23: pt able to complete independently  Goal status: ACHIEVED   2.  Patient will be able to perform without needing to take a rest break as evidence of improved lumbar function and decrease in radicular symptoms.  Baseline: pt able to perform without rest breaks  Goal status: ACHIEVED   LONG TERM GOALS: Target date: 10/31/2023  Patient will show a statistically significant improvement in his low back pain as evidenced by an improvement in his Oswestry Disability Index score of >=12.8 pts.   (Copay et al, 2008) Baseline: 12% Goal status: DEFERRED    2.  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  Baseline: 1227 ft Goal status: ACHIEVED   3.  Patient will be able to perform in >=1.0 m/sec as evidence of improved ambulator status and decreased risk for falling.  Baseline: 1.11 m/sec Goal status: ACHIEVED  4.  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  Baseline: Hip Ext R/L 4/4, Ankle DF R/L 4/4+, Abdominal Level 3  Goal status: ONGOING    PLAN:  PT FREQUENCY: 1-2x/week  PT DURATION: 10 weeks  PLANNED INTERVENTIONS: 97164- PT Re-evaluation, 97110-Therapeutic exercises, 97530- Therapeutic activity, 97112- Neuromuscular re-education, 97535- Self Care, [id number]- Manual therapy, [id number]- Gait training, [phone]- Aquatic Therapy, 97014- Electrical stimulation (unattended), [phone]- Electrical stimulation (manual), Stair training, Taping, Dry Needling, Joint mobilization, Joint manipulation, Spinal manipulation, Spinal mobilization, DME instructions, Cryotherapy, and Moist heat.  PLAN FOR NEXT SESSION: Leg press. Single leg stance. Squat with UE support.    [employer], SPT [employer] DPT   Rozanna Corner, PT, DPT Physical Therapist - [facility]  09/05/23, 12:23 PM

## 2023-09-06 NOTE — Telephone Encounter (Signed)
Requested Prescriptions  Pending Prescriptions Disp Refills   meloxicam (MOBIC) 7.5 MG tablet [Pharmacy Med Name: MELOXICAM 7.5 MG TABLET] 30 tablet 0    Sig: TAKE 1 TABLET BY MOUTH EVERY DAY     Analgesics:  COX2 Inhibitors Failed - 09/06/2023  8:29 AM      Failed - Manual Review: Labs are only required if the patient has taken medication for more than 8 weeks.      Passed - HGB in normal range and within 360 days    Hemoglobin  Date Value Ref Range Status  12/16/2022 14.9 13.0 - 17.0 g/dL Final         Passed - Cr in normal range and within 360 days    Creat  Date Value Ref Range Status  11/16/2022 1.05 0.70 - 1.35 mg/dL Final   Creatinine, Ser  Date Value Ref Range Status  12/16/2022 1.00 0.61 - 1.24 mg/dL Final         Passed - HCT in normal range and within 360 days    HCT  Date Value Ref Range Status  12/16/2022 46.0 39.0 - 52.0 % Final         Passed - AST in normal range and within 360 days    AST  Date Value Ref Range Status  12/16/2022 23 15 - 41 U/L Final         Passed - ALT in normal range and within 360 days    ALT  Date Value Ref Range Status  12/16/2022 20 0 - 44 U/L Final         Passed - eGFR is 30 or above and within 360 days    GFR, Est African American  Date Value Ref Range Status  10/03/2019 96 > OR = 60 mL/min/1.66m2 Final   GFR, Est Non African American  Date Value Ref Range Status  10/03/2019 83 > OR = 60 mL/min/1.60m2 Final   GFR, Estimated  Date Value Ref Range Status  12/16/2022 >60 >60 mL/min Final    Comment:    (NOTE) Calculated using the CKD-EPI Creatinine Equation (2021)    eGFR  Date Value Ref Range Status  11/16/2022 79 > OR = 60 mL/min/1.31m2 Final         Passed - Patient is not pregnant      Passed - Valid encounter within last 12 months    Recent Outpatient Visits           3 weeks ago Left-sided low back pain with left-sided sciatica, unspecified chronicity   [place] [facility] Mecum,  Oswaldo Conroy, PA-C   9 months ago Well adult exam   [facility] Alba Cory, MD   1 year ago Vivid dream   [facility] Alba Cory, MD   1 year ago Mild episode of recurrent major depressive disorder [facility])   [place] [facility] Danelle Berry, PA-C   1 year ago Moderate episode of recurrent major depressive disorder [facility])   [facility] Alba Cory, MD

## 2023-09-12 ENCOUNTER — Ambulatory Visit: Payer: Medicare Other | Admitting: Physical Therapy

## 2023-09-12 DIAGNOSIS — M5459 Other low back pain: Secondary | ICD-10-CM | POA: Diagnosis not present

## 2023-09-12 NOTE — Therapy (Addendum)
 OUTPATIENT PHYSICAL THERAPY THORACOLUMBAR DISCHARGE    Patient Name: Daniel Garner MRN: [id number] DOB:October 13, 1955, 68 y.o., male Today's Date: 09/12/2023  END OF SESSION:  PT End of Session - 09/12/23 0913     Visit Number 4    Number of Visits 20    Date for PT Re-Evaluation 10/31/23    Authorization Type Medicare 2025    Authorization - Visit Number 4    Authorization - Number of Visits 20    Progress Note Due on Visit 10    PT Start Time 0910    PT Stop Time 0950    PT Time Calculation (min) 40 min    Activity Tolerance Patient tolerated treatment well    Behavior During Therapy [facility] for tasks assessed/performed              Past Medical History:  Diagnosis Date   Anxiety    Aortic atherosclerosis (HCC)    Arthritis    BPH with obstruction/lower urinary tract symptoms 06/15/2015   COVID 2020   Depression    Erectile dysfunction    a.) on PDE5i (sildenafil)   GERD (gastroesophageal reflux disease)    History of kidney stones    HLD (hyperlipidemia)    HTN (hypertension)    Migraines    Nephrolithiasis    OSA on CPAP    Prostatism 01/13/2015   Past Surgical History:  Procedure Laterality Date   COLONOSCOPY  07/26/1998   COLONOSCOPY WITH PROPOFOL N/A 08/06/2015   Procedure: COLONOSCOPY WITH PROPOFOL;  Surgeon: Kieth Brightly, MD;  Location: [facility] ENDOSCOPY;  Service: Endoscopy;  Laterality: N/A;   COLONOSCOPY WITH PROPOFOL N/A 09/02/2021   Procedure: COLONOSCOPY WITH PROPOFOL;  Surgeon: Wyline Mood, MD;  Location: [facility] ENDOSCOPY;  Service: Gastroenterology;  Laterality: N/A;   HERNIA REPAIR  [id number]   INSERTION OF MESH  09/03/2021   Procedure: INSERTION OF MESH;  Surgeon: Henrene Dodge, MD;  Location: [facility] ORS;  Service: General;;   POLYPECTOMY     TONSILLECTOMY     TOTAL HIP ARTHROPLASTY Right 12/27/2022   Procedure: TOTAL HIP ARTHROPLASTY;  Surgeon: Donato Heinz, MD;  Location: [facility] ORS;  Service: Orthopedics;  Laterality: Right;   UMBILICAL  HERNIA REPAIR N/A 09/03/2021   Procedure: HERNIA REPAIR UMBILICAL ADULT, incarcerated, open;  Surgeon: Henrene Dodge, MD;  Location: [facility] ORS;  Service: General;  Laterality: N/A;   Patient Active Problem List   Diagnosis Date Noted   Hx of total hip arthroplasty, right 12/27/2022   Vivid dream 03/17/2022   Mild episode of recurrent major depressive disorder (HCC) 03/17/2022   Atherosclerosis of aorta (HCC) 06/09/2021   Lower urinary tract symptoms (LUTS) 06/09/2021   Kidney stone on right side 06/09/2021   Constipation 04/06/2021   Osteoarthritis, hip, bilateral 05/27/2017   Dyslipidemia 08/22/2016   Chondromalacia of knee, 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   Sleep apnea 08/16/2016   History of migraine 08/16/2016   ED (erectile dysfunction) 08/16/2016   GERD without esophagitis 08/16/2016   Prostatism 01/13/2015    PCP: Dr. Alba Cory   REFERRING PROVIDER: Minerva Areola Mecum PA-C  REFERRING DIAG: M54.42 (ICD-10-CM) - Left-sided low back pain with left-sided sciatica, unspecified chronicity  Rationale for Evaluation and Treatment: Rehabilitation  THERAPY DIAG:  Other low back pain  ONSET DATE: 03/2023  SUBJECTIVE:  SUBJECTIVE STATEMENT: Pt reports that he continues to experience some lateral left hip pain. The pain no longer spreads down to his left knee and remains localized to his left lateral hip. He reports that the pain tends to increase towards the end of the work day, when walking faster, or towards end of the work week. He continues to take medications and this has helped him the most in terms of dealing with the symptoms.     PERTINENT HISTORY:  Pt reports that he returned to work back in August 2024 after recovering from a right hip replacement.  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 He is still working at [employer] and he describes having to stock the shelves to. He describes feeling relief after using the Mobic  and when he straightens up he also feels better. Pt states that sitting also provides relief. Pt states that the pain radiated down the left hip to the lateral side of his calf. Pt reports also having bilateral swelling in both his feet but especially the left foot.   PAIN:  Are you having pain? No   PRECAUTIONS: None  RED FLAGS: Bowel or bladder incontinence: Yes: unclear because pt describes feeling uncontrolled voiding     WEIGHT BEARING RESTRICTIONS: No  FALLS:  Has patient fallen in last 6 months? No  LIVING ENVIRONMENT: Lives with: lives with their spouse Lives in: House/apartment Stairs: Yes: Internal: 14 steps; on right going up and on left going up and External: 4-5 steps; on right going up Has following equipment at home: None  OCCUPATION: Driver for [employer]   PLOF: Independent  PATIENT GOALS: Patients wants to be able to regain his ability to walk similar distances without excessive pain.   NEXT MD VISIT: Not sure   OBJECTIVE:  Note: Objective measures were completed at Evaluation unless otherwise noted.  VITALS BP 153/92 HR 74 SpO2 100%  DIAGNOSTIC FINDINGS:  CLINICAL DATA:  Status post right hip arthroplasty   EXAM: DG HIP (WITH OR WITHOUT PELVIS) 2-3V RIGHT   COMPARISON:  CT abdomen and pelvis done on 05/14/2021   FINDINGS: There is interval right hip arthroplasty. Moderate to severe degenerative changes are noted in the left hip. There are surgical drains adjacent to the proximal right femur. Skin staples are noted in the lateral aspect of right hip.   IMPRESSION: Status post right hip arthroplasty. Marked degenerative changes are noted in the left hip.     Electronically Signed   By: Ernie Avena M.D.   On: 12/27/2022 12:16  PATIENT SURVEYS:  ODI:  12%   COGNITION: Overall cognitive status: Within functional limits for tasks assessed     SENSATION: WFL  MUSCLE LENGTH: Hamstrings: Right 90 deg; Left 90 deg Thomas test: Not tested   POSTURE: No Significant postural limitations  PALPATION: No tenderness to palpation   LUMBAR ROM:   AROM eval  Flexion 100%  Extension 100%*  Right lateral flexion 100%  Left lateral flexion 100%  Right rotation 100%  Left rotation 100%   (Blank rows = not tested)  LOWER EXTREMITY ROM:     Active  Right eval Left eval  Hip flexion    Hip extension    Hip abduction    Hip adduction    Hip internal rotation    Hip external rotation    Knee flexion    Knee extension  Ankle dorsiflexion 20 15  Ankle plantarflexion    Ankle inversion    Ankle eversion     (Blank rows = not tested)  LOWER EXTREMITY MMT:    MMT Right eval Left eval  Hip flexion 4+ 4+  Hip extension 4 4  Hip abduction 4+ 4+  Hip adduction 4- 4  Hip internal rotation 4+ 4+  Hip external rotation 4+ 4+  Knee flexion 4+ 4+  Knee extension 4+ 4+  Ankle dorsiflexion 4+ 4  Ankle plantarflexion    Ankle inversion    Ankle eversion     (Blank rows = not tested)  LUMBAR SPECIAL TESTS:  Straight leg raise test: Negative, FABER test: Negative, and FADIR Negative   FUNCTIONAL TESTS:  6 minute walk test: NT  10 meter walk test: NT   10 Meter Walk Test   Gait Speed:    1st trial ______ sec , 2nd trial , Avg=   sec - Normal Gait speed Avg=  1.11 m/sec  >1 m/sec Need Intervention to reduce falls risk  0.12 m/sec improvement represents a statistically significant improvement in gait speed    - 0.8 - 1.3 m/sec - community ambulator - associated with increased independence in self-care   GAIT: Distance walked: 50 ft  Assistive device utilized: None Level of assistance: Complete Independence Comments: Left sided antalgic gait .   TREATMENT DATE:   09/12/23:  TherAct:  NuStep seat at 10 with  resistance 3 for 3 minutes  -Pt reports increased pain in lateral left hip.  Sit to Stand from 20 inch mat height 2 x 10    THEREX  FADIR: Positive on LLE, but pain experienced on  Side Lying IT band stretch on LLE 1 x 30 sec  Standing IT band stretch on LLE 2 x 30 sec  Leg Press #85 1 x 5  -Pt reports increased lateral left hip pain   09/05/23  TherAct:  NuStep seat at 10 with resistance 3 for 5 minutes  Mini squat 1x8    - pt had trouble with keeping chest up or keeping knees behind toes  - pt had increased pain in left knee  Sit to stand with no UE support 2x10  Bridges with ball between knees 3x10  Pallof press #15 1x10  -pt reported increase in new hip pain  Pallof press #10 1x10  -pt reported increase in new hip pain  Pallof press #5 1x10   -pt reported less hip pain, but much less difficult      PATIENT EDUCATION:  Education details: Mar[facility]e Staff and technique for correct performance of exercise and explanation of deficits.  Person educated: Patient Education method: Explanation, Demonstration, Verbal cues, and Handouts Education comprehension: verbalized understanding, returned demonstration, and verbal cues required  HOME EXERCISE PROGRAM: Access Code: [email or web address] URL: [email or web address] Date: 09/12/2023 Prepared by: Ellin Goodie  Exercises - Supine Lower Trunk Rotation  - 1 x daily - 2 sets - 10 reps - 3 sec   hold - Supine Single Knee to Chest Stretch  - 1 x daily - 2 sets - 10 reps - 3 sec  hold - Prone Quadriceps Stretch with Strap  - 3-4 x weekly - 3 reps - 30 sec  hold - Supine 90/90 with Leg Extensions  - 1 x daily - 3-4 x weekly - 2 sets - 10 reps - Sit to Stand Without Arm Support  - 1 x daily - 3-4 x weekly - 3 sets - 10 reps  ASSESSMENT:  CLINICAL IMPRESSION: Pt has now decided to discharge from physical therapy due to improvement in pain response. He will continue to perform home exercise plan to maintain functional gains.    Pt  shows pain limitations during session with increased pain in the left lateral hip. Lateral hip pain likely more a result of ongoing lumbar radiculopathy with FADIR eliciting pain but on lateral side of hip and not groin. He also experienced an increase in left sided low back pain with straight leg raise. Pt does experience an increase in his pain with increased left hip flexion as evidenced by inability to complete leg press and and Nu-step. PT encouraged pt to take active rest periods especially during long work days. He will benefit from aerobic endurance training and abdominal strengthening to decrease low back pain without medication.    OBJECTIVE IMPAIRMENTS: Abnormal gait, decreased mobility, difficulty walking, decreased ROM, decreased strength, impaired flexibility, and pain.   ACTIVITY LIMITATIONS: carrying, lifting, bending, standing, squatting, and stairs  PARTICIPATION LIMITATIONS: driving and occupation  PERSONAL FACTORS: Profession and 1-2 comorbidities: HTN and anxiety   are also affecting patient's functional outcome.   REHAB POTENTIAL: Good  CLINICAL DECISION MAKING: Stable/uncomplicated  EVALUATION COMPLEXITY: Low   GOALS: Goals reviewed with patient? No  SHORT TERM GOALS: Target date: 09/05/2023  Patient will demonstrate undestanding of home exercise plan by performing exercises correctly with evidence of good carry over with min to no verbal or tactile cues .   Baseline: NT 08/31/23: pt able to complete independently  Goal status: ACHIEVED   2.  Patient will be able to perform without needing to take a rest break as evidence of improved lumbar function and decrease in radicular symptoms.  Baseline: pt able to perform without rest breaks  Goal status: ACHIEVED   LONG TERM GOALS: Target date: 10/31/2023  Patient will show a statistically significant improvement in his low back pain as evidenced by an improvement in his Oswestry Disability Index score of >=12.8 pts.    (Copay et al, 2008) Baseline: 12% Goal status: DEFERRED    2.  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  Baseline: 1227 ft Goal status: ACHIEVED   3.  Patient will be able to perform in >=1.0 m/sec as evidence of improved ambulator status and decreased risk for falling.  Baseline: 1.11 m/sec Goal status: ACHIEVED  4.  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  Baseline: Hip Ext R/L 4/4, Ankle DF R/L 4/4+, Abdominal Level 3  Goal status: ONGOING    PLAN:  PT FREQUENCY: 1-2x/week  PT DURATION: 10 weeks  PLANNED INTERVENTIONS: 97164- PT Re-evaluation, 97110-Therapeutic exercises, 97530- Therapeutic activity, 97112- Neuromuscular re-education, 97535- Self Care, 16109- Manual therapy, [id number]- Gait training, [phone]- Aquatic Therapy, 97014- Electrical stimulation (unattended), [phone]- Electrical stimulation (manual), Stair training, Taping, Dry Needling, Joint mobilization, Joint manipulation, Spinal manipulation, Spinal mobilization, DME instructions, Cryotherapy, and Moist heat.  PLAN FOR NEXT SESSION: Discharge from PT   Ellin Goodie PT, DPT  [facility] [address], [place], [address] Phone: [phone]   Fax:  [phone]

## 2023-09-19 ENCOUNTER — Ambulatory Visit: Payer: Medicare Other | Admitting: Physical Therapy

## 2023-09-26 ENCOUNTER — Ambulatory Visit: Payer: Medicare Other | Admitting: Physical Therapy

## 2023-10-03 ENCOUNTER — Encounter: Payer: 59 | Admitting: Physical Therapy

## 2023-10-04 ENCOUNTER — Other Ambulatory Visit: Payer: Self-pay | Admitting: Family Medicine

## 2023-10-04 DIAGNOSIS — M5442 Lumbago with sciatica, left side: Secondary | ICD-10-CM

## 2023-10-10 ENCOUNTER — Encounter: Payer: 59 | Admitting: Physical Therapy

## 2023-10-13 ENCOUNTER — Other Ambulatory Visit: Payer: Self-pay | Admitting: Family Medicine

## 2023-10-13 ENCOUNTER — Ambulatory Visit: Payer: Self-pay | Admitting: Family Medicine

## 2023-10-13 ENCOUNTER — Encounter: Payer: Self-pay | Admitting: Family Medicine

## 2023-10-13 DIAGNOSIS — M5442 Lumbago with sciatica, left side: Secondary | ICD-10-CM

## 2023-10-13 MED ORDER — MELOXICAM 15 MG PO TABS: 15.0000 mg | ORAL_TABLET | Freq: Every day | ORAL | 0 refills | Status: DC

## 2023-10-13 NOTE — Telephone Encounter (Signed)
 NT has made 3 attempts to call the patient without success. Unable to leave voicemail. Routing to clinic.     Copied from CRM [id number]. Topic: Clinical - Medication Question >> Oct 13, 2023  9:47 AM Fuller Mandril wrote: Reason for CRM: Patient called wanted to make appt to see provider for Tuesday on his day off. Symptoms not improving - knee and hip pain. Would like to know if last medication that was prescribed could be increases. Did not know that name but states its 7.5 mg. Can reach out to patient with any questions or concerns. Thank You

## 2023-10-13 NOTE — Telephone Encounter (Signed)
 You want him to follow up?

## 2023-10-13 NOTE — Telephone Encounter (Signed)
 Pt only has 2 pills left if you can send enough till pt sees Dr.Andrews.

## 2023-10-13 NOTE — Telephone Encounter (Signed)
 Pt notified- verbalized understanding.

## 2023-10-13 NOTE — Telephone Encounter (Signed)
 Call Could not be completed as dialed.    Copied from CRM [id number]. Topic: Clinical - Medication Question >> Oct 13, 2023  9:47 AM Fuller Mandril wrote: Reason for CRM: Patient called wanted to make appt to see provider for Tuesday on his day off. Symptoms not improving - knee and hip pain. Would like to know if last medication that was prescribed could be increases. Did not know that name but states its 7.5 mg. Can reach out to patient with any questions or concerns. Thank You

## 2023-10-17 ENCOUNTER — Encounter: Payer: 59 | Admitting: Physical Therapy

## 2023-10-18 ENCOUNTER — Ambulatory Visit: Admitting: Internal Medicine

## 2023-10-18 ENCOUNTER — Encounter: Payer: Self-pay | Admitting: Internal Medicine

## 2023-10-18 ENCOUNTER — Other Ambulatory Visit: Payer: Self-pay

## 2023-10-18 ENCOUNTER — Ambulatory Visit
Admission: RE | Admit: 2023-10-18 | Discharge: 2023-10-18 | Disposition: A | Discharge status: Home / Self Care | Source: Ambulatory Visit | Attending: Internal Medicine | Admitting: Internal Medicine

## 2023-10-18 ENCOUNTER — Ambulatory Visit
Admission: RE | Admit: 2023-10-18 | Discharge: 2023-10-18 | Disposition: A | Discharge status: Home / Self Care | Attending: Internal Medicine | Admitting: Internal Medicine

## 2023-10-18 VITALS — BP 132/84 | HR 82 | Temp 97.9°F | Resp 16 | Ht 70.0 in | Wt 190.3 lb

## 2023-10-18 DIAGNOSIS — M25552 Pain in left hip: Secondary | ICD-10-CM | POA: Diagnosis not present

## 2023-10-18 DIAGNOSIS — M1612 Unilateral primary osteoarthritis, left hip: Secondary | ICD-10-CM | POA: Diagnosis not present

## 2023-10-18 DIAGNOSIS — N528 Other male erectile dysfunction: Secondary | ICD-10-CM

## 2023-10-18 DIAGNOSIS — M9140 Coxa magna, unspecified hip: Secondary | ICD-10-CM | POA: Diagnosis not present

## 2023-10-18 DIAGNOSIS — Z471 Aftercare following joint replacement surgery: Secondary | ICD-10-CM | POA: Diagnosis not present

## 2023-10-18 DIAGNOSIS — Z96641 Presence of right artificial hip joint: Secondary | ICD-10-CM | POA: Diagnosis not present

## 2023-10-18 MED ORDER — SILDENAFIL CITRATE 50 MG PO TABS: 50.0000 mg | ORAL_TABLET | Freq: Every day | ORAL | 0 refills | Status: DC | PRN

## 2023-10-18 NOTE — Progress Notes (Signed)
   Acute Office Visit  Subjective:     Patient ID: Daniel Garner, male    DOB: 09-18-1955, 68 y.o.   MRN: [id number]  Chief Complaint  Patient presents with   Hip Pain    Left     Hip Pain    Patient is in today for left hip pain. He has a history of right THA back in June which he did really well with. 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 Pain is located on the right greater trochanter radiating to the groin. Worse with walking and weight bearing, no pain while sitting. Taking Celebrex left over from his surgery which does help.   Review of Systems  Constitutional:  Negative for chills and fever.  Musculoskeletal:  Positive for joint pain.        Objective:    BP 132/84 (Cuff Size: Large)   Pulse 82   Temp 97.9 F (36.6 C) (Oral)   Resp 16   Ht 5\' 10"  (1.778 m)   Wt 190 lb 4.8 oz (86.3 kg)   SpO2 97%   BMI 27.31 kg/m    Physical Exam Constitutional:      Appearance: Normal appearance.  HENT:     Head: Normocephalic and atraumatic.  Eyes:     Conjunctiva/sclera: Conjunctivae normal.  Cardiovascular:     Rate and Rhythm: Normal rate and regular rhythm.  Pulmonary:     Effort: Pulmonary effort is normal.     Breath sounds: Normal breath sounds.  Musculoskeletal:        General: Tenderness present. No swelling.     Left hip: Tenderness and bony tenderness present. Decreased range of motion.     Comments: Significantly reduced internal and external hip rotation, pain to palpation over greater trochanter on the right  Skin:    General: Skin is warm and dry.  Neurological:     General: No focal deficit present.     Mental Status: He is alert. Mental status is at baseline.  Psychiatric:        Mood and Affect: Mood normal.        Behavior: Behavior normal.     No results found for any visits on 10/18/23.      Assessment & Plan:   1. Left hip pain  (Primary): Consistent with arthritis, will send for x-ray to assess severity. Continue Celebrex BID as needed, take with food. Consider steroid injection after x-ray results.   - DG Hip Unilat W OR W/O Pelvis 2-3 Views Left; Future  2. Other male erectile dysfunction: Refills today.   - sildenafil (VIAGRA) 50 MG tablet; Take 1 tablet (50 mg total) by mouth daily as needed for erectile dysfunction.  Dispense: 90 tablet; Refill: 0   Return if symptoms worsen or fail to improve.  Margarita Mail, DO

## 2023-10-18 NOTE — Patient Instructions (Addendum)
 It was great seeing you today!  Plan discussed at today's visit: -Continue Celebrex twice a day as needed with food -X-ray of hip today  Follow up in: as needed   Take care and let us know if you have any questions or concerns prior to your next visit.  Dr. Caralee Ates

## 2023-10-20 ENCOUNTER — Encounter: Payer: Self-pay | Admitting: Internal Medicine

## 2023-10-20 NOTE — Addendum Note (Signed)
 Addended by: Margarita Mail on: 10/20/2023 07:53 AM   Modules accepted: Orders

## 2023-10-24 ENCOUNTER — Encounter: Payer: 59 | Admitting: Physical Therapy

## 2023-10-31 ENCOUNTER — Encounter: Payer: 59 | Admitting: Physical Therapy

## 2023-11-07 ENCOUNTER — Encounter: Payer: 59 | Admitting: Physical Therapy

## 2023-11-11 IMAGING — US US SCROTUM W/ DOPPLER COMPLETE
1 series · 14 of 25 positions shown · non-contrast
Comparison: CT abdomen pelvis dated 05/14/2021.

CLINICAL DATA: Left testicular pain.

EXAM:
SCROTAL ULTRASOUND
DOPPLER ULTRASOUND OF THE TESTICLES
TECHNIQUE: Complete ultrasound examination of the testicles, epididymis, and
other scrotal structures was performed. Color and spectral Doppler
ultrasound were also utilized to evaluate blood flow to the
testicles.

[Series 1: us scrotum w/ doppler complete · 0.07mm/px · 14 of 72 slices shown]
[im 1/72]
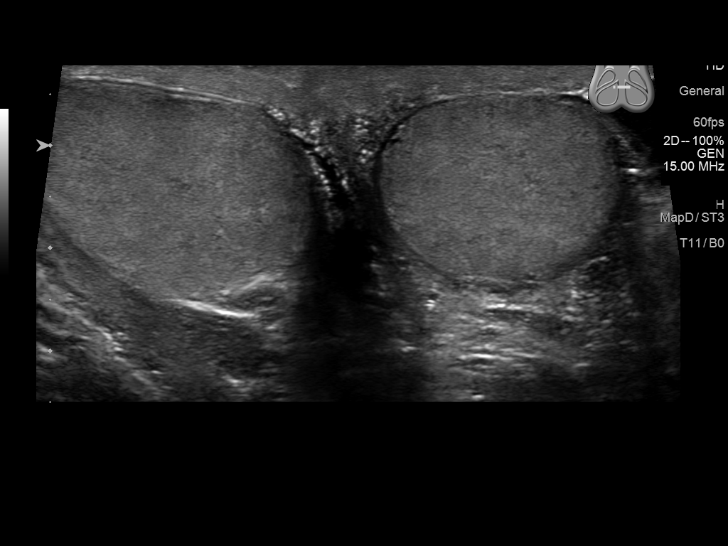
[im 6/72]
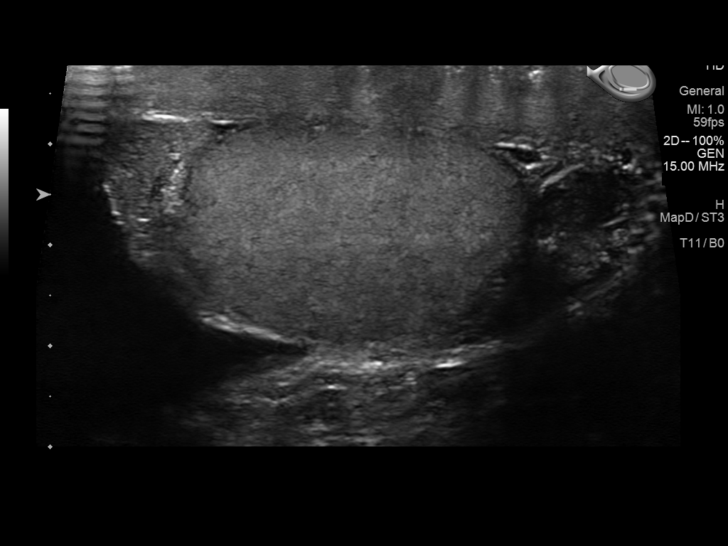
[im 12/72]
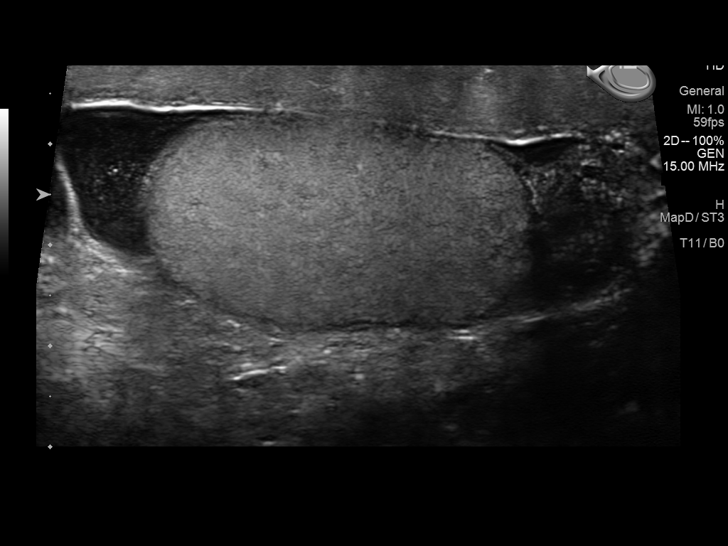
[im 18/72]
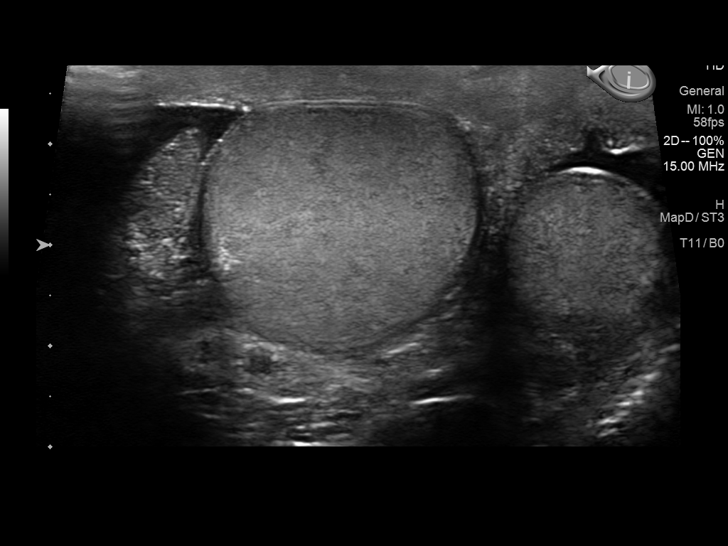
[im 24/72]
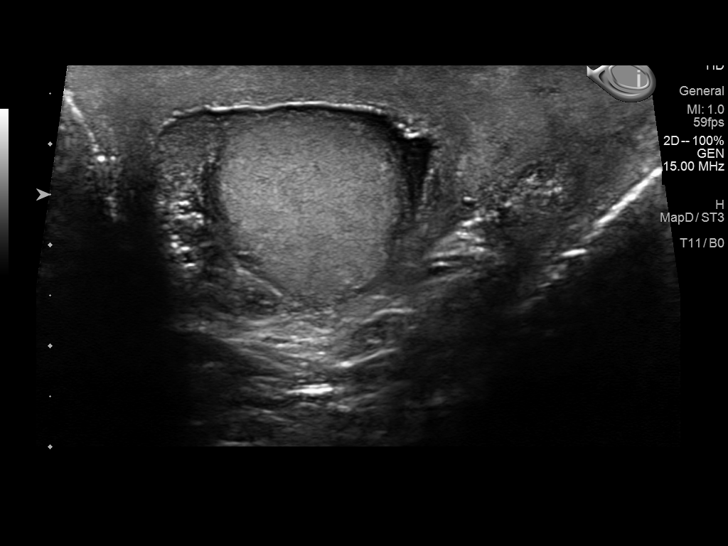
[im 27/72]
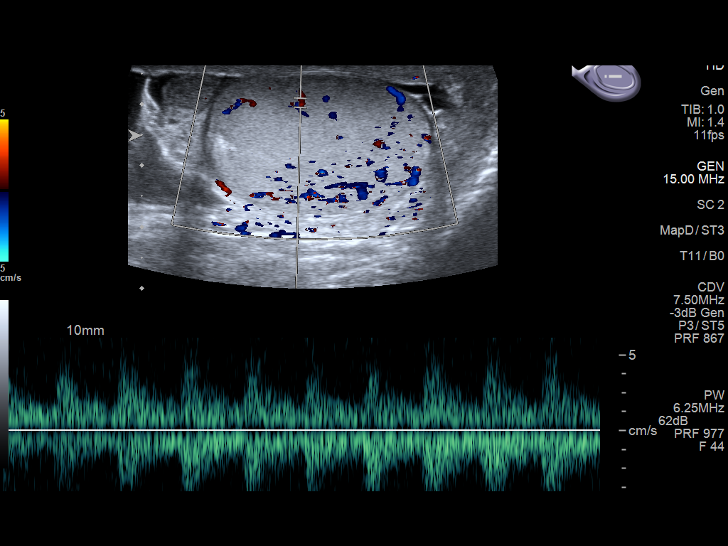
[im 33/72]
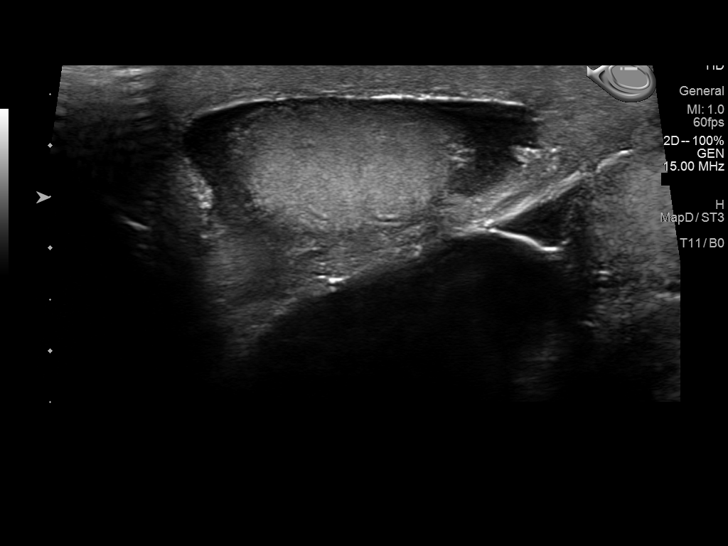
[im 39/72]
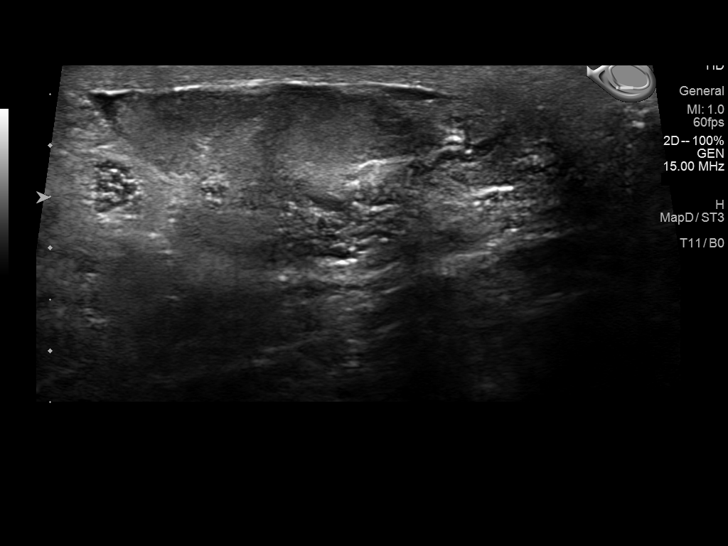
[im 45/72]
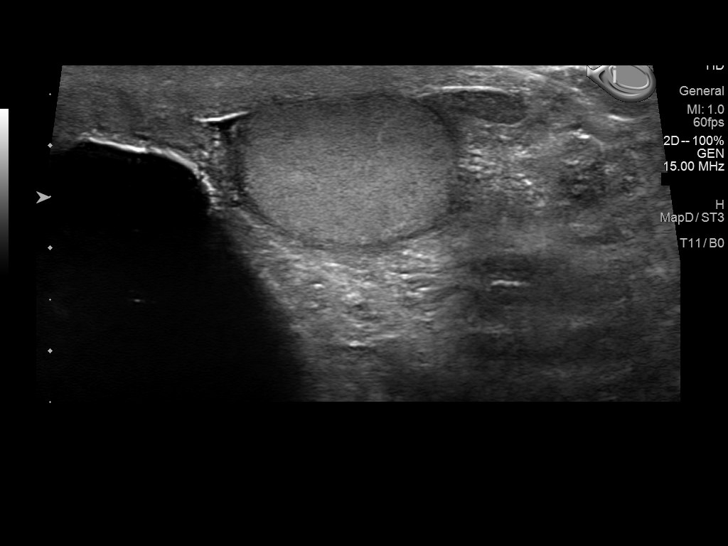
[im 48/72]
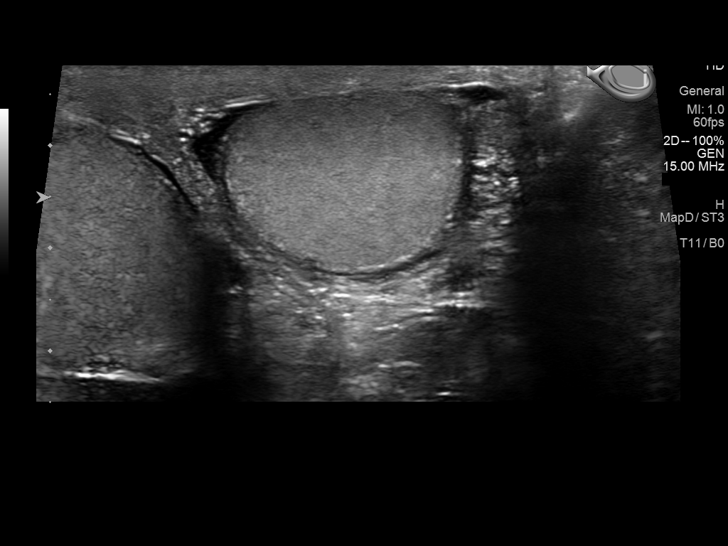
[im 54/72]
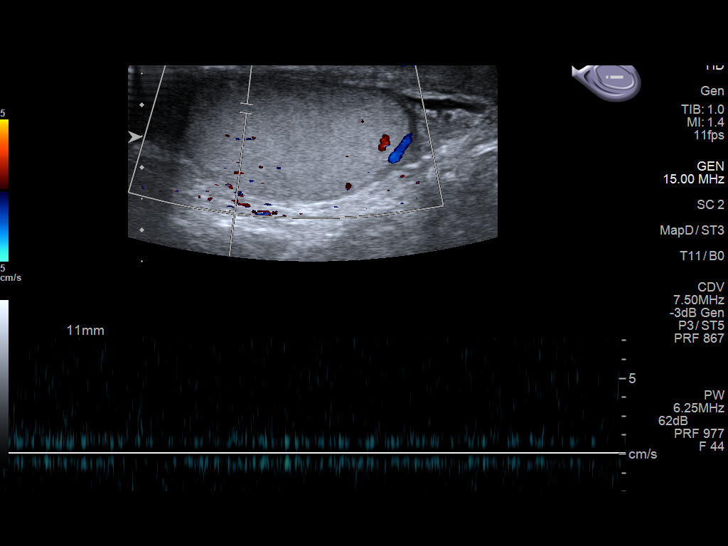
[im 60/72]
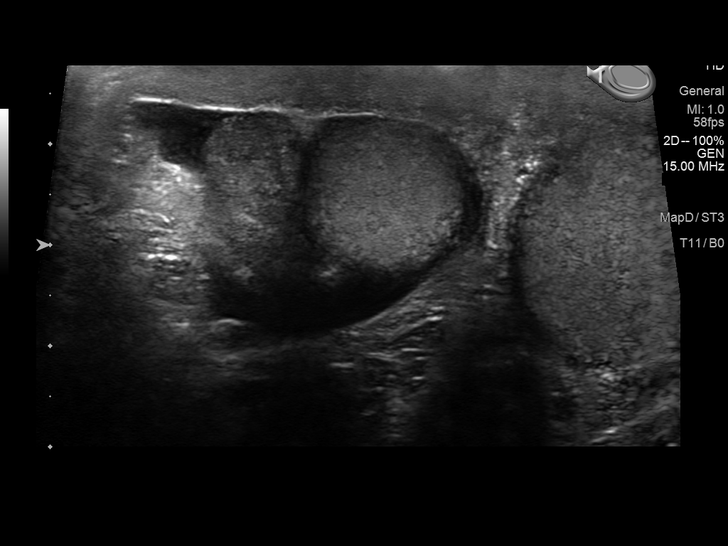
[im 66/72]
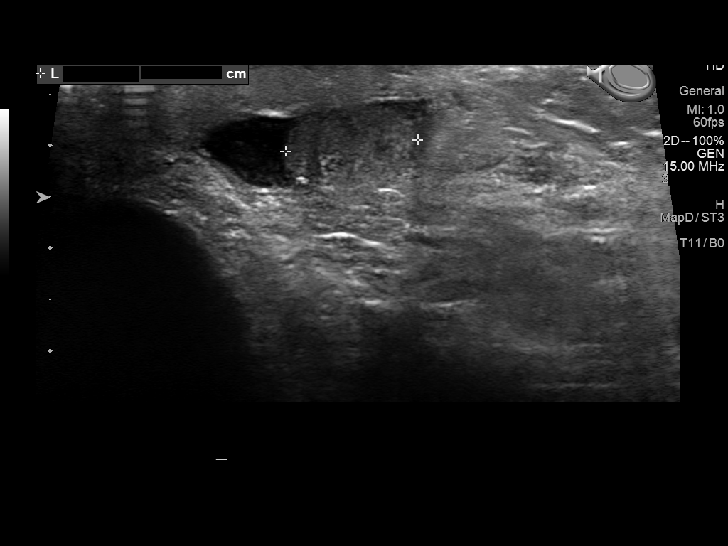
[im 72/72]
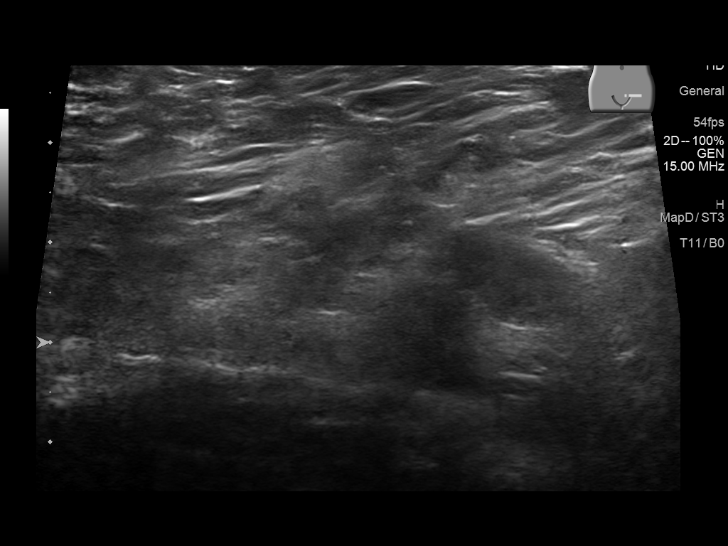

[14 of 25 positions shown; findings below may reference images not displayed]

FINDINGS: Right testicle

Measurements: 3.9 x 2.5 x 2.7 cm. No mass or microlithiasis
visualized.

Left testicle

Measurements: 3.7 x 1.9 x 2.7 cm. No mass or microlithiasis
visualized.

Right epididymis:  Normal in size and appearance.

Left epididymis:  Normal in size and appearance.

Hydrocele: Small bilateral hydroceles, right greater than left with
low-level echogenic debris.

Varicocele:  None visualized.

Pulsed Doppler interrogation of both testes demonstrates normal low
resistance arterial and venous waveforms bilaterally.

There is 5.8 x 1.8 cm pocket of fluid in the right inguinal canal.
IMPRESSION: 1. Unremarkable testicles.
2. Small minimally complex bilateral hydroceles, right greater left.
3. Loculated fluid in the right inguinal canal.

## 2023-11-14 ENCOUNTER — Encounter: Payer: 59 | Admitting: Physical Therapy

## 2023-11-21 ENCOUNTER — Encounter: Payer: 59 | Admitting: Physical Therapy

## 2023-11-30 DIAGNOSIS — M25552 Pain in left hip: Secondary | ICD-10-CM | POA: Diagnosis not present

## 2023-12-06 DIAGNOSIS — M1612 Unilateral primary osteoarthritis, left hip: Secondary | ICD-10-CM | POA: Diagnosis not present

## 2023-12-20 DIAGNOSIS — M1612 Unilateral primary osteoarthritis, left hip: Secondary | ICD-10-CM | POA: Diagnosis not present

## 2023-12-21 ENCOUNTER — Encounter: Payer: Self-pay | Admitting: Family Medicine

## 2023-12-21 ENCOUNTER — Ambulatory Visit: Admitting: Family Medicine

## 2023-12-21 VITALS — BP 122/70 | HR 85 | Resp 16 | Ht 70.0 in | Wt 186.8 lb

## 2023-12-21 DIAGNOSIS — Z125 Encounter for screening for malignant neoplasm of prostate: Secondary | ICD-10-CM | POA: Diagnosis not present

## 2023-12-21 DIAGNOSIS — Z01818 Encounter for other preprocedural examination: Secondary | ICD-10-CM | POA: Diagnosis not present

## 2023-12-21 DIAGNOSIS — R7303 Prediabetes: Secondary | ICD-10-CM

## 2023-12-21 DIAGNOSIS — I700 Atherosclerosis of aorta: Secondary | ICD-10-CM

## 2023-12-21 DIAGNOSIS — R399 Unspecified symptoms and signs involving the genitourinary system: Secondary | ICD-10-CM | POA: Diagnosis not present

## 2023-12-21 DIAGNOSIS — F325 Major depressive disorder, single episode, in full remission: Secondary | ICD-10-CM

## 2023-12-21 DIAGNOSIS — M1612 Unilateral primary osteoarthritis, left hip: Secondary | ICD-10-CM

## 2023-12-21 NOTE — Progress Notes (Signed)
 Name: Daniel Garner   MRN: [id number]    DOB: [birth year]   Date:12/21/2023       Progress Note  Subjective  Chief Complaint  Chief Complaint  Patient presents with   Pre-op Exam    Total Joint replacement L side   Discussed the use of AI scribe software for clinical note transcription with the patient, who gave verbal consent to proceed.  History of Present Illness Daniel Garner is a 68 year old male with osteoarthritis of the left hip who presents for a follow-up and preoperative evaluation.  He experiences significant pain in his left hip due to osteoarthritis, rating it as 'ten plus' when working. 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 He has been working since August of last year and finds the pain impacts his ability to function effectively at work. He wants to return to a pain-free state to continue working.  He underwent a right hip replacement last summer and reports no pain or issues with the right hip post-surgery. He completed physical therapy successfully.  He has a history of recurrent major depressive disorder, which is currently in remission. He feels 'hundred thousand percent healed' and does not experience any depression or anxiety symptoms at present.  He has symptoms of prostate enlargement, with a slightly elevated PSA last year. He occasionally gets up at night to urinate, but this is manageable by adjusting his fluid intake before bed.  His last A1c was 5.7, indicating prediabetes. No significant hunger, thirst, or increased urination.  He has atherosclerosis of the aorta, identified through a previous CT scan of the abdomen. He has not been on cholesterol medication. He and his wife do not consume much fried food at home, and he prefers grilled or raw fish when dining out.    Patient Active Problem List   Diagnosis Date Noted   Hx of total hip arthroplasty, right 12/27/2022    Vivid dream 03/17/2022   Mild episode of recurrent major depressive disorder (HCC) 03/17/2022   Atherosclerosis of aorta (HCC) 06/09/2021   Lower urinary tract symptoms (LUTS) 06/09/2021   Kidney stone on right side 06/09/2021   Constipation 04/06/2021   Osteoarthritis, hip, bilateral 05/27/2017   Dyslipidemia 08/22/2016   Chondromalacia of knee, 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   Sleep apnea 08/16/2016   History of migraine 08/16/2016   ED (erectile dysfunction) 08/16/2016   GERD without esophagitis 08/16/2016   Prostatism 01/13/2015    Past Surgical History:  Procedure Laterality Date   COLONOSCOPY  07/26/1998   COLONOSCOPY WITH PROPOFOL  N/A 08/06/2015   Procedure: COLONOSCOPY WITH PROPOFOL ;  Surgeon: Jerlean Mood, MD;  Location: [facility] ENDOSCOPY;  Service: Endoscopy;  Laterality: N/A;   COLONOSCOPY WITH PROPOFOL  N/A 09/02/2021   Procedure: COLONOSCOPY WITH PROPOFOL ;  Surgeon: Luke Salaam, MD;  Location: [facility] ENDOSCOPY;  Service: Gastroenterology;  Laterality: N/A;   HERNIA REPAIR  [id number]   INSERTION OF MESH  09/03/2021   Procedure: INSERTION OF MESH;  Surgeon: Emmalene Hare, MD;  Location: [facility] ORS;  Service: General;;   POLYPECTOMY     TONSILLECTOMY     TOTAL HIP ARTHROPLASTY Right 12/27/2022   Procedure: TOTAL HIP ARTHROPLASTY;  Surgeon: Arlyne Lame, MD;  Location: [facility] ORS;  Service: Orthopedics;  Laterality: Right;   UMBILICAL HERNIA REPAIR N/A 09/03/2021   Procedure: HERNIA REPAIR UMBILICAL ADULT, incarcerated, open;  Surgeon: Emmalene Hare, MD;  Location: [facility] ORS;  Service: General;  Laterality: N/A;  Family History  Problem Relation Age of Onset   Arthritis Mother    Colon cancer Father    Prostate cancer Father    Lung cancer Father     Social History   Tobacco Use   Smoking status: Never   Smokeless tobacco: Never  Substance Use Topics   Alcohol use: Not Currently    Comment: > 38 yrs     Current Outpatient Medications:    Ascorbic Acid (VITAMIN C  PO), Take 1 tablet by mouth daily. With D3 and Zinc , Disp: , Rfl:    celecoxib  (CELEBREX ) 200 MG capsule, Take 200 mg by mouth 2 (two) times daily., Disp: , Rfl:    Cyanocobalamin  (B-12 PO), Take 1 capsule by mouth daily., Disp: , Rfl:    sildenafil  (VIAGRA ) 50 MG tablet, Take 1 tablet (50 mg total) by mouth daily as needed for erectile dysfunction., Disp: 90 tablet, Rfl: 0   VITAMIN D  PO, Take 1 capsule by mouth daily., Disp: , Rfl:    VITAMIN E PO, Take 1-2 capsules by mouth daily., Disp: , Rfl:   No Known Allergies  I personally reviewed active problem list, medication list, allergies with the patient/caregiver today.   ROS  Ten systems reviewed and is negative except as mentioned in HPI    Objective Physical Exam Constitutional: Patient appears well-developed and well-nourished.  No distress.  HEENT: head atraumatic, normocephalic, pupils equal and reactive to light, neck supple Cardiovascular: Normal rate, regular rhythm and normal heart sounds.  No murmur heard. No BLE edema. Pulmonary/Chest: Effort normal and breath sounds normal. No respiratory distress. Abdominal: Soft.  There is no tenderness. Muscular skeletal: pain with rom of left hip  Psychiatric: Patient has a normal mood and affect. behavior is normal. Judgment and thought content normal.     Vitals:   12/21/23 1117  BP: 122/70  Pulse: 85  Resp: 16  SpO2: 100%  Weight: 186 lb 12.8 oz (84.7 kg)  Height: 5\' 10"  (1.778 m)    Body mass index is 26.8 kg/m.   PHQ2/9:    12/21/2023   11:16 AM 10/18/2023    2:44 PM 08/15/2023    1:01 PM 11/16/2022   10:24 AM 03/17/2022    1:45 PM  Depression screen PHQ 2/9  Decreased Interest 0 0 0 0 2  Down, Depressed, Hopeless 0 0 0 0 2  PHQ - 2 Score 0 0 0 0 4  Altered sleeping   0 0 3  Tired, decreased energy   0 0 2  Change in appetite   0 0 0  Feeling bad or failure about yourself    0 0 1  Trouble concentrating   0 0 0  Moving slowly or fidgety/restless   0 0 0   Suicidal thoughts   0 0 0  PHQ-9 Score   0 0 10    phq 9 is negative  Fall Risk:    12/21/2023   11:16 AM 10/18/2023    2:44 PM 11/16/2022   10:24 AM 03/17/2022    1:40 PM 02/02/2022    1:21 PM  Fall Risk   Falls in the past year? 0 0 0 0 0  Number falls in past yr: 0 0 0 0 0  Injury with Fall? 0 0 0 0 0  Risk for fall due to : No Fall Risks No Fall Risks No Fall Risks No Fall Risks No Fall Risks  Follow up Falls prevention discussed;Education provided;Falls evaluation completed Falls evaluation  completed Falls prevention discussed Falls prevention discussed Falls prevention discussed;Education provided      Assessment & Plan Osteoarthritis of left hip Chronic osteoarthritis with severe pain causing functional impairment. Discussed surgical intervention to improve function. - Order CBC and comprehensive metabolic panel for preoperative evaluation. - Proceed with preoperative evaluation for left hip replacement surgery. - Discussed risks and benefits of surgery, including potential complications and expected improvement in function.  Atherosclerosis of aorta Atherosclerosis with plaque buildup. Emphasized cholesterol management to reduce cardiovascular risk. Discussed medication concerns and lifestyle changes. - Recommend starting cholesterol-lowering medication but patient refused at this time - Advise on lifestyle modifications: plant-based diet, increased physical activity, reduce fried and processed foods.  Benign prostatic hyperplasia Benign prostatic hyperplasia with slightly elevated PSA. Occasional nocturia managed with fluid intake adjustments. - Order repeat PSA test.  Prediabetes Prediabetes with previous A1c of 5.7%. - Order repeat A1c test.

## 2023-12-22 ENCOUNTER — Ambulatory Visit: Payer: Self-pay | Admitting: Family Medicine

## 2023-12-22 LAB — LIPID PANEL
Cholesterol: 159 mg/dL (ref <200)
HDL: 61 mg/dL (ref >=40)
LDL Cholesterol (Calc): 84 mg/dL
Non-HDL Cholesterol (Calc): 98 mg/dL (ref <130)
Total CHOL/HDL Ratio: 2.6 (calc) (ref <5.0)
Triglycerides: 62 mg/dL (ref <150)

## 2023-12-22 LAB — COMPREHENSIVE METABOLIC PANEL WITH GFR
AG Ratio: 2 (calc) (ref 1.0–2.5)
ALT: 23 U/L (ref 9–46)
AST: 18 U/L (ref 10–35)
Albumin: 4.3 g/dL (ref 3.6–5.1)
Alkaline phosphatase (APISO): 78 U/L (ref 35–144)
BUN: 12 mg/dL (ref 7–25)
CO2: 29 mmol/L (ref 20–32)
Calcium: 9.6 mg/dL (ref 8.6–10.3)
Chloride: 103 mmol/L (ref 98–110)
Creat: 0.93 mg/dL (ref 0.70–1.35)
Globulin: 2.2 g/dL (ref 1.9–3.7)
Glucose, Bld: 82 mg/dL (ref 65–99)
Potassium: 5.1 mmol/L (ref 3.5–5.3)
Sodium: 138 mmol/L (ref 135–146)
Total Bilirubin: 0.4 mg/dL (ref 0.2–1.2)
Total Protein: 6.5 g/dL (ref 6.1–8.1)
eGFR: 91 mL/min/{1.73_m2} (ref >=60)

## 2023-12-22 LAB — CBC WITH DIFFERENTIAL/PLATELET
Absolute Lymphocytes: 2319 {cells}/uL (ref 850–3900)
Absolute Monocytes: 374 {cells}/uL (ref 200–950)
Basophils Absolute: 31 {cells}/uL (ref 0–200)
Basophils Relative: 0.6 %
Eosinophils Absolute: 83 {cells}/uL (ref 15–500)
Eosinophils Relative: 1.6 %
HCT: 40.9 % (ref 38.5–50.0)
Hemoglobin: 13.4 g/dL (ref 13.2–17.1)
MCH: 31.3 pg (ref 27.0–33.0)
MCHC: 32.8 g/dL (ref 32.0–36.0)
MCV: 95.6 fL (ref 80.0–100.0)
MPV: 10.4 fL (ref 7.5–12.5)
Monocytes Relative: 7.2 %
Neutro Abs: 2392 {cells}/uL (ref 1500–7800)
Neutrophils Relative %: 46 %
Platelets: 262 10*3/uL (ref 140–400)
RBC: 4.28 10*6/uL (ref 4.20–5.80)
RDW: 13.4 % (ref 11.0–15.0)
Total Lymphocyte: 44.6 %
WBC: 5.2 10*3/uL (ref 3.8–10.8)

## 2023-12-22 LAB — HEMOGLOBIN A1C
Hgb A1c MFr Bld: 5.6 % (ref <5.7)
Mean Plasma Glucose: 114 mg/dL
eAG (mmol/L): 6.3 mmol/L

## 2023-12-22 LAB — PSA: PSA: 3.07 ng/mL (ref <=4.00)

## 2023-12-26 ENCOUNTER — Ambulatory Visit: Admit: 2023-12-26 | Admitting: Orthopedic Surgery

## 2023-12-26 SURGERY — ARTHROPLASTY, HIP, TOTAL,POSTERIOR APPROACH
Anesthesia: Choice | Site: Hip | Laterality: Left

## 2023-12-27 NOTE — Telephone Encounter (Signed)
 Copied from CRM [id number]. Topic: General - Other >> Dec 27, 2023 11:59 AM Elle L wrote: Reason for CRM: The patient states he dropped off a surgical clearance form at his appointment on 5/28 for [facility] and he is requesting an update on this as they have not received it. The patient's call back number is [phone].

## 2024-01-12 DIAGNOSIS — E559 Vitamin D deficiency, unspecified: Secondary | ICD-10-CM | POA: Diagnosis not present

## 2024-01-12 DIAGNOSIS — M1612 Unilateral primary osteoarthritis, left hip: Secondary | ICD-10-CM | POA: Diagnosis not present

## 2024-01-12 DIAGNOSIS — Z5181 Encounter for therapeutic drug level monitoring: Secondary | ICD-10-CM | POA: Diagnosis not present

## 2024-01-12 DIAGNOSIS — Z7901 Long term (current) use of anticoagulants: Secondary | ICD-10-CM | POA: Diagnosis not present

## 2024-01-12 DIAGNOSIS — Z01818 Encounter for other preprocedural examination: Secondary | ICD-10-CM | POA: Diagnosis not present

## 2024-01-12 DIAGNOSIS — M25552 Pain in left hip: Secondary | ICD-10-CM | POA: Diagnosis not present

## 2024-01-13 DIAGNOSIS — Z01810 Encounter for preprocedural cardiovascular examination: Secondary | ICD-10-CM | POA: Diagnosis not present

## 2024-02-01 DIAGNOSIS — Z79899 Other long term (current) drug therapy: Secondary | ICD-10-CM | POA: Diagnosis not present

## 2024-02-01 DIAGNOSIS — R2689 Other abnormalities of gait and mobility: Secondary | ICD-10-CM | POA: Diagnosis not present

## 2024-02-01 DIAGNOSIS — J439 Emphysema, unspecified: Secondary | ICD-10-CM | POA: Diagnosis not present

## 2024-02-01 DIAGNOSIS — Z79891 Long term (current) use of opiate analgesic: Secondary | ICD-10-CM | POA: Diagnosis not present

## 2024-02-01 DIAGNOSIS — Z96642 Presence of left artificial hip joint: Secondary | ICD-10-CM | POA: Diagnosis not present

## 2024-02-01 DIAGNOSIS — Z791 Long term (current) use of non-steroidal anti-inflammatories (NSAID): Secondary | ICD-10-CM | POA: Diagnosis not present

## 2024-02-01 DIAGNOSIS — G4730 Sleep apnea, unspecified: Secondary | ICD-10-CM | POA: Diagnosis not present

## 2024-02-01 DIAGNOSIS — R262 Difficulty in walking, not elsewhere classified: Secondary | ICD-10-CM | POA: Diagnosis not present

## 2024-02-01 DIAGNOSIS — Z881 Allergy status to other antibiotic agents status: Secondary | ICD-10-CM | POA: Diagnosis not present

## 2024-02-01 DIAGNOSIS — F419 Anxiety disorder, unspecified: Secondary | ICD-10-CM | POA: Diagnosis not present

## 2024-02-01 DIAGNOSIS — F32A Depression, unspecified: Secondary | ICD-10-CM | POA: Diagnosis not present

## 2024-02-01 DIAGNOSIS — M1612 Unilateral primary osteoarthritis, left hip: Secondary | ICD-10-CM | POA: Diagnosis not present

## 2024-02-01 DIAGNOSIS — Z96643 Presence of artificial hip joint, bilateral: Secondary | ICD-10-CM | POA: Diagnosis not present

## 2024-02-01 HISTORY — PX: TOTAL HIP ARTHROPLASTY: SHX124

## 2024-02-02 DIAGNOSIS — M1612 Unilateral primary osteoarthritis, left hip: Secondary | ICD-10-CM | POA: Diagnosis not present

## 2024-02-02 DIAGNOSIS — Z79899 Other long term (current) drug therapy: Secondary | ICD-10-CM | POA: Diagnosis not present

## 2024-02-02 DIAGNOSIS — Z79891 Long term (current) use of opiate analgesic: Secondary | ICD-10-CM | POA: Diagnosis not present

## 2024-02-02 DIAGNOSIS — R2689 Other abnormalities of gait and mobility: Secondary | ICD-10-CM | POA: Diagnosis not present

## 2024-02-02 DIAGNOSIS — R262 Difficulty in walking, not elsewhere classified: Secondary | ICD-10-CM | POA: Diagnosis not present

## 2024-02-02 DIAGNOSIS — F419 Anxiety disorder, unspecified: Secondary | ICD-10-CM | POA: Diagnosis not present

## 2024-02-02 DIAGNOSIS — Z791 Long term (current) use of non-steroidal anti-inflammatories (NSAID): Secondary | ICD-10-CM | POA: Diagnosis not present

## 2024-02-02 DIAGNOSIS — Z881 Allergy status to other antibiotic agents status: Secondary | ICD-10-CM | POA: Diagnosis not present

## 2024-02-02 DIAGNOSIS — F32A Depression, unspecified: Secondary | ICD-10-CM | POA: Diagnosis not present

## 2024-02-02 DIAGNOSIS — G4730 Sleep apnea, unspecified: Secondary | ICD-10-CM | POA: Diagnosis not present

## 2024-02-03 DIAGNOSIS — M1612 Unilateral primary osteoarthritis, left hip: Secondary | ICD-10-CM | POA: Diagnosis not present

## 2024-02-03 DIAGNOSIS — R339 Retention of urine, unspecified: Secondary | ICD-10-CM | POA: Diagnosis not present

## 2024-02-03 DIAGNOSIS — R1030 Lower abdominal pain, unspecified: Secondary | ICD-10-CM | POA: Diagnosis not present

## 2024-02-03 DIAGNOSIS — Z96642 Presence of left artificial hip joint: Secondary | ICD-10-CM | POA: Diagnosis not present

## 2024-02-06 DIAGNOSIS — Z96642 Presence of left artificial hip joint: Secondary | ICD-10-CM | POA: Diagnosis not present

## 2024-02-06 DIAGNOSIS — Z7982 Long term (current) use of aspirin: Secondary | ICD-10-CM | POA: Diagnosis not present

## 2024-02-06 DIAGNOSIS — F419 Anxiety disorder, unspecified: Secondary | ICD-10-CM | POA: Diagnosis not present

## 2024-02-06 DIAGNOSIS — Z471 Aftercare following joint replacement surgery: Secondary | ICD-10-CM | POA: Diagnosis not present

## 2024-02-06 DIAGNOSIS — F32A Depression, unspecified: Secondary | ICD-10-CM | POA: Diagnosis not present

## 2024-02-06 DIAGNOSIS — R339 Retention of urine, unspecified: Secondary | ICD-10-CM | POA: Diagnosis not present

## 2024-02-10 DIAGNOSIS — R338 Other retention of urine: Secondary | ICD-10-CM | POA: Diagnosis not present

## 2024-02-10 DIAGNOSIS — R339 Retention of urine, unspecified: Secondary | ICD-10-CM | POA: Diagnosis not present

## 2024-02-10 DIAGNOSIS — N401 Enlarged prostate with lower urinary tract symptoms: Secondary | ICD-10-CM | POA: Diagnosis not present

## 2024-02-21 DIAGNOSIS — M25552 Pain in left hip: Secondary | ICD-10-CM | POA: Diagnosis not present

## 2024-02-21 DIAGNOSIS — Z96642 Presence of left artificial hip joint: Secondary | ICD-10-CM | POA: Diagnosis not present

## 2024-02-23 DIAGNOSIS — M25552 Pain in left hip: Secondary | ICD-10-CM | POA: Diagnosis not present

## 2024-02-23 DIAGNOSIS — Z96642 Presence of left artificial hip joint: Secondary | ICD-10-CM | POA: Diagnosis not present

## 2024-02-29 DIAGNOSIS — Z96642 Presence of left artificial hip joint: Secondary | ICD-10-CM | POA: Diagnosis not present

## 2024-02-29 DIAGNOSIS — M25552 Pain in left hip: Secondary | ICD-10-CM | POA: Diagnosis not present

## 2024-03-01 DIAGNOSIS — Z96642 Presence of left artificial hip joint: Secondary | ICD-10-CM | POA: Diagnosis not present

## 2024-03-01 DIAGNOSIS — Z96643 Presence of artificial hip joint, bilateral: Secondary | ICD-10-CM | POA: Diagnosis not present

## 2024-03-01 DIAGNOSIS — Z471 Aftercare following joint replacement surgery: Secondary | ICD-10-CM | POA: Diagnosis not present

## 2024-03-05 DIAGNOSIS — M25552 Pain in left hip: Secondary | ICD-10-CM | POA: Diagnosis not present

## 2024-03-05 DIAGNOSIS — Z96642 Presence of left artificial hip joint: Secondary | ICD-10-CM | POA: Diagnosis not present

## 2024-03-08 DIAGNOSIS — M25552 Pain in left hip: Secondary | ICD-10-CM | POA: Diagnosis not present

## 2024-03-08 DIAGNOSIS — Z96642 Presence of left artificial hip joint: Secondary | ICD-10-CM | POA: Diagnosis not present

## 2024-03-15 DIAGNOSIS — Z96642 Presence of left artificial hip joint: Secondary | ICD-10-CM | POA: Diagnosis not present

## 2024-03-15 DIAGNOSIS — M25552 Pain in left hip: Secondary | ICD-10-CM | POA: Diagnosis not present

## 2024-03-20 DIAGNOSIS — Z96642 Presence of left artificial hip joint: Secondary | ICD-10-CM | POA: Diagnosis not present

## 2024-03-20 DIAGNOSIS — M25552 Pain in left hip: Secondary | ICD-10-CM | POA: Diagnosis not present

## 2024-03-22 DIAGNOSIS — M25552 Pain in left hip: Secondary | ICD-10-CM | POA: Diagnosis not present

## 2024-03-22 DIAGNOSIS — Z96642 Presence of left artificial hip joint: Secondary | ICD-10-CM | POA: Diagnosis not present

## 2024-03-30 DIAGNOSIS — M25552 Pain in left hip: Secondary | ICD-10-CM | POA: Diagnosis not present

## 2024-03-30 DIAGNOSIS — Z96642 Presence of left artificial hip joint: Secondary | ICD-10-CM | POA: Diagnosis not present

## 2024-06-12 ENCOUNTER — Telehealth: Payer: Self-pay | Admitting: Family Medicine

## 2024-06-12 NOTE — Telephone Encounter (Signed)
 Please call pt and let him know he is due for a follow up appt, in order to have another sleep study. Facility requires last office visit note stating the use of CPAP. No recent documentation of it since he's been seen for acute visits. Previous sleep study is old.

## 2024-06-12 NOTE — Telephone Encounter (Signed)
 Appt sch'd for Dec 9th with Dr Glenard

## 2024-06-12 NOTE — Telephone Encounter (Signed)
 Copied from CRM [id number]. Topic: Clinical - Order For Equipment >> Jun 12, 2024 10:10 AM Kevelyn M wrote: Reason for CRM: Patient needs order faxed to [facility]  for a new C-PAP.  Phone number: [phone] Fax:[phone]

## 2024-07-03 ENCOUNTER — Ambulatory Visit (INDEPENDENT_AMBULATORY_CARE_PROVIDER_SITE_OTHER): Admitting: Family Medicine

## 2024-07-03 ENCOUNTER — Encounter: Payer: Self-pay | Admitting: Family Medicine

## 2024-07-03 VITALS — BP 126/74 | HR 79 | Resp 16 | Ht 70.0 in | Wt 192.7 lb

## 2024-07-03 DIAGNOSIS — G4733 Obstructive sleep apnea (adult) (pediatric): Secondary | ICD-10-CM

## 2024-07-03 DIAGNOSIS — F325 Major depressive disorder, single episode, in full remission: Secondary | ICD-10-CM

## 2024-07-03 DIAGNOSIS — L608 Other nail disorders: Secondary | ICD-10-CM | POA: Diagnosis not present

## 2024-07-03 DIAGNOSIS — R7303 Prediabetes: Secondary | ICD-10-CM | POA: Diagnosis not present

## 2024-07-03 DIAGNOSIS — I700 Atherosclerosis of aorta: Secondary | ICD-10-CM

## 2024-07-03 DIAGNOSIS — N528 Other male erectile dysfunction: Secondary | ICD-10-CM | POA: Diagnosis not present

## 2024-07-03 MED ORDER — SILDENAFIL CITRATE 50 MG PO TABS: 50.0000 mg | ORAL_TABLET | Freq: Every day | ORAL | 0 refills | Status: AC | PRN

## 2024-07-03 NOTE — Progress Notes (Signed)
 Name: Daniel Garner   MRN: [id number]    DOB: 10/22/1955   Date:07/03/2024       Progress Note  Subjective  Chief Complaint  Chief Complaint  Patient presents with   Medical Management of Chronic Issues    Needs another sleep study   Discussed the use of AI scribe software for clinical note transcription with the patient, who gave verbal consent to proceed.  History of Present Illness Daniel Garner is a 68 year old male who presents with toenail deformity and CPAP machine update.  He reports changes in his right toenail, first noticed around May 2025, before his hernia surgery. The toenail is thick and difficult to trim, and he has tried filing it down. A similar issue on his left foot resolved after a toenail broke off. He has not received treatment for a fungal infection in the past.  He is seeking to update his CPAP machine as his insurance covers it before the year's end. He has used a CPAP machine for about 19-20 years, with the current machine in use for approximately 9 years. He uses the CPAP machine nightly and does not experience snoring or daytime fatigue unless his nasal plugs are blocked.  He has a history of prediabetes, with blood sugar levels improving from 5.7 to 5.6 due to dietary changes. No increased hunger, thirst, or frequent urination, though these symptoms 'come and go'.  He reports a history of high cholesterol, with previous LDL values between 128 and 140, and most recently 84, and he has not taken cholesterol medication. He maintains an active lifestyle and has not been on cholesterol medication.  He underwent total hip arthroplasty on the left hip in July 2025 and reports no pain post-surgery. He previously had the right hip replaced and experienced urinary retention post-surgery, which has since resolved.  He has a history of major depression, which he states is no longer an issue. He also mentions a past episode of left lower back pain related to work  activities, which is no longer a problem.  He takes Viagra  50 mg for erectile dysfunction and requests a refill. He also takes vitamin C, B12, D, and E.    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   Sleep apnea 08/16/2016   History of migraine 08/16/2016   ED (erectile dysfunction) 08/16/2016   GERD without esophagitis 08/16/2016   Prostatism 01/13/2015    Past Surgical History:  Procedure Laterality Date   COLONOSCOPY  07/26/1998   COLONOSCOPY WITH PROPOFOL  N/A 08/06/2015   Procedure: COLONOSCOPY WITH PROPOFOL ;  Surgeon: Louanne KANDICE Muse, MD;  Location: [facility] ENDOSCOPY;  Service: Endoscopy;  Laterality: N/A;   COLONOSCOPY WITH PROPOFOL  N/A 09/02/2021   Procedure: COLONOSCOPY WITH PROPOFOL ;  Surgeon: Therisa Bi, MD;  Location: [facility] ENDOSCOPY;  Service: Gastroenterology;  Laterality: N/A;   HERNIA REPAIR  [id number]   INSERTION OF MESH  09/03/2021   Procedure: INSERTION OF MESH;  Surgeon: Desiderio Schanz, MD;  Location: [facility] ORS;  Service: General;;   POLYPECTOMY     TONSILLECTOMY     TOTAL HIP ARTHROPLASTY  Right 12/27/2022   Procedure: TOTAL HIP ARTHROPLASTY;  Surgeon: Mardee Lynwood SQUIBB, MD;  Location: [facility] ORS;  Service: Orthopedics;  Laterality: Right;   UMBILICAL HERNIA REPAIR N/A 09/03/2021   Procedure: HERNIA REPAIR UMBILICAL ADULT, incarcerated, open;  Surgeon: Desiderio Schanz, MD;  Location: [facility] ORS;  Service: General;  Laterality: N/A;    Family History  Problem Relation Age of Onset   Arthritis Mother    Colon cancer Father    Prostate cancer Father    Lung  cancer Father     Social History   Tobacco Use   Smoking status: Never   Smokeless tobacco: Never  Substance Use Topics   Alcohol use: Not Currently    Comment: > 38 yrs     Current Outpatient Medications:    Ascorbic Acid (VITAMIN C PO), Take 1 tablet by mouth daily. With D3 and Zinc , Disp: , Rfl:    Cyanocobalamin  (B-12 PO), Take 1 capsule by mouth daily., Disp: , Rfl:    sildenafil  (VIAGRA ) 50 MG tablet, Take 1 tablet (50 mg total) by mouth daily as needed for erectile dysfunction., Disp: 90 tablet, Rfl: 0   VITAMIN D  PO, Take 1 capsule by mouth daily., Disp: , Rfl:    VITAMIN E PO, Take 1-2 capsules by mouth daily., Disp: , Rfl:    celecoxib  (CELEBREX ) 200 MG capsule, Take 200 mg by mouth 2 (two) times daily. (Patient not taking: Reported on 07/03/2024), Disp: , Rfl:   No Known Allergies  I personally reviewed active problem list, medication list, allergies with the patient/caregiver today.   ROS  Ten systems reviewed and is negative except as mentioned in HPI    Objective Physical Exam CONSTITUTIONAL: Patient appears well-developed and well-nourished. No distress. HEENT: Head atraumatic, normocephalic, neck supple. CARDIOVASCULAR: Normal rate, regular rhythm and normal heart sounds. No murmur heard. No BLE edema. PULMONARY: Effort normal and breath sounds normal. Lungs clear to auscultation bilaterally. No respiratory distress. ABDOMINAL: There is no tenderness or distention. MUSCULOSKELETAL: Normal gait. Without gross motor or sensory deficit. PSYCHIATRIC: Patient has a normal mood and affect. Behavior is normal. Judgment and thought content normal.     Vitals:   07/03/24 1255  BP: 126/74  Pulse: 79  Resp: 16  SpO2: 98%  Weight: 192 lb 11.2 oz (87.4 kg)  Height: 5' 10 (1.778 m)    Body mass index is 27.65 kg/m.    PHQ2/9:    07/03/2024   12:52 PM 12/21/2023   11:16 AM 10/18/2023    2:44 PM 08/15/2023    1:01 PM 11/16/2022   10:24 AM  Depression  screen PHQ 2/9  Decreased Interest 0 0 0 0 0  Down, Depressed, Hopeless 0 0 0 0 0  PHQ - 2 Score 0 0 0 0 0  Altered sleeping 0   0 0  Tired, decreased energy 0   0 0  Change in appetite 0   0 0  Feeling bad or failure about yourself  0   0 0  Trouble concentrating 0   0 0  Moving slowly or fidgety/restless 0   0 0  Suicidal thoughts 0   0 0  PHQ-9 Score 0   0  0   Difficult doing work/chores Not difficult at all         Data saved with a previous flowsheet row definition    phq 9 is negative  Fall Risk:    07/03/2024   12:51 PM 12/21/2023   11:16 AM  10/18/2023    2:44 PM 11/16/2022   10:24 AM 03/17/2022    1:40 PM  Fall Risk   Falls in the past year? 0 0 0 0 0  Number falls in past yr: 0 0 0 0 0  Injury with Fall? 0 0  0  0  0   Risk for fall due to : No Fall Risks No Fall Risks No Fall Risks No Fall Risks No Fall Risks  Follow up Falls evaluation completed Falls prevention discussed;Education provided;Falls evaluation completed Falls evaluation completed Falls prevention discussed Falls prevention discussed      Data saved with a previous flowsheet row definition     Assessment & Plan Acquired deformity of toenail Chronic deformity with thickening and discoloration, likely from trauma or fungal infection. Liver enzymes normal for potential antifungal use. - Referred to podiatrist for toenail management. - Consider antifungal medication if necessary.  Obstructive sleep apnea Managed with CPAP for 19 years. No snoring or daytime fatigue. Needs updated sleep study for insurance. So he can order new supplies - Referred for sleep study at [facility] for titration.  Erectile dysfunction Chronic condition managed with Viagra  50 mg. - Prescribed Viagra  50 mg, 8 tablets.  Pre-diabetes HbA1c improved from 5.7 to 5.6 due to dietary changes. No hyperglycemia symptoms. - Continue dietary modifications.  Atherosclerosis of aorta Atherosclerosis with improved LDL to 84 through  lifestyle changes. No need for medication unless symptoms arise. - Continue lifestyle modifications.

## 2024-07-31 ENCOUNTER — Ambulatory Visit: Admitting: Podiatry

## 2024-07-31 ENCOUNTER — Ambulatory Visit (INDEPENDENT_AMBULATORY_CARE_PROVIDER_SITE_OTHER): Admitting: Podiatry

## 2024-07-31 DIAGNOSIS — B351 Tinea unguium: Secondary | ICD-10-CM

## 2024-07-31 DIAGNOSIS — M79674 Pain in right toe(s): Secondary | ICD-10-CM | POA: Diagnosis not present

## 2024-07-31 DIAGNOSIS — M79675 Pain in left toe(s): Secondary | ICD-10-CM

## 2024-07-31 NOTE — Progress Notes (Signed)
" ° °  Chief Complaint  Patient presents with   Nail Problem    New pt-right foot toe nails  2/3/4 real dark black and on the left the great toes is thick and discolored.    SUBJECTIVE Patient presents to office today complaining of elongated, thickened nails that cause pain while ambulating in shoes.  Patient is unable to trim their own nails. Patient is here for further evaluation and treatment.  Past Medical History:  Diagnosis Date   Anxiety    Aortic atherosclerosis    Arthritis    BPH with obstruction/lower urinary tract symptoms 06/15/2015   COVID 2020   Depression    Erectile dysfunction    a.) on PDE5i (sildenafil )   GERD (gastroesophageal reflux disease)    History of kidney stones    HLD (hyperlipidemia)    HTN (hypertension)    Migraines    Nephrolithiasis    OSA on CPAP    Prostatism 01/13/2015    Allergies[1]   OBJECTIVE General Patient is awake, alert, and oriented x 3 and in no acute distress. Derm Skin is dry and supple bilateral. Negative open lesions or macerations. Remaining integument unremarkable. Nails are tender, long, thickened and dystrophic with subungual debris, consistent with onychomycosis, 1-5 bilateral. No signs of infection noted. Vasc  DP and PT pedal pulses palpable bilaterally. Temperature gradient within normal limits.  Neuro Epicritic and protective threshold sensation grossly intact bilaterally.  Musculoskeletal Exam No symptomatic pedal deformities noted bilateral. Muscular strength within normal limits.  ASSESSMENT 1.  Pain due to onychomycosis of toenails both  PLAN OF CARE 1. Patient evaluated today.  2. Instructed to maintain good pedal hygiene and foot care.  3. Mechanical debridement of nails 1-5 bilaterally performed using a nail nipper. Filed with dremel without incident.  4. Return to clinic in 3 mos.    Thresa EMERSON Sar, DPM [facility]  Dr. Thresa EMERSON Sar, DPM    [address] [place], [place] [address]                Office [phone]  Fax [phone]        [1] No Known Allergies  "

## 2025-01-01 ENCOUNTER — Ambulatory Visit: Admitting: Family Medicine
